# Patient Record
Sex: Female | Born: 1990 | Race: Black or African American | Hispanic: No | Marital: Single | Smoking: Never smoker
Health system: Southern US, Community
[De-identification: ages and names within clinical notes are randomized; demographics above are authoritative.]

## PROBLEM LIST (undated history)

## (undated) ENCOUNTER — Inpatient Hospital Stay (HOSPITAL_COMMUNITY): Payer: Self-pay

## (undated) DIAGNOSIS — D649 Anemia, unspecified: Secondary | ICD-10-CM

## (undated) DIAGNOSIS — F322 Major depressive disorder, single episode, severe without psychotic features: Secondary | ICD-10-CM

## (undated) DIAGNOSIS — Z801 Family history of malignant neoplasm of trachea, bronchus and lung: Secondary | ICD-10-CM

## (undated) DIAGNOSIS — F411 Generalized anxiety disorder: Secondary | ICD-10-CM

## (undated) DIAGNOSIS — Z800 Family history of malignant neoplasm of digestive organs: Secondary | ICD-10-CM

## (undated) DIAGNOSIS — I10 Essential (primary) hypertension: Secondary | ICD-10-CM

## (undated) DIAGNOSIS — Z803 Family history of malignant neoplasm of breast: Secondary | ICD-10-CM

## (undated) DIAGNOSIS — A749 Chlamydial infection, unspecified: Secondary | ICD-10-CM

## (undated) DIAGNOSIS — A549 Gonococcal infection, unspecified: Secondary | ICD-10-CM

## (undated) HISTORY — DX: Gonococcal infection, unspecified: A54.9

## (undated) HISTORY — DX: Major depressive disorder, single episode, severe without psychotic features: F32.2

## (undated) HISTORY — DX: Essential (primary) hypertension: I10

## (undated) HISTORY — DX: Family history of malignant neoplasm of trachea, bronchus and lung: Z80.1

## (undated) HISTORY — PX: CERVIX SURGERY: SHX593

## (undated) HISTORY — DX: Family history of malignant neoplasm of breast: Z80.3

## (undated) HISTORY — DX: Family history of malignant neoplasm of digestive organs: Z80.0

## (undated) HISTORY — DX: Chlamydial infection, unspecified: A74.9

## (undated) HISTORY — PX: NO PAST SURGERIES: SHX2092

## (undated) HISTORY — DX: Generalized anxiety disorder: F41.1

---

## 2014-12-07 ENCOUNTER — Inpatient Hospital Stay (HOSPITAL_COMMUNITY): Payer: Self-pay

## 2014-12-07 ENCOUNTER — Inpatient Hospital Stay (HOSPITAL_COMMUNITY)
Admission: AD | Admit: 2014-12-07 | Discharge: 2014-12-07 | Disposition: A | Discharge status: Home / Self Care | Payer: Self-pay | Source: Ambulatory Visit | Attending: Obstetrics and Gynecology | Admitting: Obstetrics and Gynecology

## 2014-12-07 ENCOUNTER — Encounter (HOSPITAL_COMMUNITY): Payer: Self-pay | Admitting: *Deleted

## 2014-12-07 DIAGNOSIS — O039 Complete or unspecified spontaneous abortion without complication: Secondary | ICD-10-CM | POA: Insufficient documentation

## 2014-12-07 DIAGNOSIS — Z3A08 8 weeks gestation of pregnancy: Secondary | ICD-10-CM | POA: Insufficient documentation

## 2014-12-07 DIAGNOSIS — O209 Hemorrhage in early pregnancy, unspecified: Secondary | ICD-10-CM

## 2014-12-07 HISTORY — DX: Anemia, unspecified: D64.9

## 2014-12-07 LAB — CBC
HEMATOCRIT: 35.9 % — AB (ref 36.0–46.0)
Hemoglobin: 11.6 g/dL — ABNORMAL LOW (ref 12.0–15.0)
MCH: 23.3 pg — ABNORMAL LOW (ref 26.0–34.0)
MCHC: 32.3 g/dL (ref 30.0–36.0)
MCV: 72.2 fL — ABNORMAL LOW (ref 78.0–100.0)
PLATELETS: 288 10*3/uL (ref 150–400)
RBC: 4.97 MIL/uL (ref 3.87–5.11)
RDW: 15.5 % (ref 11.5–15.5)
WBC: 11.6 10*3/uL — AB (ref 4.0–10.5)

## 2014-12-07 LAB — WET PREP, GENITAL
TRICH WET PREP: NONE SEEN
Yeast Wet Prep HPF POC: NONE SEEN

## 2014-12-07 LAB — URINALYSIS, ROUTINE W REFLEX MICROSCOPIC
Bilirubin Urine: NEGATIVE
Glucose, UA: NEGATIVE mg/dL
KETONES UR: 15 mg/dL — AB
LEUKOCYTES UA: NEGATIVE
NITRITE: NEGATIVE
Protein, ur: NEGATIVE mg/dL
Urobilinogen, UA: 1 mg/dL (ref 0.0–1.0)
pH: 6 (ref 5.0–8.0)

## 2014-12-07 LAB — BASIC METABOLIC PANEL
Anion gap: 6 (ref 5–15)
BUN: 9 mg/dL (ref 6–23)
CALCIUM: 9.5 mg/dL (ref 8.4–10.5)
CHLORIDE: 104 meq/L (ref 96–112)
CO2: 27 mmol/L (ref 19–32)
Creatinine, Ser: 0.7 mg/dL (ref 0.50–1.10)
GFR calc Af Amer: >90 mL/min (ref >90)
GLUCOSE: 94 mg/dL (ref 70–99)
POTASSIUM: 4.3 mmol/L (ref 3.5–5.1)
Sodium: 137 mmol/L (ref 135–145)

## 2014-12-07 LAB — URINE MICROSCOPIC-ADD ON

## 2014-12-07 LAB — HCG, QUANTITATIVE, PREGNANCY: HCG, BETA CHAIN, QUANT, S: 11624 m[IU]/mL — AB (ref <5)

## 2014-12-07 LAB — POCT PREGNANCY, URINE: Preg Test, Ur: POSITIVE — AB

## 2014-12-07 MED ORDER — PROMETHAZINE HCL 12.5 MG PO TABS: 12.5000 mg | ORAL_TABLET | Freq: Four times a day (QID) | ORAL | Status: DC | PRN

## 2014-12-07 MED ORDER — OXYCODONE-ACETAMINOPHEN 5-325 MG PO TABS: 1.0000 | ORAL_TABLET | Freq: Four times a day (QID) | ORAL | Status: DC | PRN

## 2014-12-07 MED ORDER — OXYCODONE-ACETAMINOPHEN 5-325 MG PO TABS
ORAL_TABLET | ORAL | Status: AC
  Filled 2014-12-07: qty 1

## 2014-12-07 MED ORDER — IBUPROFEN 800 MG PO TABS: 800.0000 mg | ORAL_TABLET | Freq: Three times a day (TID) | ORAL | Status: DC

## 2014-12-07 MED ORDER — OXYCODONE-ACETAMINOPHEN 5-325 MG PO TABS
1.0000 | ORAL_TABLET | Freq: Once | ORAL | Status: AC
  Administered 2014-12-07: 1 via ORAL

## 2014-12-07 NOTE — MAU Note (Signed)
Started spotting yesterday, today has gotten heavier. On going cramping.

## 2014-12-07 NOTE — Discharge Instructions (Signed)
Incomplete Miscarriage A miscarriage is the sudden loss of an unborn baby (fetus) before the 20th week of pregnancy. In an incomplete miscarriage, parts of the fetus or placenta (afterbirth) remain in the body.  Having a miscarriage can be an emotional experience. Talk with your health care provider about any questions you may have about miscarrying, the grieving process, and your future pregnancy plans. CAUSES   Problems with the fetal chromosomes that make it impossible for the baby to develop normally. Problems with the baby's genes or chromosomes are most often the result of errors that occur by chance as the embryo divides and grows. The problems are not inherited from the parents.  Infection of the cervix or uterus.  Hormone problems.  Problems with the cervix, such as having an incompetent cervix. This is when the tissue in the cervix is not strong enough to hold the pregnancy.  Problems with the uterus, such as an abnormally shaped uterus, uterine fibroids, or congenital abnormalities.  Certain medical conditions.  Smoking, drinking alcohol, or taking illegal drugs.  Trauma. SYMPTOMS   Vaginal bleeding or spotting, with or without cramps or pain.  Pain or cramping in the abdomen or lower back.  Passing fluid, tissue, or blood clots from the vagina. DIAGNOSIS  Your health care provider will perform a physical exam. You may also have an ultrasound to confirm the miscarriage. Blood or urine tests may also be ordered. TREATMENT   Usually, a dilation and curettage (D&C) procedure is performed. During a D&C procedure, the cervix is widened (dilated) and any remaining fetal or placental tissue is gently removed from the uterus.  Antibiotic medicines are prescribed if there is an infection. Other medicines may be given to reduce the size of the uterus (contract) if there is a lot of bleeding.  If you have Rh negative blood and your baby was Rh positive, you will need a Rho (D)  immune globulin shot. This shot will protect any future baby from having Rh blood problems in future pregnancies.  You may be confined to bed rest. This means you should stay in bed and only get up to use the bathroom. HOME CARE INSTRUCTIONS   Rest as directed by your health care provider.  Restrict activity as directed by your health care provider. You may be allowed to continue light activity if curettage was not done but you require further treatment.  Keep track of the number of pads you use each day. Keep track of how soaked (saturated) they are. Record this information.  Do not  use tampons.  Do not douche or have sexual intercourse until approved by your health care provider.  Keep all follow-up appointments for reevaluation and continuing management.  Only take over-the-counter or prescription medicines for pain, fever, or discomfort as directed by your health care provider.  Take antibiotic medicine as directed by your health care provider. Make sure you finish it even if you start to feel better. SEEK IMMEDIATE MEDICAL CARE IF:   You experience severe cramps in your stomach, back, or abdomen.  You have an unexplained temperature (make sure to record these temperatures).  You pass large clots or tissue (save these for your health care provider to inspect).  Your bleeding increases.  You become light-headed, weak, or have fainting episodes. MAKE SURE YOU:   Understand these instructions.  Will watch your condition.  Will get help right away if you are not doing well or get worse. Document Released: 11/20/2005 Document Revised: 04/06/2014 Document Reviewed:   06/19/2013 Exit[employer] Patient Information 2015 [employer]. This information is not intended to replace advice given to you by your health care provider. Make sure you discuss any questions you have with your health care provider.  

## 2014-12-07 NOTE — MAU Provider Note (Signed)
History     CSN: [id number]  Arrival date and time: 12/07/14 1647   First Provider Initiated Contact with Patient 12/07/14 1859      Chief Complaint  Patient presents with  . Vaginal Bleeding  . Abdominal Cramping  . Possible Pregnancy   HPI  [name] is a 24 y.o. G1P0 at [redacted]w[redacted]d who presents to MAU today with complaint of +HPT, lower abdominal pain and vaginal bleeding. The patient states that abdominal pain has been constant since she found out she was pregnant. She states worsening of pain today. She rates pain at 5/10 now. She states vaginal bleeding started today. She is wearing a pad and states bleeding is slightly less than a period. She also endorses a white discharge with odor. She has occasional nausea without vomiting or diarrhea. She states occasional constipation with last BM yesterday. She denies dysuria or fever.   OB History    Gravida Para Term Preterm AB TAB SAB Ectopic Multiple Living   1         0      Past Medical History  Diagnosis Date  . Anemia     Past Surgical History  Procedure Laterality Date  . No past surgeries      History reviewed. No pertinent family history.  History  Substance Use Topics  . Smoking status: Never Smoker   . Smokeless tobacco: Not on file  . Alcohol Use: Yes     Comment: soccially    Allergies: No Known Allergies  Prescriptions prior to admission  Medication Sig Dispense Refill Last Dose  . acetaminophen (TYLENOL) 500 MG tablet Take 500 mg by mouth every 6 (six) hours as needed for headache.   Past Week at Unknown time  . Prenatal Vit-Fe Fumarate-FA (PRENATAL MULTIVITAMIN) TABS tablet Take 1 tablet by mouth daily at 12 noon.   12/07/2014 at Unknown time    Review of Systems  Constitutional: Negative for fever and malaise/fatigue.  Gastrointestinal: Positive for nausea, abdominal pain and constipation. Negative for vomiting and diarrhea.  Genitourinary: Negative for dysuria, urgency and frequency.       +  vaginal bleeding, discharge   Physical Exam   Blood pressure 140/80, pulse 85, temperature 99.1 F (37.3 C), temperature source Oral, resp. rate 18, weight 155 lb (70.308 kg), last menstrual period 10/11/2014.  Physical Exam  Constitutional: She is oriented to person, place, and time. She appears well-developed and well-nourished. No distress.  HENT:  Head: Normocephalic.  Cardiovascular: Normal rate.   Respiratory: Effort normal.  GI: Soft. Bowel sounds are normal. She exhibits no distension and no mass. There is tenderness (mild tenderness to palpation of the lower abdomen). There is no rebound and no guarding.  Genitourinary: Uterus is enlarged (slightly) and tender (mild). Cervix exhibits no motion tenderness, no discharge and no friability. Right adnexum displays no mass and no tenderness. Left adnexum displays no mass and no tenderness. There is bleeding (small amount of blood in the vaginal vault) in the vagina. No vaginal discharge found.  Neurological: She is alert and oriented to person, place, and time.  Skin: Skin is warm and dry. No erythema.  Psychiatric: She has a normal mood and affect.   Results for orders placed or performed during the hospital encounter of 12/07/14 (from the past 24 hour(s))  Pregnancy, urine POC     Status: Abnormal   Collection Time: 12/07/14  5:29 PM  Result Value Ref Range   Preg Test, Ur POSITIVE (A) NEGATIVE  Urinalysis, Routine w reflex microscopic     Status: Abnormal   Collection Time: 12/07/14  5:30 PM  Result Value Ref Range   Color, Urine YELLOW YELLOW   APPearance CLEAR CLEAR   Specific Gravity, Urine >1.030 (H) 1.005 - 1.030   pH 6.0 5.0 - 8.0   Glucose, UA NEGATIVE NEGATIVE mg/dL   Hgb urine dipstick MODERATE (A) NEGATIVE   Bilirubin Urine NEGATIVE NEGATIVE   Ketones, ur 15 (A) NEGATIVE mg/dL   Protein, ur NEGATIVE NEGATIVE mg/dL   Urobilinogen, UA 1.0 0.0 - 1.0 mg/dL   Nitrite NEGATIVE NEGATIVE   Leukocytes, UA NEGATIVE  NEGATIVE  Urine microscopic-add on     Status: Abnormal   Collection Time: 12/07/14  5:30 PM  Result Value Ref Range   Squamous Epithelial / LPF FEW (A) RARE   WBC, UA 0-2 <3 WBC/hpf   RBC / HPF 0-2 <3 RBC/hpf   Crystals CA OXALATE CRYSTALS (A) NEGATIVE   Urine-Other MUCOUS PRESENT   hCG, quantitative, pregnancy     Status: Abnormal   Collection Time: 12/07/14  6:39 PM  Result Value Ref Range   hCG, Beta Chain, Quant, S 11624 (H) <5 mIU/mL  CBC     Status: Abnormal   Collection Time: 12/07/14  6:40 PM  Result Value Ref Range   WBC 11.6 (H) 4.0 - 10.5 K/uL   RBC 4.97 3.87 - 5.11 MIL/uL   Hemoglobin 11.6 (L) 12.0 - 15.0 g/dL   HCT 29.5 (L) 28.4 - 13.2 %   MCV 72.2 (L) 78.0 - 100.0 fL   MCH 23.3 (L) 26.0 - 34.0 pg   MCHC 32.3 30.0 - 36.0 g/dL   RDW 44.0 10.2 - 72.5 %   Platelets 288 150 - 400 K/uL  Basic metabolic panel     Status: None   Collection Time: 12/07/14  6:40 PM  Result Value Ref Range   Sodium 137 135 - 145 mmol/L   Potassium 4.3 3.5 - 5.1 mmol/L   Chloride 104 96 - 112 mEq/L   CO2 27 19 - 32 mmol/L   Glucose, Bld 94 70 - 99 mg/dL   BUN 9 6 - 23 mg/dL   Creatinine, Ser 3.66 0.50 - 1.10 mg/dL   Calcium 9.5 8.4 - 44.0 mg/dL   GFR calc non Af Amer >90 >90 mL/min   GFR calc Af Amer >90 >90 mL/min   Anion gap 6 5 - 15  Wet prep, genital     Status: Abnormal   Collection Time: 12/07/14  7:14 PM  Result Value Ref Range   Yeast Wet Prep HPF POC NONE SEEN NONE SEEN   Trich, Wet Prep NONE SEEN NONE SEEN   Clue Cells Wet Prep HPF POC FEW (A) NONE SEEN   WBC, Wet Prep HPF POC MODERATE (A) NONE SEEN   US Ob Comp Less 14 Wks  12/07/2014   CLINICAL DATA:  Acute onset of vaginal bleeding.  Initial encounter.  EXAM: OBSTETRIC <14 WK [place] AND TRANSVAGINAL OB US  TECHNIQUE: Both transabdominal and transvaginal ultrasound examinations were performed for complete evaluation of the gestation as well as the maternal uterus, adnexal regions, and pelvic cul-de-sac. Transvaginal technique  was performed to assess early pregnancy.  COMPARISON:  None.  FINDINGS: Intrauterine gestational sac: Visualized/normal in shape. The gestational sac is seen at the lower uterine segment.  Yolk sac:  Yes  Embryo:  Yes  Cardiac Activity: No  Heart Rate: N/A  CRL:   1.0 cm  7 w 1 d                  [place] EDC: 07/25/2015  Maternal uterus/adnexae: No subchorionic hemorrhage is noted. The uterus is otherwise unremarkable in appearance.  The ovaries are grossly unremarkable. The right ovary measures 2.6 x 2.4 x 2.3 cm, while the left ovary measures 2.7 x 1.7 x 1.8 cm. No suspicious adnexal masses are seen; there is no evidence for ovarian torsion.  No free fluid is seen within the pelvic cul-de-sac.  IMPRESSION: Single intrauterine gestational sac noted. The gestational sac is seen at the lower uterine segment, compatible with spontaneous abortion in progress. No cardiac activity is visualized, reflecting fetal demise.   Electronically Signed   By: Roanna Raider M.D.   On: 12/07/2014 20:13   US Ob Transvaginal  12/07/2014   CLINICAL DATA:  Acute onset of vaginal bleeding.  Initial encounter.  EXAM: OBSTETRIC <14 WK [place] AND TRANSVAGINAL OB US  TECHNIQUE: Both transabdominal and transvaginal ultrasound examinations were performed for complete evaluation of the gestation as well as the maternal uterus, adnexal regions, and pelvic cul-de-sac. Transvaginal technique was performed to assess early pregnancy.  COMPARISON:  None.  FINDINGS: Intrauterine gestational sac: Visualized/normal in shape. The gestational sac is seen at the lower uterine segment.  Yolk sac:  Yes  Embryo:  Yes  Cardiac Activity: No  Heart Rate: N/A  CRL:   1.0 cm   7 w 1 d                  [place] EDC: 07/25/2015  Maternal uterus/adnexae: No subchorionic hemorrhage is noted. The uterus is otherwise unremarkable in appearance.  The ovaries are grossly unremarkable. The right ovary measures 2.6 x 2.4 x 2.3 cm, while the left ovary measures 2.7 x 1.7 x 1.8 cm. No  suspicious adnexal masses are seen; there is no evidence for ovarian torsion.  No free fluid is seen within the pelvic cul-de-sac.  IMPRESSION: Single intrauterine gestational sac noted. The gestational sac is seen at the lower uterine segment, compatible with spontaneous abortion in progress. No cardiac activity is visualized, reflecting fetal demise.   Electronically Signed   By: Roanna Raider M.D.   On: 12/07/2014 20:13    MAU Course  Procedures None  MDM +UPT UA, wet prep, GC/Chlamydia, CBC, ABO/Rh, quant hCG, HIV, RPR and [place] today Discussed results with patient as well as options for expectant management vs Cytotec vs D&C.  Patient prefers expectant management at this time.  Assessment and Plan  A: SAB in progress  P: Discharge home Rx for Percocet, Phenergan and Ibuprofen given to patient Bleeding precautions discussed Patient referred to [facility] for follow-up in 2 weeks. They will call with appt date/time.  Patient may return to MAU as needed or if her condition were to change or worsen   Marny Lowenstein, PA-C  12/07/2014, 8:53 PM

## 2014-12-08 LAB — HIV ANTIBODY (ROUTINE TESTING W REFLEX): HIV 1&2 Ab, 4th Generation: NONREACTIVE

## 2014-12-08 LAB — RPR

## 2014-12-09 LAB — GC/CHLAMYDIA PROBE AMP
CT Probe RNA: NEGATIVE
GC Probe RNA: NEGATIVE

## 2014-12-24 ENCOUNTER — Telehealth: Payer: Self-pay | Admitting: General Practice

## 2014-12-24 ENCOUNTER — Telehealth: Payer: Self-pay | Admitting: Family Medicine

## 2014-12-24 NOTE — Telephone Encounter (Signed)
Called patient informed her that office would be closed and that we would be calling her back with new appointment.

## 2014-12-24 NOTE — Telephone Encounter (Signed)
Called patient informing her that our office is going to be closed tomorrow however we have already rescheduled her appt for Monday the 25th at 2pm. Patient verbalized understanding and states she cannot do that time but needs something first thing in the morning. Informed patient of new appt of 1/25 @ 745. Patient verbalized understanding and had no other questions

## 2014-12-25 ENCOUNTER — Encounter: Payer: Self-pay | Admitting: Family Medicine

## 2014-12-28 ENCOUNTER — Encounter: Payer: Self-pay | Admitting: Family Medicine

## 2015-01-27 ENCOUNTER — Encounter: Payer: Self-pay | Admitting: Obstetrics & Gynecology

## 2015-01-27 ENCOUNTER — Ambulatory Visit (INDEPENDENT_AMBULATORY_CARE_PROVIDER_SITE_OTHER): Payer: 59 | Admitting: Obstetrics & Gynecology

## 2015-01-27 VITALS — BP 134/82 | HR 74 | Temp 98.5°F | Ht 67.5 in | Wt 157.7 lb

## 2015-01-27 DIAGNOSIS — Z309 Encounter for contraceptive management, unspecified: Secondary | ICD-10-CM

## 2015-01-27 DIAGNOSIS — Z3002 Counseling and instruction in natural family planning to avoid pregnancy: Secondary | ICD-10-CM

## 2015-01-27 MED ORDER — ETONOGESTREL-ETHINYL ESTRADIOL 0.12-0.015 MG/24HR VA RING: VAGINAL_RING | VAGINAL | Status: DC

## 2015-01-27 NOTE — Patient Instructions (Signed)
Contraception Choices Contraception (birth control) is the use of any methods or devices to prevent pregnancy. Below are some methods to help avoid pregnancy. HORMONAL METHODS   Contraceptive implant. This is a thin, plastic tube containing progesterone hormone. It does not contain estrogen hormone. Your health care provider inserts the tube in the inner part of the upper arm. The tube can remain in place for up to 3 years. After 3 years, the implant must be removed. The implant prevents the ovaries from releasing an egg (ovulation), thickens the cervical mucus to prevent sperm from entering the uterus, and thins the lining of the inside of the uterus.  Progesterone-only injections. These injections are given every 3 months by your health care provider to prevent pregnancy. This synthetic progesterone hormone stops the ovaries from releasing eggs. It also thickens cervical mucus and changes the uterine lining. This makes it harder for sperm to survive in the uterus.  Birth control pills. These pills contain estrogen and progesterone hormone. They work by preventing the ovaries from releasing eggs (ovulation). They also cause the cervical mucus to thicken, preventing the sperm from entering the uterus. Birth control pills are prescribed by a health care provider.Birth control pills can also be used to treat heavy periods.  Minipill. This type of birth control pill contains only the progesterone hormone. They are taken every day of each month and must be prescribed by your health care provider.  Birth control patch. The patch contains hormones similar to those in birth control pills. It must be changed once a week and is prescribed by a health care provider.  Vaginal ring. The ring contains hormones similar to those in birth control pills. It is left in the vagina for 3 weeks, removed for 1 week, and then a new one is put back in place. The patient must be comfortable inserting and removing the ring  from the vagina.A health care provider's prescription is necessary.  Emergency contraception. Emergency contraceptives prevent pregnancy after unprotected sexual intercourse. This pill can be taken right after sex or up to 5 days after unprotected sex. It is most effective the sooner you take the pills after having sexual intercourse. Most emergency contraceptive pills are available without a prescription. Check with your pharmacist. Do not use emergency contraception as your only form of birth control. BARRIER METHODS   Female condom. This is a thin sheath (latex or rubber) that is worn over the penis during sexual intercourse. It can be used with spermicide to increase effectiveness.  Female condom. This is a soft, loose-fitting sheath that is put into the vagina before sexual intercourse.  Diaphragm. This is a soft, latex, dome-shaped barrier that must be fitted by a health care provider. It is inserted into the vagina, along with a spermicidal jelly. It is inserted before intercourse. The diaphragm should be left in the vagina for 6 to 8 hours after intercourse.  Cervical cap. This is a round, soft, latex or plastic cup that fits over the cervix and must be fitted by a health care provider. The cap can be left in place for up to 48 hours after intercourse.  Sponge. This is a soft, circular piece of polyurethane foam. The sponge has spermicide in it. It is inserted into the vagina after wetting it and before sexual intercourse.  Spermicides. These are chemicals that kill or block sperm from entering the cervix and uterus. They come in the form of creams, jellies, suppositories, foam, or tablets. They do not require a   prescription. They are inserted into the vagina with an applicator before having sexual intercourse. The process must be repeated every time you have sexual intercourse. INTRAUTERINE CONTRACEPTION  Intrauterine device (IUD). This is a T-shaped device that is put in a woman's uterus  during a menstrual period to prevent pregnancy. There are 2 types:  Copper IUD. This type of IUD is wrapped in copper wire and is placed inside the uterus. Copper makes the uterus and fallopian tubes produce a fluid that kills sperm. It can stay in place for 10 years.  Hormone IUD. This type of IUD contains the hormone progestin (synthetic progesterone). The hormone thickens the cervical mucus and prevents sperm from entering the uterus, and it also thins the uterine lining to prevent implantation of a fertilized egg. The hormone can weaken or kill the sperm that get into the uterus. It can stay in place for 3-5 years, depending on which type of IUD is used. PERMANENT METHODS OF CONTRACEPTION  Female tubal ligation. This is when the woman's fallopian tubes are surgically sealed, tied, or blocked to prevent the egg from traveling to the uterus.  Hysteroscopic sterilization. This involves placing a small coil or insert into each fallopian tube. Your doctor uses a technique called hysteroscopy to do the procedure. The device causes scar tissue to form. This results in permanent blockage of the fallopian tubes, so the sperm cannot fertilize the egg. It takes about 3 months after the procedure for the tubes to become blocked. You must use another form of birth control for these 3 months.  Female sterilization. This is when the female has the tubes that carry sperm tied off (vasectomy).This blocks sperm from entering the vagina during sexual intercourse. After the procedure, the man can still ejaculate fluid (semen). NATURAL PLANNING METHODS  Natural family planning. This is not having sexual intercourse or using a barrier method (condom, diaphragm, cervical cap) on days the woman could become pregnant.  Calendar method. This is keeping track of the length of each menstrual cycle and identifying when you are fertile.  Ovulation method. This is avoiding sexual intercourse during ovulation.  Symptothermal  method. This is avoiding sexual intercourse during ovulation, using a thermometer and ovulation symptoms.  Post-ovulation method. This is timing sexual intercourse after you have ovulated. Regardless of which type or method of contraception you choose, it is important that you use condoms to protect against the transmission of sexually transmitted infections (STIs). Talk with your health care provider about which form of contraception is most appropriate for you. Document Released: 11/20/2005 Document Revised: 11/25/2013 Document Reviewed: 05/15/2013 Exit[employer] Patient Information 2015 [employer]. This information is not intended to replace advice given to you by your health care provider. Make sure you discuss any questions you have with your health care provider.  

## 2015-01-28 NOTE — Progress Notes (Signed)
Subjective:     Patient ID: Kim LeavellJonaa Reeves, female   DOB: January 09, 1991, 24 y.o.   MRN: [id number]  HPI Pt s/p SAB in 12/2014.  She reports that she has no sx. Her bleeding has improved and she has no pain.  She was prev on Nuvaring with no problems and would like to restart that.    Review of Systems     Objective:   Physical Exam BP 134/82 mmHg  Pulse 74  Temp(Src) 98.5 F (36.9 C)  Ht 5' 7.5" (1.715 m)  Wt 157 lb 11.2 oz (71.532 kg)  BMI 24.32 kg/m2  LMP 01/18/2015  Breastfeeding? Unknown Abd: soft, NT; ND     Assessment:     S/p SAB Contraception management- wants to restart Nuvaring       Plan:     Nuvaring- reviewed use F/u 1 year or sooner prn

## 2015-04-19 ENCOUNTER — Encounter (HOSPITAL_COMMUNITY): Payer: Self-pay | Admitting: Emergency Medicine

## 2015-04-19 ENCOUNTER — Emergency Department (HOSPITAL_COMMUNITY)
Admission: EM | Admit: 2015-04-19 | Discharge: 2015-04-19 | Disposition: A | Discharge status: Home / Self Care | Payer: 59 | Attending: Emergency Medicine | Admitting: Emergency Medicine

## 2015-04-19 ENCOUNTER — Emergency Department (HOSPITAL_COMMUNITY): Payer: 59

## 2015-04-19 DIAGNOSIS — Z791 Long term (current) use of non-steroidal anti-inflammatories (NSAID): Secondary | ICD-10-CM | POA: Insufficient documentation

## 2015-04-19 DIAGNOSIS — Y9389 Activity, other specified: Secondary | ICD-10-CM | POA: Insufficient documentation

## 2015-04-19 DIAGNOSIS — S301XXA Contusion of abdominal wall, initial encounter: Secondary | ICD-10-CM

## 2015-04-19 DIAGNOSIS — S99921A Unspecified injury of right foot, initial encounter: Secondary | ICD-10-CM | POA: Diagnosis not present

## 2015-04-19 DIAGNOSIS — S3991XA Unspecified injury of abdomen, initial encounter: Secondary | ICD-10-CM | POA: Diagnosis present

## 2015-04-19 DIAGNOSIS — Z79899 Other long term (current) drug therapy: Secondary | ICD-10-CM | POA: Diagnosis not present

## 2015-04-19 DIAGNOSIS — S8991XA Unspecified injury of right lower leg, initial encounter: Secondary | ICD-10-CM | POA: Diagnosis not present

## 2015-04-19 DIAGNOSIS — S79911A Unspecified injury of right hip, initial encounter: Secondary | ICD-10-CM | POA: Insufficient documentation

## 2015-04-19 DIAGNOSIS — S3993XA Unspecified injury of pelvis, initial encounter: Secondary | ICD-10-CM | POA: Insufficient documentation

## 2015-04-19 DIAGNOSIS — S60511A Abrasion of right hand, initial encounter: Secondary | ICD-10-CM | POA: Diagnosis not present

## 2015-04-19 DIAGNOSIS — S40022A Contusion of left upper arm, initial encounter: Secondary | ICD-10-CM | POA: Insufficient documentation

## 2015-04-19 DIAGNOSIS — Y92410 Unspecified street and highway as the place of occurrence of the external cause: Secondary | ICD-10-CM | POA: Diagnosis not present

## 2015-04-19 DIAGNOSIS — S6991XA Unspecified injury of right wrist, hand and finger(s), initial encounter: Secondary | ICD-10-CM | POA: Insufficient documentation

## 2015-04-19 DIAGNOSIS — D649 Anemia, unspecified: Secondary | ICD-10-CM | POA: Diagnosis not present

## 2015-04-19 DIAGNOSIS — Y998 Other external cause status: Secondary | ICD-10-CM | POA: Insufficient documentation

## 2015-04-19 DIAGNOSIS — T07XXXA Unspecified multiple injuries, initial encounter: Secondary | ICD-10-CM

## 2015-04-19 LAB — COMPREHENSIVE METABOLIC PANEL
ALT: 26 U/L (ref 14–54)
AST: 41 U/L (ref 15–41)
Albumin: 3.5 g/dL (ref 3.5–5.0)
Alkaline Phosphatase: 42 U/L (ref 38–126)
Anion gap: 11 (ref 5–15)
BILIRUBIN TOTAL: 1.1 mg/dL (ref 0.3–1.2)
BUN: 8 mg/dL (ref 6–20)
CALCIUM: 9 mg/dL (ref 8.9–10.3)
CO2: 22 mmol/L (ref 22–32)
Chloride: 106 mmol/L (ref 101–111)
Creatinine, Ser: 0.96 mg/dL (ref 0.44–1.00)
GLUCOSE: 85 mg/dL (ref 65–99)
Potassium: 4.3 mmol/L (ref 3.5–5.1)
Sodium: 139 mmol/L (ref 135–145)
Total Protein: 6.9 g/dL (ref 6.5–8.1)

## 2015-04-19 LAB — CBC WITH DIFFERENTIAL/PLATELET
BASOS PCT: 0 % (ref 0–1)
Basophils Absolute: 0 10*3/uL (ref 0.0–0.1)
EOS PCT: 0 % (ref 0–5)
Eosinophils Absolute: 0 10*3/uL (ref 0.0–0.7)
HEMATOCRIT: 40.1 % (ref 36.0–46.0)
Hemoglobin: 12.9 g/dL (ref 12.0–15.0)
LYMPHS ABS: 2 10*3/uL (ref 0.7–4.0)
LYMPHS PCT: 18 % (ref 12–46)
MCH: 22.8 pg — ABNORMAL LOW (ref 26.0–34.0)
MCHC: 32.2 g/dL (ref 30.0–36.0)
MCV: 70.7 fL — ABNORMAL LOW (ref 78.0–100.0)
MONOS PCT: 8 % (ref 3–12)
Monocytes Absolute: 0.9 10*3/uL (ref 0.1–1.0)
Neutro Abs: 8.2 10*3/uL — ABNORMAL HIGH (ref 1.7–7.7)
Neutrophils Relative %: 74 % (ref 43–77)
Platelets: 275 10*3/uL (ref 150–400)
RBC: 5.67 MIL/uL — ABNORMAL HIGH (ref 3.87–5.11)
RDW: 14.3 % (ref 11.5–15.5)
WBC: 11.1 10*3/uL — ABNORMAL HIGH (ref 4.0–10.5)

## 2015-04-19 LAB — PROTIME-INR
INR: 1.06 (ref 0.00–1.49)
Prothrombin Time: 13.9 seconds (ref 11.6–15.2)

## 2015-04-19 LAB — I-STAT CREATININE, ED: Creatinine, Ser: 1 mg/dL (ref 0.44–1.00)

## 2015-04-19 MED ORDER — ONDANSETRON HCL 4 MG/2ML IJ SOLN
4.0000 mg | Freq: Once | INTRAMUSCULAR | Status: AC
Start: 2015-04-19 — End: 2015-04-19
  Administered 2015-04-19: 4 mg via INTRAVENOUS
  Filled 2015-04-19: qty 2

## 2015-04-19 MED ORDER — TETRACAINE HCL 0.5 % OP SOLN
1.0000 [drp] | Freq: Once | OPHTHALMIC | Status: DC
  Filled 2015-04-19: qty 2

## 2015-04-19 MED ORDER — HYDROCODONE-ACETAMINOPHEN 5-325 MG PO TABS: 1.0000 | ORAL_TABLET | Freq: Four times a day (QID) | ORAL | Status: DC | PRN

## 2015-04-19 MED ORDER — MORPHINE SULFATE 4 MG/ML IJ SOLN
4.0000 mg | Freq: Once | INTRAMUSCULAR | Status: AC
Start: 2015-04-19 — End: 2015-04-19
  Administered 2015-04-19: 4 mg via INTRAVENOUS
  Filled 2015-04-19: qty 1

## 2015-04-19 MED ORDER — FLUORESCEIN SODIUM 1 MG OP STRP
1.0000 | ORAL_STRIP | Freq: Once | OPHTHALMIC | Status: DC
  Filled 2015-04-19: qty 1

## 2015-04-19 MED ORDER — IOHEXOL 300 MG/ML  SOLN
100.0000 mL | Freq: Once | INTRAMUSCULAR | Status: AC | PRN
  Administered 2015-04-19: 100 mL via INTRAVENOUS
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
## 2015-04-19 NOTE — ED Notes (Signed)
MD Criss AlvineGoldston at bedside. Pt monitored by pulse ox, bp cuff, and 5-lead.
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
## 2015-04-19 NOTE — ED Notes (Signed)
Pt feels like there is something/glass in left eye. Orders received

## 2015-04-19 NOTE — Discharge Instructions (Signed)
Abrasion An abrasion is a cut or scrape of the skin. Abrasions do not extend through all layers of the skin and most heal within 10 days. It is important to care for your abrasion properly to prevent infection. CAUSES  Most abrasions are caused by falling on, or gliding across, the ground or other surface. When your skin rubs on something, the outer and inner layer of skin rubs off, causing an abrasion. DIAGNOSIS  Your caregiver will be able to diagnose an abrasion during a physical exam.  TREATMENT  Your treatment depends on how large and deep the abrasion is. Generally, your abrasion will be cleaned with water and a mild soap to remove any dirt or debris. An antibiotic ointment may be put over the abrasion to prevent an infection. A bandage (dressing) may be wrapped around the abrasion to keep it from getting dirty.  You may need a tetanus shot if:  You cannot remember when you had your last tetanus shot.  You have never had a tetanus shot.  The injury broke your skin. If you get a tetanus shot, your arm may swell, get red, and feel warm to the touch. This is common and not a problem. If you need a tetanus shot and you choose not to have one, there is a rare chance of getting tetanus. Sickness from tetanus can be serious.  HOME CARE INSTRUCTIONS   If a dressing was applied, change it at least once a day or as directed by your caregiver. If the bandage sticks, soak it off with warm water.   Wash the area with water and a mild soap to remove all the ointment 2 times a day. Rinse off the soap and pat the area dry with a clean towel.   Reapply any ointment as directed by your caregiver. This will help prevent infection and keep the bandage from sticking. Use gauze over the wound and under the dressing to help keep the bandage from sticking.   Change your dressing right away if it becomes wet or dirty.   Only take over-the-counter or prescription medicines for pain, discomfort, or fever as  directed by your caregiver.   Follow up with your caregiver within 24-48 hours for a wound check, or as directed. If you were not given a wound-check appointment, look closely at your abrasion for redness, swelling, or pus. These are signs of infection. SEEK IMMEDIATE MEDICAL CARE IF:   You have increasing pain in the wound.   You have redness, swelling, or tenderness around the wound.   You have pus coming from the wound.   You have a fever or persistent symptoms for more than 2-3 days.  You have a fever and your symptoms suddenly get worse.  You have a bad smell coming from the wound or dressing.  MAKE SURE YOU:   Understand these instructions.  Will watch your condition.  Will get help right away if you are not doing well or get worse. Document Released: 08/30/2005 Document Revised: 11/06/2012 Document Reviewed: 10/24/2011 [facility] Patient Information 2015 [place], [place]. This information is not intended to replace advice given to you by your health care provider. Make sure you discuss any questions you have with your health care provider.     Blunt Abdominal Trauma A blunt injury to the abdomen can cause pain. The pain is most likely from bruising and stretching of your muscles. This pain is often made worse with movement. Most often these injuries are not serious and get better  within 1 week with rest and mild pain medicine. However, internal organs (liver, spleen, kidneys) can be injured with blunt trauma. If you do not get better or if you get worse, further examination may be needed. Continue with your regular daily activities, but avoid any strenuous activities until your pain is improved. If your stomach is upset, stick to a clear liquid diet and slowly advance to solid food.  SEEK IMMEDIATE MEDICAL CARE IF:   You develop increasing pain, nausea, or repeated vomiting.  You develop chest pain or breathing difficulty.  You develop blood in the urine, vomit, or  stool.  You develop weakness, fainting, fever, or other serious complaints. Document Released: 12/28/2004 Document Revised: 02/12/2012 Document Reviewed: 04/15/2009 [facility] Patient Information 2015 [place], [place]. This information is not intended to replace advice given to you by your health care provider. Make sure you discuss any questions you have with your health care provider.     Blunt Trauma You have been evaluated for injuries. You have been examined and your caregiver has not found injuries serious enough to require hospitalization. It is common to have multiple bruises and sore muscles following an accident. These tend to feel worse for the first 24 hours. You will feel more stiffness and soreness over the next several hours and worse when you wake up the first morning after your accident. After this point, you should begin to improve with each passing day. The amount of improvement depends on the amount of damage done in the accident. Following your accident, if some part of your body does not work as it should, or if the pain in any area continues to increase, you should return to the Emergency Department for re-evaluation.  HOME CARE INSTRUCTIONS  Routine care for sore areas should include:  Ice to sore areas every 2 hours for 20 minutes while awake for the next 2 days.  Drink extra fluids (not alcohol).  Take a hot or warm shower or bath once or twice a day to increase blood flow to sore muscles. This will help you "limber up".  Activity as tolerated. Lifting may aggravate neck or back pain.  Only take over-the-counter or prescription medicines for pain, discomfort, or fever as directed by your caregiver. Do not use aspirin. This may increase bruising or increase bleeding if there are small areas where this is happening. SEEK IMMEDIATE MEDICAL CARE IF:  Numbness, tingling, weakness, or problem with the use of your arms or legs.  A severe headache is not relieved with  medications.  There is a change in bowel or bladder control.  Increasing pain in any areas of the body.  Short of breath or dizzy.  Nauseated, vomiting, or sweating.  Increasing belly (abdominal) discomfort.  Blood in urine, stool, or vomiting blood.  Pain in either shoulder in an area where a shoulder strap would be.  Feelings of lightheadedness or if you have a fainting episode. Sometimes it is not possible to identify all injuries immediately after the trauma. It is important that you continue to monitor your condition after the emergency department visit. If you feel you are not improving, or improving more slowly than should be expected, call your physician. If you feel your symptoms (problems) are worsening, return to the Emergency Department immediately. Document Released: 08/16/2001 Document Revised: 02/12/2012 Document Reviewed: 07/08/2008 [facility]ExitCare Patient Information 2015 [place], [place]. This information is not intended to replace advice given to you by your health care provider. Make sure you discuss any questions you  have with your health care provider.     Contusion A contusion is a deep bruise. Contusions are the result of an injury that caused bleeding under the skin. The contusion may turn blue, purple, or yellow. Minor injuries will give you a painless contusion, but more severe contusions may stay painful and swollen for a few weeks.  CAUSES  A contusion is usually caused by a blow, trauma, or direct force to an area of the body. SYMPTOMS   Swelling and redness of the injured area.  Bruising of the injured area.  Tenderness and soreness of the injured area.  Pain. DIAGNOSIS  The diagnosis can be made by taking a history and physical exam. An X-ray, CT scan, or MRI may be needed to determine if there were any associated injuries, such as fractures. TREATMENT  Specific treatment will depend on what area of the body was injured. In general, the best treatment  for a contusion is resting, icing, elevating, and applying cold compresses to the injured area. Over-the-counter medicines may also be recommended for pain control. Ask your caregiver what the best treatment is for your contusion. HOME CARE INSTRUCTIONS   Put ice on the injured area.  Put ice in a plastic bag.  Place a towel between your skin and the bag.  Leave the ice on for 15-20 minutes, 3-4 times a day, or as directed by your health care provider.  Only take over-the-counter or prescription medicines for pain, discomfort, or fever as directed by your caregiver. Your caregiver may recommend avoiding anti-inflammatory medicines (aspirin, ibuprofen, and naproxen) for 48 hours because these medicines may increase bruising.  Rest the injured area.  If possible, elevate the injured area to reduce swelling. SEEK IMMEDIATE MEDICAL CARE IF:   You have increased bruising or swelling.  You have pain that is getting worse.  Your swelling or pain is not relieved with medicines. MAKE SURE YOU:   Understand these instructions.  Will watch your condition.  Will get help right away if you are not doing well or get worse. Document Released: 08/30/2005 Document Revised: 11/25/2013 Document Reviewed: 09/25/2011 [facility] Patient Information 2015 [place], [place]. This information is not intended to replace advice given to you by your health care provider. Make sure you discuss any questions you have with your health care provider.

## 2015-04-21 ENCOUNTER — Encounter: Payer: Self-pay | Admitting: Nurse Practitioner

## 2015-04-21 ENCOUNTER — Encounter (INDEPENDENT_AMBULATORY_CARE_PROVIDER_SITE_OTHER): Payer: Self-pay

## 2015-04-21 ENCOUNTER — Ambulatory Visit (INDEPENDENT_AMBULATORY_CARE_PROVIDER_SITE_OTHER): Payer: 59 | Admitting: Nurse Practitioner

## 2015-04-21 VITALS — BP 126/78 | HR 87 | Temp 98.5°F | Resp 16 | Ht 67.0 in | Wt 154.8 lb

## 2015-04-21 DIAGNOSIS — R102 Pelvic and perineal pain: Secondary | ICD-10-CM

## 2015-04-21 DIAGNOSIS — N8320 Unspecified ovarian cysts: Secondary | ICD-10-CM

## 2015-04-21 DIAGNOSIS — N83209 Unspecified ovarian cyst, unspecified side: Secondary | ICD-10-CM

## 2015-04-21 DIAGNOSIS — Z7689 Persons encountering health services in other specified circumstances: Secondary | ICD-10-CM

## 2015-04-21 DIAGNOSIS — Z7189 Other specified counseling: Secondary | ICD-10-CM

## 2015-04-21 NOTE — Progress Notes (Signed)
Pre visit review using our clinic review tool, if applicable. No additional management support is needed unless otherwise documented below in the visit note. 
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
## 2015-04-21 NOTE — Patient Instructions (Addendum)
Welcome to [facility]!   Follow up in 3 months.   We will contact you about your referral to gynecology. May be a week or two before you hear from us.

## 2015-05-08 DIAGNOSIS — Z0000 Encounter for general adult medical examination without abnormal findings: Secondary | ICD-10-CM | POA: Insufficient documentation

## 2015-05-08 DIAGNOSIS — N83209 Unspecified ovarian cyst, unspecified side: Secondary | ICD-10-CM | POA: Insufficient documentation

## 2015-05-08 DIAGNOSIS — R102 Pelvic and perineal pain: Secondary | ICD-10-CM | POA: Insufficient documentation

## 2015-05-08 NOTE — Assessment & Plan Note (Addendum)
Hx of ovarian cysts. Will follow up with GYN - Referral placed. FU in 3 months

## 2015-05-08 NOTE — Assessment & Plan Note (Signed)
Discussed acute and chronic issues. Reviewed health maintenance measures, PFSHx, and immunizations. Obtain records from previous facility.   

## 2015-05-08 NOTE — Assessment & Plan Note (Addendum)
See note [facility] for more information. Followed up today. Healing well, no concerns. FU in 3 months.

## 2015-05-08 NOTE — Assessment & Plan Note (Addendum)
Referred to GYN. Pt having pelvic pain, recent miscarriage in Jan. Decrease in libido. FU in 3 months.

## 2015-09-27 ENCOUNTER — Ambulatory Visit (INDEPENDENT_AMBULATORY_CARE_PROVIDER_SITE_OTHER): Payer: 59 | Admitting: Family Medicine

## 2015-09-27 ENCOUNTER — Telehealth: Payer: Self-pay | Admitting: Nurse Practitioner

## 2015-09-27 ENCOUNTER — Encounter: Payer: Self-pay | Admitting: Family Medicine

## 2015-09-27 VITALS — BP 120/80 | HR 77 | Temp 99.5°F | Ht 67.0 in | Wt 153.2 lb

## 2015-09-27 DIAGNOSIS — J209 Acute bronchitis, unspecified: Secondary | ICD-10-CM

## 2015-09-27 MED ORDER — AZITHROMYCIN 250 MG PO TABS: ORAL_TABLET | ORAL | Status: DC

## 2015-09-27 MED ORDER — BENZONATATE 100 MG PO CAPS
100.0000 mg | ORAL_CAPSULE | Freq: Three times a day (TID) | ORAL | Status: DC | PRN
Start: 2015-09-27 — End: 2016-01-24

## 2015-09-27 NOTE — Assessment & Plan Note (Signed)
Treating with azithromycin and Tessalon Perles. 

## 2015-09-27 NOTE — Progress Notes (Signed)
   Subjective:  Patient ID: Kim Reeves, female    DOB: 1991-11-05  Age: 24 y.o. MRN: [id number]  CC: Cough  HPI:  24 year old female presents to clinic today for an acute visit with complaints of cough.  Patient reports that she's been experiencing severe productive cough for approximately 1 week.  She states that her cough is productive of yellow sputum. She's been taking over-the-counter cough medicine as well as cough medicine that she got from her friend with little relief. He reports associated chest tightness. She has had vomiting associated with the cough (posttussive). No relieving factors. No known exacerbating factors. No associated fevers, chills. No reports of shortness of breath.  Social Hx   Social History   Social History  . Marital Status: Single    Spouse Name: N/A  . Number of Children: N/A  . Years of Education: N/A   Social History Main Topics  . Smoking status: Never Smoker   . Smokeless tobacco: None  . Alcohol Use: Yes     Comment: socially  . Drug Use: No  . Sexual Activity: Yes    Birth Control/ Protection: None   Other Topics Concern  . None   Social History Narrative   Review of Systems  Constitutional: Negative for fever and chills.  Respiratory: Positive for cough and chest tightness.    Objective:  BP 120/80 mmHg  Pulse 77  Temp(Src) 99.5 F (37.5 C) (Oral)  Ht 5\' 7"  (1.702 m)  Wt 153 lb 4 oz (69.514 kg)  BMI 24.00 kg/m2  SpO2 97%  LMP 09/25/2015  BP/Weight 09/27/2015 04/21/2015 04/19/2015  Systolic BP 120 126 131  Diastolic BP 80 78 82  Wt. (Lbs) 153.25 154.8 -  BMI 24 24.24 -   Physical Exam  Constitutional: She appears well-developed. No distress.  HENT:  Head: Normocephalic and atraumatic.  Oropharynx clear. Normal TMs bilaterally.  Neck: Neck supple.  Cardiovascular: Normal rate and regular rhythm.   No murmur heard. Pulmonary/Chest: Effort normal. No respiratory distress. She has no wheezes. She has no rales.    Neurological: She is alert.  Psychiatric: She has a normal mood and affect.  Vitals reviewed.  Assessment & Plan:   Problem List Items Addressed This Visit    Acute bronchitis - Primary    Treating with azithromycin and Tessalon Perles.        Meds ordered this encounter  Medications  . azithromycin (ZITHROMAX) 250 MG tablet    Sig: 2 tablets on Day 1, then 1 tablet daily on days 2-5.    Dispense:  6 tablet    Refill:  0  . benzonatate (TESSALON) 100 MG capsule    Sig: Take 1 capsule (100 mg total) by mouth 3 (three) times daily as needed for cough.    Dispense:  30 capsule    Refill:  0    Follow-up: PRN  Everlene OtherJayce Kouper Spinella, DO

## 2015-09-27 NOTE — Telephone Encounter (Signed)
Patient Name: Kim LeavellJONAA Yera  DOB: [birth year]    Initial Comment Caller states she is having chest pain.   Nurse Assessment  Nurse: Scarlette ArStandifer, RN, Heather Date/Time (Eastern Time): 09/27/2015 10:26:12 AM  Confirm and document reason for call. If symptomatic, describe symptoms. ---Caller states that she started with a cough a week ago and now it is hurting when she coughs.  Has the patient traveled out of the country within the last 30 days? ---Not Applicable  Does the patient have any new or worsening symptoms? ---Yes  Will a triage be completed? ---Yes  Related visit to physician within the last 2 weeks? ---No  Does the PT have any chronic conditions? (i.e. diabetes, asthma, etc.) ---No  Did the patient indicate they were pregnant? ---No     Guidelines    Guideline Title Affirmed Question Affirmed Notes  Cough - Acute Productive SEVERE coughing spells (e.g., whooping sound after coughing, vomiting after coughing)    Final Disposition User   See Physician within 24 Hours Standifer, RN, Research scientist (physical sciences)Heather    Comments  Appt made with Everlene OtherJayce Cook at 4:30 this afternoon.   Referrals  REFERRED TO PCP OFFICE   Disagree/Comply: Comply

## 2015-09-27 NOTE — Telephone Encounter (Signed)
FYI your 430 pm appt today.

## 2015-09-27 NOTE — Patient Instructions (Signed)
It was nice to see you today.  Take the antibiotic as prescribed.  Use the tessalon perles as needed.  Take care  Dr. Adriana Simasook

## 2016-01-21 ENCOUNTER — Telehealth: Payer: Self-pay | Admitting: Nurse Practitioner

## 2016-01-21 NOTE — Telephone Encounter (Signed)
Needs to be seen

## 2016-01-21 NOTE — Telephone Encounter (Signed)
Please schedule for a OV thanks.

## 2016-01-21 NOTE — Telephone Encounter (Signed)
Pt called about needing a referral to go see Psychologist pt states she feels depressed and stressed. Call pt @ [phone]. Thank you!

## 2016-01-21 NOTE — Telephone Encounter (Signed)
Please advise, do you need to see her first?

## 2016-01-24 ENCOUNTER — Encounter: Payer: Self-pay | Admitting: Nurse Practitioner

## 2016-01-24 ENCOUNTER — Ambulatory Visit (INDEPENDENT_AMBULATORY_CARE_PROVIDER_SITE_OTHER): Payer: 59 | Admitting: Nurse Practitioner

## 2016-01-24 VITALS — BP 118/80 | HR 82 | Temp 98.0°F | Resp 12 | Ht 67.0 in | Wt 159.1 lb

## 2016-01-24 DIAGNOSIS — F322 Major depressive disorder, single episode, severe without psychotic features: Secondary | ICD-10-CM

## 2016-01-24 MED ORDER — CITALOPRAM HYDROBROMIDE 10 MG PO TABS: 10.0000 mg | ORAL_TABLET | Freq: Every day | ORAL | Status: DC

## 2016-01-24 NOTE — Progress Notes (Signed)
Pre visit review using our clinic review tool, if applicable. No additional management support is needed unless otherwise documented below in the visit note. 

## 2016-01-24 NOTE — Patient Instructions (Signed)
Major Depressive Disorder Major depressive disorder is a mental illness. It also may be called clinical depression or unipolar depression. Major depressive disorder usually causes feelings of sadness, hopelessness, or helplessness. Some people with this disorder do not feel particularly sad but lose interest in doing things they used to enjoy (anhedonia). Major depressive disorder also can cause physical symptoms. It can interfere with work, school, relationships, and other normal everyday activities. The disorder varies in severity but is longer lasting and more serious than the sadness we all feel from time to time in our lives. Major depressive disorder often is triggered by stressful life events or major life changes. Examples of these triggers include divorce, loss of your job or home, a move, and the death of a family member or close friend. Sometimes this disorder occurs for no obvious reason at all. People who have family members with major depressive disorder or bipolar disorder are at higher risk for developing this disorder, with or without life stressors. Major depressive disorder can occur at any age. It may occur just once in your life (single episode major depressive disorder). It may occur multiple times (recurrent major depressive disorder). SYMPTOMS People with major depressive disorder have either anhedonia or depressed mood on nearly a daily basis for at least 2 weeks or longer. Symptoms of depressed mood include:  Feelings of sadness (blue or down in the dumps) or emptiness.  Feelings of hopelessness or helplessness.  Tearfulness or episodes of crying (may be observed by others).  Irritability (children and adolescents). In addition to depressed mood or anhedonia or both, people with this disorder have at least four of the following symptoms:  Difficulty sleeping or sleeping too much.   Significant change (increase or decrease) in appetite or weight.   Lack of energy or  motivation.  Feelings of guilt and worthlessness.   Difficulty concentrating, remembering, or making decisions.  Unusually slow movement (psychomotor retardation) or restlessness (as observed by others).   Recurrent wishes for death, recurrent thoughts of self-harm (suicide), or a suicide attempt. People with major depressive disorder commonly have persistent negative thoughts about themselves, other people, and the world. People with severe major depressive disorder may experiencedistorted beliefs or perceptions about the world (psychotic delusions). They also may see or hear things that are not real (psychotic hallucinations). DIAGNOSIS Major depressive disorder is diagnosed through an assessment by your health care provider. Your health care provider will ask aboutaspects of your daily life, such as mood,sleep, and appetite, to see if you have the diagnostic symptoms of major depressive disorder. Your health care provider may ask about your medical history and use of alcohol or drugs, including prescription medicines. Your health care provider also may do a physical exam and blood work. This is because certain medical conditions and the use of certain substances can cause major depressive disorder-like symptoms (secondary depression). Your health care provider also may refer you to a mental health specialist for further evaluation and treatment. TREATMENT It is important to recognize the symptoms of major depressive disorder and seek treatment. The following treatments can be prescribed for this disorder:   Medicine. Antidepressant medicines usually are prescribed. Antidepressant medicines are thought to correct chemical imbalances in the brain that are commonly associated with major depressive disorder. Other types of medicine may be added if the symptoms do not respond to antidepressant medicines alone or if psychotic delusions or hallucinations occur.  Talk therapy. Talk therapy can be  helpful in treating major depressive disorder by providing   support, education, and guidance. Certain types of talk therapy also can help with negative thinking (cognitive behavioral therapy) and with relationship issues that trigger this disorder (interpersonal therapy). A mental health specialist can help determine which treatment is best for you. Most people with major depressive disorder do well with a combination of medicine and talk therapy. Treatments involving electrical stimulation of the brain can be used in situations with extremely severe symptoms or when medicine and talk therapy do not work over time. These treatments include electroconvulsive therapy, transcranial magnetic stimulation, and vagal nerve stimulation.   This information is not intended to replace advice given to you by your health care provider. Make sure you discuss any questions you have with your health care provider.   Document Released: 03/17/2013 Document Revised: 12/11/2014 Document Reviewed: 03/17/2013 Elsevier Interactive Patient Education 2016 Elsevier Inc.  
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
## 2016-01-25 ENCOUNTER — Encounter: Payer: Self-pay | Admitting: Nurse Practitioner

## 2016-01-25 DIAGNOSIS — F322 Major depressive disorder, single episode, severe without psychotic features: Secondary | ICD-10-CM

## 2016-01-25 HISTORY — DX: Major depressive disorder, single episode, severe without psychotic features: F32.2

## 2016-01-25 NOTE — Assessment & Plan Note (Signed)
New problem Pt has been referred to psychology  I started her on Celexa 10 mg daily after discussion about medications FU in 1 week or sooner if needed Advised about hotlines, 911, and seeking any care for if thoughts worsen or she develops a plan for suicide. She verbalized understanding. Declined seeing behavioral health in the ED for evaluation. Stop medication if thoughts worsen also- pt was made aware of this possibility and what to do.

## 2016-02-01 ENCOUNTER — Telehealth: Payer: Self-pay

## 2016-02-01 NOTE — Telephone Encounter (Signed)
FYI: Pt states that she is currently taking Celexa and that she stopped taking the medication until she can see if the counseling will work.

## 2016-02-02 NOTE — Telephone Encounter (Signed)
Okay, thank you!

## 2016-02-09 ENCOUNTER — Other Ambulatory Visit: Payer: Self-pay | Admitting: *Deleted

## 2016-02-09 DIAGNOSIS — Z3002 Counseling and instruction in natural family planning to avoid pregnancy: Secondary | ICD-10-CM

## 2016-02-09 DIAGNOSIS — Z3049 Encounter for surveillance of other contraceptives: Secondary | ICD-10-CM

## 2016-02-09 MED ORDER — ETONOGESTREL-ETHINYL ESTRADIOL 0.12-0.015 MG/24HR VA RING: VAGINAL_RING | VAGINAL | Status: DC

## 2016-02-09 NOTE — Telephone Encounter (Signed)
Received a fax from [facility] for refill of nuvaring. Per chart review has been seen within last 2 years . Will refill x 3 months, will need to see provider for further refills

## 2016-02-10 ENCOUNTER — Ambulatory Visit: Payer: 59 | Admitting: Nurse Practitioner

## 2016-10-17 ENCOUNTER — Ambulatory Visit (INDEPENDENT_AMBULATORY_CARE_PROVIDER_SITE_OTHER): Payer: 59 | Admitting: Family

## 2016-10-17 ENCOUNTER — Encounter: Payer: Self-pay | Admitting: Family

## 2016-10-17 VITALS — BP 124/86 | HR 87 | Temp 98.3°F

## 2016-10-17 DIAGNOSIS — R21 Rash and other nonspecific skin eruption: Secondary | ICD-10-CM

## 2016-10-17 MED ORDER — PREDNISONE 10 MG PO TABS: ORAL_TABLET | ORAL | 0 refills | Status: DC

## 2016-10-17 NOTE — Progress Notes (Signed)
Pre visit review using our clinic review tool, if applicable. No additional management support is needed unless otherwise documented below in the visit note. 

## 2016-10-17 NOTE — Progress Notes (Signed)
Subjective:    Patient ID: Kim Reeves Shiffman, female    DOB: 06-23-1991, 25 y.o.   MRN: [id number]  CC: Kim Reeves Testerman is a 25 y.o. female who presents today for an acute visit.    HPI: Rash for 5 days, worsening. 'All over.'  Face, arms, trunk. Few patches on legs. prior had diarrhea and vomited which continue on the day the rash onset. Rash initially appears on bilateral arms. Itchy. 6 days ago started usually Dove explofiated and a new bottle of Aveeno.  Had allergy testing and allergic to dust mites. No asthma, eczema, seasonal allergies. No SOB, wheezing.   Also complains of sore throat yesterday and sore throat improved, after Nyquil.  Feels like glands are swollen. No  Trouble swallowing or breathing. No sinus pressure, ear pain.     HISTORY:  Past Medical History:  Diagnosis Date  . Anemia   . 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 NO PAST SURGERIES     Family History  Problem Relation Age of Onset  . Cancer Maternal Grandmother     Allergies: Patient has no known allergies. Current Outpatient Prescriptions on File Prior to Visit  Medication Sig Dispense Refill  . citalopram (CELEXA) 10 MG tablet Take 1 tablet (10 mg total) by mouth daily. 30 tablet 0  . etonogestrel-ethinyl estradiol (NUVARING) 0.12-0.015 MG/24HR vaginal ring Insert vaginally and leave in place for 3 consecutive weeks, then remove for 1 week. 3 each 0   No current facility-administered medications on file prior to visit.     Social History  Substance Use Topics  . Smoking status: Never Smoker  . Smokeless tobacco: Never Used  . Alcohol use Yes     Comment: socially    Review of Systems  Constitutional: Negative for chills and fever.  HENT: Positive for sore throat (resolved). Negative for congestion and trouble swallowing.   Respiratory: Negative for cough, shortness of breath and wheezing.     Cardiovascular: Negative for chest pain and palpitations.  Gastrointestinal: Negative for nausea and vomiting.  Skin: Positive for rash.      Objective:    BP 124/86   Pulse 87   Temp 98.3 F (36.8 C) (Oral)   SpO2 98%    Physical Exam  Constitutional: She appears well-developed and well-nourished.  HENT:  Head: Normocephalic and atraumatic.  Right Ear: Hearing, tympanic membrane, external ear and ear canal normal. No drainage, swelling or tenderness. No foreign bodies. Tympanic membrane is not erythematous and not bulging. No middle ear effusion. No decreased hearing is noted.  Left Ear: Hearing, tympanic membrane, external ear and ear canal normal. No drainage, swelling or tenderness. No foreign bodies. Tympanic membrane is not erythematous and not bulging.  No middle ear effusion. No decreased hearing is noted.  Nose: Nose normal. No rhinorrhea. Right sinus exhibits no maxillary sinus tenderness and no frontal sinus tenderness. Left sinus exhibits no maxillary sinus tenderness and no frontal sinus tenderness.  Mouth/Throat: Uvula is midline, oropharynx is clear and moist and mucous membranes are normal. No oropharyngeal exudate, posterior oropharyngeal edema, posterior oropharyngeal erythema or tonsillar abscesses.  Eyes: Conjunctivae are normal.  Cardiovascular: Regular rhythm, normal heart sounds and normal pulses.   Pulmonary/Chest: Effort normal and breath sounds normal. She has no wheezes. She has no rhonchi. She has no rales.  Lymphadenopathy:       Head (right side): No submental, no submandibular, no  tonsillar, no preauricular, no posterior auricular and no occipital adenopathy present.       Head (left side): No submental, no submandibular, no tonsillar, no preauricular, no posterior auricular and no occipital adenopathy present.    She has no cervical adenopathy.  Neurological: She is alert.  Skin: Skin is warm and dry. Rash noted. Rash is maculopapular.   Discrete sand  paper maculopapular lesions noted bilateral upper extremities, trunk, and face. No lesions of her hands or feet. No lesions of oral mucosa.  Psychiatric: She has a normal mood and affect. Her speech is normal and behavior is normal. Thought content normal.  Vitals reviewed.      Assessment & Plan:   1. Rash and nonspecific skin eruption Etiology of rash nonspecific at this time. I'm reassured the patient has no acute respiratory distress. She is afebrile. Suspect contact dermatitis with new agents started last week. Alternatively considering viral exanthem. Trial of prednisone. Follow-up in 2 days.  - predniSONE (DELTASONE) 10 MG tablet; Take 4 tablets ( total 40 mg) by mouth for 2 days; take 3 tablets ( total 30 mg) by mouth for 2 days; take 2 tablets ( total 20 mg) by mouth for 1 day; take 1 tablet ( total 10 mg) by mouth for 1 day.  Dispense: 17 tablet; Refill: 0    I am having Ms. Echeverry start on predniSONE. I am also having her maintain her citalopram and etonogestrel-ethinyl estradiol.   Meds ordered this encounter  Medications  . predniSONE (DELTASONE) 10 MG tablet    Sig: Take 4 tablets ( total 40 mg) by mouth for 2 days; take 3 tablets ( total 30 mg) by mouth for 2 days; take 2 tablets ( total 20 mg) by mouth for 1 day; take 1 tablet ( total 10 mg) by mouth for 1 day.    Dispense:  17 tablet    Refill:  0    Order Specific Question:   Supervising Provider    Answer:   Sherlene ShamsULLO, TERESA L [[id number]]    Return precautions given.   Risks, benefits, and alternatives of the medications and treatment plan prescribed today were discussed, and patient expressed understanding.   Education regarding symptom management and diagnosis given to patient on AVS.  Continue to follow with Carollee Leitzarrie M Doss, NP for routine health maintenance.   Kim Reeves Bazzle and I agreed with plan.   Rennie PlowmanMargaret Arnett, FNP

## 2016-10-17 NOTE — Patient Instructions (Signed)
Trial of prednisone. Suspect etiology is allergic as from  new skin products or viral.  Follow-up in 2 days.  If there is no improvement in your symptoms, or if there is any worsening of symptoms, or if you have any additional concerns, please return for re-evaluation; or, if we are closed, consider going to the Emergency Room for evaluation if symptoms urgent.

## 2016-10-19 ENCOUNTER — Encounter: Payer: Self-pay | Admitting: Family

## 2016-10-19 ENCOUNTER — Ambulatory Visit (INDEPENDENT_AMBULATORY_CARE_PROVIDER_SITE_OTHER): Payer: 59 | Admitting: Family

## 2016-10-19 VITALS — BP 120/70 | HR 76 | Temp 98.4°F | Ht 67.0 in | Wt 159.1 lb

## 2016-10-19 DIAGNOSIS — F411 Generalized anxiety disorder: Secondary | ICD-10-CM | POA: Diagnosis not present

## 2016-10-19 DIAGNOSIS — R21 Rash and other nonspecific skin eruption: Secondary | ICD-10-CM | POA: Diagnosis not present

## 2016-10-19 MED ORDER — TRIAMCINOLONE ACETONIDE 0.025 % EX CREA: 1.0000 "application " | TOPICAL_CREAM | Freq: Two times a day (BID) | CUTANEOUS | 1 refills | Status: DC

## 2016-10-19 NOTE — Patient Instructions (Signed)
Topical steroid for your arms. Please let me know if not better. I will look forward to seeing you in January and let me know in the interim how you're doing on the Celexa.

## 2016-10-19 NOTE — Progress Notes (Signed)
Subjective:    Patient ID: Kim Reeves, female    DOB: [birth year], 25 y.o.   MRN: [id number]  CC: Kim Reeves is a 25 y.o. female who presents today for an acute visit.    HPI: Patient here for follow up on acute rash, improving. Seen 2 days ago and placed on prednisone taper and face, trunk improving. Remains on arms. Hasn't spread.  Taking benadryl. No fever, chills, nausea, vomiting.  Anxiety- prescribed Celexa in the past however did not take medication. Reports increasing anxiety of late especially in large crowds. No thoughts of hurting herself or anyone else. No depression. Has been seeing a counselor which has been helping a lot.    HISTORY:  Past Medical History:  Diagnosis Date  . Anemia   . 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 NO PAST SURGERIES     Family History  Problem Relation Age of Onset  . Cancer Maternal Grandmother     Allergies: Patient has no known allergies. Current Outpatient Prescriptions on File Prior to Visit  Medication Sig Dispense Refill  . citalopram (CELEXA) 10 MG tablet Take 1 tablet (10 mg total) by mouth daily. 30 tablet 0  . etonogestrel-ethinyl estradiol (NUVARING) 0.12-0.015 MG/24HR vaginal ring Insert vaginally and leave in place for 3 consecutive weeks, then remove for 1 week. 3 each 0  . predniSONE (DELTASONE) 10 MG tablet Take 4 tablets ( total 40 mg) by mouth for 2 days; take 3 tablets ( total 30 mg) by mouth for 2 days; take 2 tablets ( total 20 mg) by mouth for 1 day; take 1 tablet ( total 10 mg) by mouth for 1 day. 17 tablet 0   No current facility-administered medications on file prior to visit.     Social History  Substance Use Topics  . Smoking status: Never Smoker  . Smokeless tobacco: Never Used  . Alcohol use Yes     Comment: socially    Review of Systems  Constitutional: Negative for chills and fever.    Respiratory: Negative for cough.   Cardiovascular: Negative for chest pain and palpitations.  Gastrointestinal: Negative for nausea and vomiting.  Skin: Positive for rash.  Psychiatric/Behavioral: Negative for sleep disturbance and suicidal ideas. The patient is nervous/anxious.       Objective:    BP 120/70   Pulse 76   Temp 98.4 F (36.9 C) (Oral)   Ht 5\' 7"  (1.702 m)   Wt 159 lb 1.6 oz (72.2 kg)   SpO2 98%   BMI 24.92 kg/m    Physical Exam  Constitutional: She appears well-developed and well-nourished.  Eyes: Conjunctivae are normal.  Cardiovascular: Normal rate, regular rhythm, normal heart sounds and normal pulses.   Pulmonary/Chest: Effort normal and breath sounds normal. She has no wheezes. She has no rhonchi. She has no rales.  Neurological: She is alert.  Skin: Skin is warm and dry. Rash noted. Rash is maculopapular.  Fine sandpaperlike maculopapular lesions noted on cheeks of face and bilateral upper arms. No pustules, streaking.  Psychiatric: She has a normal mood and affect. Her speech is normal and behavior is normal. Thought content normal.  Vitals reviewed.      Assessment & Plan:   Problem List Items Addressed This Visit      Musculoskeletal and Integument   Rash and nonspecific skin eruption - Primary    Etiology of the rash remains nonspecific  at this time. I'm very reassured that rash is improving over the past 2 days and anticipate it will continue to improve whether this is from the prednisone or just the nature of the illness. Working diagnosis include viral exanthem, alternatively considering anxiety. Return precautions given.      Relevant Medications   triamcinolone (KENALOG) 0.025 % cream     Other   GAD (generalized anxiety disorder)    Worsening. Improved with counseling however would like to trial Celexa since she did not take in past when prescribed. F/u 6-8 weeks.            I am having Ms. Hogan start on triamcinolone. I am also  having her maintain her citalopram, etonogestrel-ethinyl estradiol, and predniSONE.   Meds ordered this encounter  Medications  . triamcinolone (KENALOG) 0.025 % cream    Sig: Apply 1 application topically 2 (two) times daily. Arms only    Dispense:  15 g    Refill:  1    Order Specific Question:   Supervising Provider    Answer:   Sherlene ShamsULLO, TERESA L [[id number]]    Return precautions given.   Risks, benefits, and alternatives of the medications and treatment plan prescribed today were discussed, and patient expressed understanding.   Education regarding symptom management and diagnosis given to patient on AVS.  Continue to follow with Kim PlowmanMargaret Anab Vivar, FNP for routine health maintenance.   Kim Reeves and I agreed with plan.   Kim PlowmanMargaret Charleen Madera, FNP

## 2016-10-19 NOTE — Assessment & Plan Note (Addendum)
Worsening. Improved with counseling however would like to trial Celexa since she did not take in past when prescribed. F/u 6-8 weeks.

## 2016-10-19 NOTE — Progress Notes (Signed)
Pre visit review using our clinic review tool, if applicable. No additional management support is needed unless otherwise documented below in the visit note. 

## 2016-10-19 NOTE — Assessment & Plan Note (Signed)
Etiology of the rash remains nonspecific at this time. I'm very reassured that rash is improving over the past 2 days and anticipate it will continue to improve whether this is from the prednisone or just the nature of the illness. Working diagnosis include viral exanthem, alternatively considering anxiety. Return precautions given.

## 2016-12-02 ENCOUNTER — Emergency Department (HOSPITAL_COMMUNITY): Payer: 59

## 2016-12-02 ENCOUNTER — Encounter (HOSPITAL_COMMUNITY): Payer: Self-pay | Admitting: Emergency Medicine

## 2016-12-02 ENCOUNTER — Emergency Department (HOSPITAL_COMMUNITY)
Admission: EM | Admit: 2016-12-02 | Discharge: 2016-12-02 | Disposition: A | Discharge status: Home / Self Care | Payer: 59 | Attending: Emergency Medicine | Admitting: Emergency Medicine

## 2016-12-02 DIAGNOSIS — M546 Pain in thoracic spine: Secondary | ICD-10-CM

## 2016-12-02 DIAGNOSIS — S161XXA Strain of muscle, fascia and tendon at neck level, initial encounter: Secondary | ICD-10-CM | POA: Diagnosis not present

## 2016-12-02 DIAGNOSIS — Y939 Activity, unspecified: Secondary | ICD-10-CM | POA: Diagnosis not present

## 2016-12-02 DIAGNOSIS — Y999 Unspecified external cause status: Secondary | ICD-10-CM | POA: Diagnosis not present

## 2016-12-02 DIAGNOSIS — S199XXA Unspecified injury of neck, initial encounter: Secondary | ICD-10-CM | POA: Diagnosis present

## 2016-12-02 DIAGNOSIS — Y92410 Unspecified street and highway as the place of occurrence of the external cause: Secondary | ICD-10-CM | POA: Diagnosis not present

## 2016-12-02 LAB — POC URINE PREG, ED: Preg Test, Ur: NEGATIVE

## 2016-12-02 MED ORDER — ACETAMINOPHEN 500 MG PO TABS
1000.0000 mg | ORAL_TABLET | Freq: Once | ORAL | Status: AC
  Administered 2016-12-02: 1000 mg via ORAL
  Filled 2016-12-02: qty 2

## 2016-12-02 MED ORDER — IBUPROFEN 800 MG PO TABS
800.0000 mg | ORAL_TABLET | Freq: Once | ORAL | Status: AC
  Administered 2016-12-02: 800 mg via ORAL
  Filled 2016-12-02: qty 1

## 2016-12-02 MED ORDER — CYCLOBENZAPRINE HCL 10 MG PO TABS: 10.0000 mg | ORAL_TABLET | Freq: Two times a day (BID) | ORAL | 0 refills | Status: DC | PRN

## 2016-12-02 MED ORDER — CYCLOBENZAPRINE HCL 10 MG PO TABS
10.0000 mg | ORAL_TABLET | Freq: Once | ORAL | Status: DC
  Filled 2016-12-02: qty 1

## 2016-12-02 MED ORDER — IBUPROFEN 800 MG PO TABS: 800.0000 mg | ORAL_TABLET | Freq: Three times a day (TID) | ORAL | 0 refills | Status: DC
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
## 2016-12-02 NOTE — ED Notes (Signed)
Declined W/C at D/C and was escorted to lobby by RN. 
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
## 2016-12-11 ENCOUNTER — Ambulatory Visit (INDEPENDENT_AMBULATORY_CARE_PROVIDER_SITE_OTHER): Payer: 59 | Admitting: Family

## 2016-12-11 ENCOUNTER — Encounter: Payer: Self-pay | Admitting: Family

## 2016-12-11 DIAGNOSIS — F411 Generalized anxiety disorder: Secondary | ICD-10-CM | POA: Diagnosis not present

## 2016-12-11 DIAGNOSIS — R030 Elevated blood-pressure reading, without diagnosis of hypertension: Secondary | ICD-10-CM | POA: Diagnosis not present

## 2016-12-11 DIAGNOSIS — I10 Essential (primary) hypertension: Secondary | ICD-10-CM | POA: Insufficient documentation

## 2016-12-11 NOTE — Assessment & Plan Note (Signed)
Elevated today.  BP 2 weeks ago 120/70. Will continue to monitor at follow up in 6-8 weeks.
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
## 2016-12-11 NOTE — Patient Instructions (Signed)
I'm rooting for you.   Trial celexa  F/u 6-8 weeks

## 2016-12-11 NOTE — Assessment & Plan Note (Signed)
Stable, slight improvement. Patient agrees she will trial celexa this time. F/u 6-8 weeks.

## 2016-12-11 NOTE — Progress Notes (Signed)
Pre visit review using our clinic review tool, if applicable. No additional management support is needed unless otherwise documented below in the visit note. 

## 2017-01-08 ENCOUNTER — Ambulatory Visit (HOSPITAL_COMMUNITY)
Admission: EM | Admit: 2017-01-08 | Discharge: 2017-01-08 | Disposition: A | Discharge status: Home / Self Care | Payer: 59

## 2017-01-19 ENCOUNTER — Telehealth: Payer: Self-pay | Admitting: Family

## 2017-01-19 NOTE — Telephone Encounter (Signed)
Patient has an appointment at 2.30 at urgent care , stated by patient, advised to call office back if they do not start her on BP medication today.

## 2017-01-19 NOTE — Telephone Encounter (Signed)
Pt called back returning your call. Thank you! °

## 2017-01-19 NOTE — Telephone Encounter (Signed)
Left message to call.

## 2017-01-19 NOTE — Telephone Encounter (Signed)
Pt called about her BP bottom number is high. BP for this morning was 160/108 pulse 79. Pt was transferred to [facility]. Pt states it has been high all week. Please advise? Thank you!  Call pt @ [phone].

## 2017-01-19 NOTE — Telephone Encounter (Signed)
If urgent care doesn't start her on BP medication today,  She should start taking 2.5 mg amlodipine and follow up in one week with margaret.  Call her in #[phone]

## 2017-01-19 NOTE — Telephone Encounter (Signed)
Patient last 3 appointments BP has been trending up, patient having headache today so checked BP and reading was 160/108 pulse 79. Patient has temporal lobe bilateral headache has been taking Ibuprofen with no relief( advised patient not to take any more Ibuprofen until she was seen). 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 Patient never received a call back from [facility] after her transfer to them . She was advised on phone by [facility] she would receive a call back.

## 2017-01-22 ENCOUNTER — Encounter: Payer: Self-pay | Admitting: Family

## 2017-01-22 ENCOUNTER — Ambulatory Visit (INDEPENDENT_AMBULATORY_CARE_PROVIDER_SITE_OTHER): Payer: 59 | Admitting: Family

## 2017-01-22 VITALS — BP 125/90 | HR 78 | Temp 98.0°F | Ht 68.0 in | Wt 170.4 lb

## 2017-01-22 DIAGNOSIS — R030 Elevated blood-pressure reading, without diagnosis of hypertension: Secondary | ICD-10-CM | POA: Diagnosis not present

## 2017-01-22 MED ORDER — AMLODIPINE BESYLATE 2.5 MG PO TABS: 2.5000 mg | ORAL_TABLET | Freq: Every day | ORAL | 0 refills | Status: DC

## 2017-01-22 NOTE — Progress Notes (Signed)
Subjective:    Patient ID: Kim Reeves, female    DOB: 03/21/1991, 26 y.o.   MRN: [id number]  CC: Kim Reeves is a 26 y.o. female who presents today for follow up.   HPI: Elevated blood pressure over past several weeks, worsening.   Denies exertional chest pain or pressure, numbness or tingling radiating to left arm or jaw, palpitations, dizziness,  changes in vision, or shortness of breath.   Didn't go to Urgent care last week. Endorses 'slight' HA last week; didn't resolve with tyleonol. Excedrin migraine resolved. Mild frontal HA today. No vision changes. HA doesn't awaken from sleep.   Recent values at home in the mornings - 141/101, 152/108, 160/99, 123/93, 135/94  Has gained rapid weight over last year- size 6 to size 12.   Trying to work out and loose weight with trainer.   Starting diet with friend who is trainer- to have low salt, no processed foods.   No HTN in family.   On Nuvaring.       HISTORY:  Past Medical History:  Diagnosis Date  . Anemia   . 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 NO PAST SURGERIES     Family History  Problem Relation Age of Onset  . Cancer Maternal Grandmother     Allergies: Patient has no known allergies. Current Outpatient Prescriptions on File Prior to Visit  Medication Sig Dispense Refill  . etonogestrel-ethinyl estradiol (NUVARING) 0.12-0.015 MG/24HR vaginal ring Insert vaginally and leave in place for 3 consecutive weeks, then remove for 1 week. 3 each 0   No current facility-administered medications on file prior to visit.     Social History  Substance Use Topics  . Smoking status: Never Smoker  . Smokeless tobacco: Never Used  . Alcohol use Yes     Comment: socially    Review of Systems  Constitutional: Negative for chills and fever.  Eyes: Negative for visual disturbance.  Respiratory: Negative for  cough.   Cardiovascular: Negative for chest pain and palpitations.  Gastrointestinal: Negative for nausea and vomiting.  Neurological: Positive for headaches.      Objective:    BP 125/90   Pulse 78   Temp 98 F (36.7 C) (Oral)   Ht 5\' 8"  (1.727 m)   Wt 170 lb 6.4 oz (77.3 kg)   SpO2 97%   BMI 25.91 kg/m  BP Readings from Last 3 Encounters:  01/22/17 125/90  12/11/16 138/88  12/02/16 132/92   Wt Readings from Last 3 Encounters:  01/22/17 170 lb 6.4 oz (77.3 kg)  12/11/16 169 lb 9.6 oz (76.9 kg)  12/02/16 169 lb 14.4 oz (77.1 kg)    Physical Exam  Constitutional: She appears well-developed and well-nourished.  HENT:  Mouth/Throat: Uvula is midline, oropharynx is clear and moist and mucous membranes are normal.  Eyes: Conjunctivae and EOM are normal. Pupils are equal, round, and reactive to light.  Fundus normal bilaterally.   Cardiovascular: Normal rate, regular rhythm, normal heart sounds and normal pulses.   Pulmonary/Chest: Effort normal and breath sounds normal. She has no wheezes. She has no rhonchi. She has no rales.  Neurological: She is alert. She has normal strength. No cranial nerve deficit or sensory deficit. She displays a negative Romberg sign.  Reflex Scores:      Bicep reflexes are 2+ on the right side and 2+ on the left side.  Patellar reflexes are 2+ on the right side and 2+ on the left side. Grip equal and strong bilateral upper extremities. Gait strong and steady. Able to perform rapid alternating movement without difficulty.   Skin: Skin is warm and dry.  Psychiatric: She has a normal mood and affect. Her speech is normal and behavior is normal. Thought content normal.  Vitals reviewed.      Assessment & Plan:   Problem List Items Addressed This Visit      Other   Elevated blood pressure reading - Primary    Fluctuate bp at home. Dull HA. Due to having symptoms, we jointly agreed to start low dose CCB. Reassured by normal neurologic exam.  Suspect weight gain over past year, indiscretion in salt contributing. No significant family history. Pending labs. If BP continues to be high, we may have to discontinue Nuvaring. F/u 3 months.       Relevant Medications   amLODipine (NORVASC) 2.5 MG tablet   Other Relevant Orders   Comprehensive metabolic panel   CBC with Differential/Platelet   TSH       I am having Kim Reeves start on amLODipine. I am also having her maintain her etonogestrel-ethinyl estradiol.   Meds ordered this encounter  Medications  . amLODipine (NORVASC) 2.5 MG tablet    Sig: Take 1 tablet (2.5 mg total) by mouth daily.    Dispense:  90 tablet    Refill:  0    Order Specific Question:   Supervising Provider    Answer:   Kim Reeves [[id number]]    Return precautions given.   Risks, benefits, and alternatives of the medications and treatment plan prescribed today were discussed, and patient expressed understanding.   Education regarding symptom management and diagnosis given to patient on AVS.  Continue to follow with Kim PlowmanMargaret Arnett, FNP for routine health maintenance.   Kim Reeves and I agreed with plan.   Kim PlowmanMargaret Arnett, FNP

## 2017-01-22 NOTE — Patient Instructions (Signed)
Watch BP.  Goal < 140/90.   Let me know if headaches don't improve

## 2017-01-22 NOTE — Progress Notes (Signed)
Pre visit review using our clinic review tool, if applicable. No additional management support is needed unless otherwise documented below in the visit note. 

## 2017-01-22 NOTE — Addendum Note (Signed)
Addended by: Allegra GranaARNETT, Mccade Sullenberger G on: 01/22/2017 09:19 AM   Modules accepted: Orders

## 2017-01-22 NOTE — Assessment & Plan Note (Addendum)
Fluctuate bp at home. Dull HA. Due to having symptoms, we jointly agreed to start low dose CCB. Reassured by normal neurologic exam. Suspect weight gain over past year, indiscretion in salt contributing. No significant family history. Pending labs. If BP continues to be high, we may have to discontinue Nuvaring. F/u 3 months.

## 2017-01-24 ENCOUNTER — Telehealth: Payer: Self-pay | Admitting: Family

## 2017-01-24 NOTE — Progress Notes (Signed)
Left message for patient to return call back.  

## 2017-01-24 NOTE — Telephone Encounter (Signed)
Pt called back returning your call. Thank you!  Call pt@ [phone]

## 2017-01-25 NOTE — Progress Notes (Signed)
Patient was notified of pended labs, Possible BC change and for FU appointment in 3 months.  Patient understood all information and did not have any questions, comments, or concerns at this time.

## 2017-01-25 NOTE — Telephone Encounter (Signed)
Please see other note

## 2017-01-31 ENCOUNTER — Other Ambulatory Visit (INDEPENDENT_AMBULATORY_CARE_PROVIDER_SITE_OTHER): Payer: 59

## 2017-01-31 DIAGNOSIS — R030 Elevated blood-pressure reading, without diagnosis of hypertension: Secondary | ICD-10-CM | POA: Diagnosis not present

## 2017-01-31 NOTE — Addendum Note (Signed)
Addended by: Doniesha Landau S on: 01/31/2017 02:49 PM   Modules accepted: Orders  

## 2017-01-31 NOTE — Addendum Note (Signed)
Addended by: Warden FillersWRIGHT, LATOYA S on: 01/31/2017 02:49 PM   Modules accepted: Orders

## 2017-02-01 ENCOUNTER — Other Ambulatory Visit: Payer: Self-pay | Admitting: Family

## 2017-02-01 DIAGNOSIS — D649 Anemia, unspecified: Secondary | ICD-10-CM

## 2017-02-01 LAB — CBC WITH DIFFERENTIAL/PLATELET
Basophils Absolute: 0 10*3/uL (ref 0.0–0.2)
Basos: 0 %
EOS (ABSOLUTE): 0.1 10*3/uL (ref 0.0–0.4)
EOS: 1 %
HEMATOCRIT: 37.9 % (ref 34.0–46.6)
Hemoglobin: 12.2 g/dL (ref 11.1–15.9)
IMMATURE GRANULOCYTES: 0 %
Immature Grans (Abs): 0 10*3/uL (ref 0.0–0.1)
LYMPHS ABS: 3 10*3/uL (ref 0.7–3.1)
Lymphs: 33 %
MCH: 22.7 pg — ABNORMAL LOW (ref 26.6–33.0)
MCHC: 32.2 g/dL (ref 31.5–35.7)
MCV: 71 fL — ABNORMAL LOW (ref 79–97)
MONOS ABS: 0.8 10*3/uL (ref 0.1–0.9)
Monocytes: 9 %
NEUTROS PCT: 57 %
Neutrophils Absolute: 5.3 10*3/uL (ref 1.4–7.0)
PLATELETS: 373 10*3/uL (ref 150–379)
RBC: 5.37 x10E6/uL — ABNORMAL HIGH (ref 3.77–5.28)
RDW: 15.9 % — AB (ref 12.3–15.4)
WBC: 9.2 10*3/uL (ref 3.4–10.8)

## 2017-02-01 LAB — COMPREHENSIVE METABOLIC PANEL
A/G RATIO: 1.4 (ref 1.2–2.2)
ALBUMIN: 4.2 g/dL (ref 3.5–5.5)
ALK PHOS: 56 IU/L (ref 39–117)
ALT: 20 IU/L (ref 0–32)
AST: 15 IU/L (ref 0–40)
BILIRUBIN TOTAL: 0.3 mg/dL (ref 0.0–1.2)
BUN / CREAT RATIO: 12 (ref 9–23)
BUN: 10 mg/dL (ref 6–20)
CO2: 24 mmol/L (ref 18–29)
Calcium: 9.3 mg/dL (ref 8.7–10.2)
Chloride: 101 mmol/L (ref 96–106)
Creatinine, Ser: 0.84 mg/dL (ref 0.57–1.00)
GFR calc Af Amer: 110 mL/min/{1.73_m2} (ref >59)
GFR calc non Af Amer: 96 mL/min/{1.73_m2} (ref >59)
GLOBULIN, TOTAL: 2.9 g/dL (ref 1.5–4.5)
Glucose: 85 mg/dL (ref 65–99)
POTASSIUM: 4.3 mmol/L (ref 3.5–5.2)
SODIUM: 140 mmol/L (ref 134–144)
Total Protein: 7.1 g/dL (ref 6.0–8.5)

## 2017-02-01 LAB — TSH: TSH: 2.14 u[IU]/mL (ref 0.450–4.500)

## 2017-02-05 ENCOUNTER — Other Ambulatory Visit (INDEPENDENT_AMBULATORY_CARE_PROVIDER_SITE_OTHER): Payer: 59

## 2017-02-05 DIAGNOSIS — D649 Anemia, unspecified: Secondary | ICD-10-CM

## 2017-02-05 NOTE — Addendum Note (Signed)
Addended by: Warden FillersWRIGHT, Biviana Saddler S on: 02/05/2017 02:58 PM   Modules accepted: Orders

## 2017-02-06 ENCOUNTER — Other Ambulatory Visit: Payer: Self-pay | Admitting: Family

## 2017-02-06 LAB — IRON AND TIBC
IRON SATURATION: 25 % (ref 15–55)
Iron: 98 ug/dL (ref 27–159)
Total Iron Binding Capacity: 387 ug/dL (ref 250–450)
UIBC: 289 ug/dL (ref 131–425)

## 2017-02-06 LAB — B12 AND FOLATE PANEL: VITAMIN B 12: 415 pg/mL (ref 232–1245)

## 2017-02-08 ENCOUNTER — Ambulatory Visit: Payer: 59 | Admitting: Family

## 2017-05-15 ENCOUNTER — Encounter: Payer: Self-pay | Admitting: Obstetrics & Gynecology

## 2017-05-15 ENCOUNTER — Ambulatory Visit (INDEPENDENT_AMBULATORY_CARE_PROVIDER_SITE_OTHER): Payer: 59 | Admitting: Obstetrics & Gynecology

## 2017-05-15 VITALS — BP 120/80 | HR 81 | Ht 68.0 in | Wt 171.0 lb

## 2017-05-15 DIAGNOSIS — Z01419 Encounter for gynecological examination (general) (routine) without abnormal findings: Secondary | ICD-10-CM | POA: Diagnosis not present

## 2017-05-15 DIAGNOSIS — Z0000 Encounter for general adult medical examination without abnormal findings: Secondary | ICD-10-CM

## 2017-05-15 DIAGNOSIS — Z113 Encounter for screening for infections with a predominantly sexual mode of transmission: Secondary | ICD-10-CM

## 2017-05-15 DIAGNOSIS — Z124 Encounter for screening for malignant neoplasm of cervix: Secondary | ICD-10-CM

## 2017-05-15 DIAGNOSIS — Z3049 Encounter for surveillance of other contraceptives: Secondary | ICD-10-CM

## 2017-05-15 MED ORDER — ETONOGESTREL-ETHINYL ESTRADIOL 0.12-0.015 MG/24HR VA RING: VAGINAL_RING | VAGINAL | 4 refills | Status: DC

## 2017-05-15 NOTE — Patient Instructions (Signed)
Ethinyl Estradiol; Etonogestrel vaginal ring What is this medicine? ETHINYL ESTRADIOL; ETONOGESTREL (ETH in il es tra DYE ole; et oh noe JES trel) vaginal ring is a flexible, vaginal ring used as a contraceptive (birth control method). This medicine combines two types of female hormones, an estrogen and a progestin. This ring is used to prevent ovulation and pregnancy. Each ring is effective for one month. This medicine may be used for other purposes; ask your health care provider or pharmacist if you have questions. COMMON BRAND NAME(S): NuvaRing What should I tell my health care provider before I take this medicine? They need to know if you have or ever had any of these conditions: -abnormal vaginal bleeding -blood vessel disease or blood clots -breast, cervical, endometrial, ovarian, liver, or uterine cancer -diabetes -gallbladder disease -heart disease or recent heart attack -high blood pressure -high cholesterol -kidney disease -liver disease -migraine headaches -stroke -systemic lupus erythematosus (SLE) -tobacco smoker -an unusual or allergic reaction to estrogens, progestins, other medicines, foods, dyes, or preservatives -pregnant or trying to get pregnant -breast-feeding How should I use this medicine? Insert the ring into your vagina as directed. Follow the directions on the prescription label. The ring will remain place for 3 weeks and is then removed for a 1-week break. A new ring is inserted 1 week after the last ring was removed, on the same day of the week. Check often to make sure the ring is still in place, especially before and after sexual intercourse. If the ring was out of the vagina for an unknown amount of time, you may not be protected from pregnancy. Perform a pregnancy test and call your doctor. Do not use more often than directed. A patient package insert for the product will be given with each prescription and refill. Read this sheet carefully each time. The  sheet may change frequently. Contact your pediatrician regarding the use of this medicine in children. Special care may be needed. This medicine has been used in female children who have started having menstrual periods. Overdosage: If you think you have taken too much of this medicine contact a poison control center or emergency room at once. NOTE: This medicine is only for you. Do not share this medicine with others. What if I miss a dose? You will need to replace your vaginal ring once a month as directed. If the ring should slip out, or if you leave it in longer or shorter than you should, contact your health care professional for advice. What may interact with this medicine? Do not take this medicine with the following medication: -dasabuvir; ombitasvir; paritaprevir; ritonavir -ombitasvir; paritaprevir; ritonavir This medicine may also interact with the following medications: -acetaminophen -antibiotics or medicines for infections, especially rifampin, rifabutin, rifapentine, and griseofulvin, and possibly penicillins or tetracyclines -aprepitant -ascorbic acid (vitamin C) -atorvastatin -barbiturate medicines, such as phenobarbital -bosentan -carbamazepine -caffeine -clofibrate -cyclosporine -dantrolene -doxercalciferol -felbamate -grapefruit juice -hydrocortisone -medicines for anxiety or sleeping problems, such as diazepam or temazepam -medicines for diabetes, including pioglitazone -modafinil -mycophenolate -nefazodone -oxcarbazepine -phenytoin -prednisolone -ritonavir or other medicines for HIV infection or AIDS -rosuvastatin -selegiline -soy isoflavones supplements -St. John's wort -tamoxifen or raloxifene -theophylline -thyroid hormones -topiramate -warfarin This list may not describe all possible interactions. Give your health care provider a list of all the medicines, herbs, non-prescription drugs, or dietary supplements you use. Also tell them if you smoke,  drink alcohol, or use illegal drugs. Some items may interact with your medicine. What should I watch for while using   this medicine? Visit your doctor or health care professional for regular checks on your progress. You will need a regular breast and pelvic exam and Pap smear while on this medicine. Use an additional method of contraception during the first cycle that you use this ring. Do not use a diaphragm or female condom, as the ring can interfere with these birth control methods and their proper placement. If you have any reason to think you are pregnant, stop using this medicine right away and contact your doctor or health care professional. If you are using this medicine for hormone related problems, it may take several cycles of use to see improvement in your condition. Smoking increases the risk of getting a blood clot or having a stroke while you are using hormonal birth control, especially if you are more than 28 years old. You are strongly advised not to smoke. This medicine can make your body retain fluid, making your fingers, hands, or ankles swell. Your blood pressure can go up. Contact your doctor or health care professional if you feel you are retaining fluid. This medicine can make you more sensitive to the sun. Keep out of the sun. If you cannot avoid being in the sun, wear protective clothing and use sunscreen. Do not use sun lamps or tanning beds/booths. If you wear contact lenses and notice visual changes, or if the lenses begin to feel uncomfortable, consult your eye care specialist. In some women, tenderness, swelling, or minor bleeding of the gums may occur. Notify your dentist if this happens. Brushing and flossing your teeth regularly may help limit this. See your dentist regularly and inform your dentist of the medicines you are taking. If you are going to have elective surgery, you may need to stop using this medicine before the surgery. Consult your health care professional  for advice. This medicine does not protect you against HIV infection (AIDS) or any other sexually transmitted diseases. What side effects may I notice from receiving this medicine? Side effects that you should report to your doctor or health care professional as soon as possible: -breast tissue changes or discharge -changes in vaginal bleeding during your period or between your periods -chest pain -coughing up blood -dizziness or fainting spells -headaches or migraines -leg, arm or groin pain -severe or sudden headaches -stomach pain (severe) -sudden shortness of breath -sudden loss of coordination, especially on one side of the body -speech problems -symptoms of vaginal infection like itching, irritation or unusual discharge -tenderness in the upper abdomen -vomiting -weakness or numbness in the arms or legs, especially on one side of the body -yellowing of the eyes or skin Side effects that usually do not require medical attention (report to your doctor or health care professional if they continue or are bothersome): -breakthrough bleeding and spotting that continues beyond the 3 initial cycles of pills -breast tenderness -mood changes, anxiety, depression, frustration, anger, or emotional outbursts -increased sensitivity to sun or ultraviolet light -nausea -skin rash, acne, or brown spots on the skin -weight gain (slight) This list may not describe all possible side effects. Call your doctor for medical advice about side effects. You may report side effects to FDA at 1-800-FDA-1088. Where should I keep my medicine? Keep out of the reach of children. Store at room temperature between 15 and 30 degrees C (59 and 86 degrees F) for up to 4 months. The product will expire after 4 months. Protect from light. Throw away any unused medicine after the expiration date. NOTE: This   sheet is a summary. It may not cover all possible information. If you have questions about this medicine, talk  to your doctor, pharmacist, or health care provider.  2018 [employer]/Gold Standard (2016-07-28 17:00:31)  

## 2017-05-15 NOTE — Progress Notes (Signed)
HPI:      [name] is a 26 y.o. G1P0010 who LMP was Patient's last menstrual period was 05/04/2017., she presents today for her annual examination. The patient has no complaints today. The patient is sexually active. Her last pap: was normal. The patient does perform self breast exams.  There is notable family history of breast or ovarian cancer in her family.  The patient has regular exercise: yes.  The patient denies current symptoms of depression.  More sweating than usual; normal thyroid check 3 mos ago w PCP.  GYN History: Contraception: NuvaRing vaginal inserts  PMHx: Past Medical History:  Diagnosis Date  . Anemia   . Hypertension   . 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 NO PAST SURGERIES     Family History  Problem Relation Age of Onset  . Cancer Maternal Grandmother    Social History  Substance Use Topics  . Smoking status: Never Smoker  . Smokeless tobacco: Never Used  . Alcohol use Yes     Comment: socially    Current Outpatient Prescriptions:  .  amLODipine (NORVASC) 2.5 MG tablet, Take 1 tablet (2.5 mg total) by mouth daily., Disp: 90 tablet, Rfl: 0 .  etonogestrel-ethinyl estradiol (NUVARING) 0.12-0.015 MG/24HR vaginal ring, Insert vaginally and leave in place for 3 consecutive weeks, then remove for 1 week., Disp: 3 each, Rfl: 4 Allergies: Patient has no known allergies.  Review of Systems  Constitutional: Negative for chills, fever and malaise/fatigue.  HENT: Negative for congestion, sinus pain and sore throat.   Eyes: Negative for blurred vision and pain.  Respiratory: Negative for cough and wheezing.   Cardiovascular: Negative for chest pain and leg swelling.  Gastrointestinal: Negative for abdominal pain, constipation, diarrhea, heartburn, nausea and vomiting.  Genitourinary: Negative for dysuria, frequency, hematuria and urgency.    Musculoskeletal: Negative for back pain, joint pain, myalgias and neck pain.  Skin: Negative for itching and rash.  Neurological: Negative for dizziness, tremors and weakness.  Endo/Heme/Allergies: Does not bruise/bleed easily.  Psychiatric/Behavioral: Negative for depression. The patient is not nervous/anxious and does not have insomnia.     Objective: BP 120/80   Pulse 81   Ht 5\' 8"  (1.727 m)   Wt 171 lb (77.6 kg)   LMP 05/04/2017   BMI 26.00 kg/m   Filed Weights   05/15/17 1428  Weight: 171 lb (77.6 kg)   Body mass index is 26 kg/m. Physical Exam  Constitutional: She is oriented to person, place, and time. She appears well-developed and well-nourished. No distress.  Genitourinary: Rectum normal, vagina normal and uterus normal. Pelvic exam was performed with patient supine. There is no rash or lesion on the right labia. There is no rash or lesion on the left labia. Vagina exhibits no lesion. No bleeding in the vagina. Right adnexum does not display mass and does not display tenderness. Left adnexum does not display mass and does not display tenderness. Cervix does not exhibit motion tenderness, lesion, friability or polyp.   Uterus is mobile and midaxial. Uterus is not enlarged or exhibiting a mass.  HENT:  Head: Normocephalic and atraumatic. Head is without laceration.  Right Ear: Hearing normal.  Left Ear: Hearing normal.  Nose: No epistaxis.  No foreign bodies.  Mouth/Throat: Uvula is midline, oropharynx is clear and moist and mucous membranes are normal.  Eyes: Pupils are equal, round, and reactive to light.  Neck:  Normal range of motion. Neck supple. No thyromegaly present.  Cardiovascular: Normal rate and regular rhythm.  Exam reveals no gallop and no friction rub.   No murmur heard. Pulmonary/Chest: Effort normal and breath sounds normal. No respiratory distress. She has no wheezes. Right breast exhibits no mass, no skin change and no tenderness. Left breast exhibits no  mass, no skin change and no tenderness.  Abdominal: Soft. Bowel sounds are normal. She exhibits no distension. There is no tenderness. There is no rebound.  Musculoskeletal: Normal range of motion.  Neurological: She is alert and oriented to person, place, and time. No cranial nerve deficit.  Skin: Skin is warm and dry.  Psychiatric: She has a normal mood and affect. Judgment normal.  Vitals reviewed.   Assessment:  ANNUAL EXAM 1. Annual physical exam   2. Screen for STD (sexually transmitted disease)   3. Screening for cervical cancer   4. Encounter for surveillance of other contraceptive      Screening Plan:            1.  Cervical Screening-  Pap smear done today  2. Labs managed by PCP  3. Counseling for contraception: NuvaRing vaginal inserts  Other:  1. Annual physical exam  2. Screen for STD (sexually transmitted disease) - IGP,CtNgTv,rfx Aptima HPV ASCU - HEP, RPR, HIV Panel - Hepatitis C antibody - HSV(herpes smplx)abs-1+2(IgG+IgM)-bld  3. Screening for cervical cancer - IGP,CtNgTv,rfx Aptima HPV ASCU  4. Encounter for surveillance of other contraceptive - etonogestrel-ethinyl estradiol (NUVARING) 0.12-0.015 MG/24HR vaginal ring; Insert vaginally and leave in place for 3 consecutive weeks, then remove for 1 week.  Dispense: 3 each; Refill: 4    F/U  Return in about 1 year (around 05/15/2018) for Annual.  Annamarie Major, MD, [facility], [facility] 05/15/2017  3:02 PM

## 2017-05-17 ENCOUNTER — Telehealth: Payer: Self-pay

## 2017-05-17 LAB — IGP,CTNGTV,RFX APTIMA HPV ASCU
Chlamydia, Nuc. Acid Amp: NEGATIVE
GONOCOCCUS, NUC. ACID AMP: NEGATIVE
PAP Smear Comment: 0
Trich vag by NAA: NEGATIVE

## 2017-05-17 LAB — HSV(HERPES SMPLX)ABS-I+II(IGG+IGM)-BLD
HSV 1 Glycoprotein G Ab, IgG: <0.91 index (ref 0.00–0.90)
HSV 2 Glycoprotein G Ab, IgG: <0.91 index (ref 0.00–0.90)
HSVI/II Comb IgM: 1.44 Ratio — ABNORMAL HIGH (ref 0.00–0.90)

## 2017-05-17 LAB — HEP, RPR, HIV PANEL
HEP B S AG: NEGATIVE
HIV Screen 4th Generation wRfx: NONREACTIVE
RPR: NONREACTIVE

## 2017-05-17 LAB — HEPATITIS C ANTIBODY: Hep C Virus Ab: <0.1 s/co ratio (ref 0.0–0.9)

## 2017-05-17 NOTE — Telephone Encounter (Signed)
Pt saw results on MyChart and has questions/how to read results.  [phone]

## 2017-05-17 NOTE — Progress Notes (Signed)
Will call when PAPTIMA results also in

## 2017-05-18 ENCOUNTER — Other Ambulatory Visit: Payer: Self-pay | Admitting: Obstetrics & Gynecology

## 2017-05-18 DIAGNOSIS — R899 Unspecified abnormal finding in specimens from other organs, systems and tissues: Secondary | ICD-10-CM

## 2017-05-18 NOTE — Progress Notes (Signed)
D/w pt results.  Plan to repeat HSV testing in 2 mos to see if converts IgG.

## 2017-05-18 NOTE — Telephone Encounter (Signed)
Please advise lab results.

## 2017-05-18 NOTE — Telephone Encounter (Signed)
Pt calling about missed Called for labs results. Please call patient

## 2017-07-12 ENCOUNTER — Other Ambulatory Visit: Payer: 59

## 2017-07-12 ENCOUNTER — Other Ambulatory Visit: Payer: Self-pay | Admitting: Obstetrics & Gynecology

## 2017-07-12 DIAGNOSIS — Z202 Contact with and (suspected) exposure to infections with a predominantly sexual mode of transmission: Secondary | ICD-10-CM

## 2017-07-12 DIAGNOSIS — R899 Unspecified abnormal finding in specimens from other organs, systems and tissues: Secondary | ICD-10-CM

## 2017-07-14 LAB — HSV(HERPES SIMPLEX VRS) I + II AB-IGG

## 2017-07-14 LAB — HSV(HERPES SIMPLEX VRS) I + II AB-IGM: HSVI/II COMB AB IGM: 1.42 ratio — AB (ref 0.00–0.90)

## 2017-08-08 NOTE — Telephone Encounter (Signed)
LM

## 2017-10-08 ENCOUNTER — Ambulatory Visit: Payer: Self-pay | Admitting: *Deleted

## 2017-10-08 NOTE — Telephone Encounter (Signed)
dizziness yesterday morning 800 AM; pt took blood pressure at 630 PM with automatic cuff on bil arms reading was 137/97; rechecked by RN at work at 1000 AM today with reading of 163/100; Stopped taking medications a few months ago. Pt also c/o slight headache (? Due to high BP) and neck ache; States she is not sure if this is related to elevated BP or sleep on the couch  Reason for Disposition . Systolic BP  >= 180 OR Diastolic >= 110  Answer Assessment - Initial Assessment Questions 1. BLOOD PRESSURE: "What is the blood pressure?" "Did you take at least two measurements 5 minutes apart?"     163/100 2. ONSET: "When did you take your blood pressure?"     1000 10/08/17 at 1000 by RN at work 3. HOW: "How did you obtain the blood pressure?" (e.g., visiting nurse, automatic home BP monitor)   Automatic cuff at work 4. HISTORY: "Do you have a history of high blood pressure?"     February 2018 5. MEDICATIONS: "Are you taking any medications for blood pressure?" "Have you missed any doses recently?"     Last taken a few months ago; pt stopped on her own  6. OTHER SYMPTOMS: "Do you have any symptoms?" (e.g., headache, chest pain, blurred vision, difficulty breathing, weakness)     Headache rated 5 out of  10 7. PREGNANCY: "Is there any chance you are pregnant?" "When was your last menstrual period?"    Nuvo ring LMP 09/12/17  Protocols used: HIGH BLOOD PRESSURE-A-AH

## 2017-10-09 ENCOUNTER — Ambulatory Visit: Payer: 59 | Admitting: Internal Medicine

## 2017-10-09 ENCOUNTER — Encounter: Payer: Self-pay | Admitting: Internal Medicine

## 2017-10-09 DIAGNOSIS — R030 Elevated blood-pressure reading, without diagnosis of hypertension: Secondary | ICD-10-CM | POA: Diagnosis not present

## 2017-10-09 MED ORDER — AMLODIPINE BESYLATE 2.5 MG PO TABS: 2.5000 mg | ORAL_TABLET | Freq: Every day | ORAL | 3 refills | Status: DC
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
## 2017-10-09 NOTE — Patient Instructions (Signed)
Appt w/Margaret in 4-6 weeks

## 2017-10-09 NOTE — Assessment & Plan Note (Addendum)
The pt has elevated BP and HA/dizziness x3 d. Not taking Norvasc. She is on NAS diet, exercising. BP was 163/100.  Restart Norvasc RTC 1 mo to see Claris CheMargaret Consider renal artery US if not better/labs

## 2017-10-22 ENCOUNTER — Encounter (HOSPITAL_COMMUNITY): Payer: Self-pay

## 2017-10-24 ENCOUNTER — Telehealth: Payer: Self-pay | Admitting: Family

## 2017-10-24 DIAGNOSIS — R030 Elevated blood-pressure reading, without diagnosis of hypertension: Secondary | ICD-10-CM

## 2017-10-24 DIAGNOSIS — Z3049 Encounter for surveillance of other contraceptives: Secondary | ICD-10-CM

## 2017-10-24 MED ORDER — AMLODIPINE BESYLATE 2.5 MG PO TABS: 2.5000 mg | ORAL_TABLET | Freq: Every day | ORAL | 1 refills | Status: DC

## 2017-10-24 MED ORDER — ETONOGESTREL-ETHINYL ESTRADIOL 0.12-0.015 MG/24HR VA RING: VAGINAL_RING | VAGINAL | 4 refills | Status: DC

## 2017-10-24 MED ORDER — AMLODIPINE BESYLATE 2.5 MG PO TABS: 2.5000 mg | ORAL_TABLET | Freq: Every day | ORAL | 0 refills | Status: DC

## 2017-10-24 NOTE — Telephone Encounter (Signed)
Please advise 

## 2017-10-24 NOTE — Telephone Encounter (Signed)
Medication has been faxed

## 2017-10-24 NOTE — Telephone Encounter (Signed)
Pt's insurance [facility]. Pt requesting amlodipine and Nuvaring to both be sent. Pt to call local [facility] to buy amlodipine out of pocket until meds are mailed in.

## 2017-10-24 NOTE — Telephone Encounter (Signed)
Copied from CRM [id number]. Topic: Quick Communication - See Telephone Encounter >> Oct 24, 2017  9:20 AM Landry MellowFoltz, Melissa J wrote: CRM for notification. See Telephone encounter for:   10/24/17. Pt needs to have  amLODipine (NORVASC) 2.5 MG tablet [facility] in order for it to be covered under insurance.   Cb number is [phone] Pt is out of medication

## 2018-01-31 ENCOUNTER — Other Ambulatory Visit: Payer: Self-pay

## 2018-01-31 ENCOUNTER — Encounter: Payer: Self-pay | Admitting: Internal Medicine

## 2018-01-31 ENCOUNTER — Other Ambulatory Visit: Payer: Self-pay | Admitting: Internal Medicine

## 2018-01-31 ENCOUNTER — Other Ambulatory Visit: Payer: Self-pay | Admitting: Radiology

## 2018-01-31 ENCOUNTER — Ambulatory Visit (INDEPENDENT_AMBULATORY_CARE_PROVIDER_SITE_OTHER): Payer: Managed Care, Other (non HMO)

## 2018-01-31 ENCOUNTER — Ambulatory Visit: Payer: Managed Care, Other (non HMO) | Admitting: Internal Medicine

## 2018-01-31 VITALS — BP 120/82 | HR 68 | Temp 98.9°F | Ht 68.0 in | Wt 176.8 lb

## 2018-01-31 DIAGNOSIS — M25561 Pain in right knee: Secondary | ICD-10-CM

## 2018-01-31 NOTE — Patient Instructions (Addendum)
F/u in 2-4 weeks sooner if needed  Try Advil 1-2 pills daily x 1 week   Knee Pain, Adult Knee pain in adults is common. It can be caused by many things, including:  Arthritis.  A fluid-filled sac (cyst) or growth in your knee.  An infection in your knee.  An injury that will not heal.  Damage, swelling, or irritation of the tissues that support your knee.  Knee pain is usually not a sign of a serious problem. The pain may go away on its own with time and rest. If it does not, a health care provider may order tests to find the cause of the pain. These may include:  Imaging tests, such as an X-ray, MRI, or ultrasound.  Joint aspiration. In this test, fluid is removed from the knee.  Arthroscopy. In this test, a lighted tube is inserted into knee and an image is projected onto a TV screen.  A biopsy. In this test, a sample of tissue is removed from the body and studied under a microscope.  Follow these instructions at home: Pay attention to any changes in your symptoms. Take these actions to relieve your pain. Activity  Rest your knee.  Do not do things that cause pain or make pain worse.  Avoid high-impact activities or exercises, such as running, jumping rope, or doing jumping jacks. General instructions  Take over-the-counter and prescription medicines only as told by your health care provider.  Raise (elevate) your knee above the level of your heart when you are sitting or lying down.  Sleep with a pillow under your knee.  If directed, apply ice to the knee: ? Put ice in a plastic bag. ? Place a towel between your skin and the bag. ? Leave the ice on for 20 minutes, 2-3 times a day.  Ask your health care provider if you should wear an elastic knee support.  Lose weight if you are overweight. Extra weight can put pressure on your knee.  Do not use any products that contain nicotine or tobacco, such as cigarettes and e-cigarettes. Smoking may slow the healing of any  bone and joint problems that you may have. If you need help quitting, ask your health care provider. Contact a health care provider if:  Your knee pain continues, changes, or gets worse.  You have a fever along with knee pain.  Your knee buckles or locks up.  Your knee swells, and the swelling becomes worse. Get help right away if:  Your knee feels warm to the touch.  You cannot move your knee.  You have severe pain in your knee.  You have chest pain.  You have trouble breathing. Summary  Knee pain in adults is common. It can be caused by many things, including, arthritis, infection, cysts, or injury.  Knee pain is usually not a sign of a serious problem, but if it does not go away, a health care provider may perform tests to know the cause of the pain.  Pay attention to any changes in your symptoms. Relieve your pain with rest, medicines, light activity, and use of ice.  Get help if your pain continues or becomes very severe, or if your knee buckles or locks up, or if you have chest pain or trouble breathing. This information is not intended to replace advice given to you by your health care provider. Make sure you discuss any questions you have with your health care provider. Document Released: 09/17/2007 Document Revised: 11/10/2016 Document Reviewed:  11/10/2016 Elsevier Interactive Patient Education  [employer]2018 Elsevier Inc.
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
## 2018-02-12 ENCOUNTER — Other Ambulatory Visit: Payer: Self-pay | Admitting: Internal Medicine

## 2018-02-12 DIAGNOSIS — M25561 Pain in right knee: Principal | ICD-10-CM

## 2018-02-12 DIAGNOSIS — G8929 Other chronic pain: Secondary | ICD-10-CM

## 2018-03-01 ENCOUNTER — Ambulatory Visit: Payer: Managed Care, Other (non HMO) | Admitting: Family

## 2018-05-15 ENCOUNTER — Encounter: Payer: Self-pay | Admitting: Family

## 2018-05-15 ENCOUNTER — Ambulatory Visit (INDEPENDENT_AMBULATORY_CARE_PROVIDER_SITE_OTHER): Payer: Managed Care, Other (non HMO) | Admitting: Family

## 2018-05-15 VITALS — BP 124/86 | HR 74 | Temp 98.3°F | Resp 16 | Ht 67.0 in | Wt 175.0 lb

## 2018-05-15 DIAGNOSIS — Z0000 Encounter for general adult medical examination without abnormal findings: Secondary | ICD-10-CM | POA: Diagnosis not present

## 2018-05-15 DIAGNOSIS — R202 Paresthesia of skin: Secondary | ICD-10-CM

## 2018-05-15 DIAGNOSIS — I10 Essential (primary) hypertension: Secondary | ICD-10-CM

## 2018-05-15 DIAGNOSIS — F411 Generalized anxiety disorder: Secondary | ICD-10-CM

## 2018-05-15 DIAGNOSIS — R5383 Other fatigue: Secondary | ICD-10-CM | POA: Diagnosis not present

## 2018-05-15 DIAGNOSIS — R200 Anesthesia of skin: Secondary | ICD-10-CM

## 2018-05-15 NOTE — Patient Instructions (Addendum)
Let me know if numbness in left leg persists, worsens in any way as we discussed  For sleep- try black out curtains, sound machine Let me know if sleep doesn't improve  Essentials for good sleep:   #1 Exercise #2 Limit Caffeine ( no caffeine after lunch) #3 No smart phones, TV prior to bed -- BLUE light is VERY activating and send the brain an 'awake message.'  #4 Go to bed at same time of night each night and get up at same time of day.  #5 Take 0.5 to 60m melatonin at 7pm with dinner -this is when natural melatonin will start to increase #6 May try over the counter [employer] for sleep aid Health Maintenance, Female Adopting a healthy lifestyle and getting preventive care can go a long way to promote health and wellness. Talk with your health care provider about what schedule of regular examinations is right for you. This is a good chance for you to check in with your provider about disease prevention and staying healthy. In between checkups, there are plenty of things you can do on your own. Experts have done a lot of research about which lifestyle changes and preventive measures are most likely to keep you healthy. Ask your health care provider for more information. Weight and diet Eat a healthy diet  Be sure to include plenty of vegetables, fruits, low-fat dairy products, and lean protein.  Do not eat a lot of foods high in solid fats, added sugars, or salt.  Get regular exercise. This is one of the most important things you can do for your health. ? Most adults should exercise for at least 150 minutes each week. The exercise should increase your heart rate and make you sweat (moderate-intensity exercise). ? Most adults should also do strengthening exercises at least twice a week. This is in addition to the moderate-intensity exercise.  Maintain a healthy weight  Body mass index (BMI) is a measurement that can be used to identify possible weight problems. It estimates body fat based on  height and weight. Your health care provider can help determine your BMI and help you achieve or maintain a healthy weight.  For females 219years of age and older: ? A BMI below 18.5 is considered underweight. ? A BMI of 18.5 to 24.9 is normal. ? A BMI of 25 to 29.9 is considered overweight. ? A BMI of 30 and above is considered obese.  Watch levels of cholesterol and blood lipids  You should start having your blood tested for lipids and cholesterol at 29years of age, then have this test every 5 years.  You may need to have your cholesterol levels checked more often if: ? Your lipid or cholesterol levels are high. ? You are older than 29years of age. ? You are at high risk for heart disease.  Cancer screening Lung Cancer  Lung cancer screening is recommended for adults [id number] old who are at high risk for lung cancer because of a history of smoking.  A yearly low-dose CT scan of the lungs is recommended for people who: ? Currently smoke. ? Have quit within the past 15 years. ? Have at least a 30-pack-year history of smoking. A pack year is smoking an average of one pack of cigarettes a day for 1 year.  Yearly screening should continue until it has been 15 years since you quit.  Yearly screening should stop if you develop a health problem that would prevent you from having  lung cancer treatment.  Breast Cancer  Practice breast self-awareness. This means understanding how your breasts normally appear and feel.  It also means doing regular breast self-exams. Let your health care provider know about any changes, no matter how small.  If you are in your 20s or 30s, you should have a clinical breast exam (CBE) by a health care provider every 1-3 years as part of a regular health exam.  If you are 9 or older, have a CBE every year. Also consider having a breast X-ray (mammogram) every year.  If you have a family history of breast cancer, talk to your health care provider  about genetic screening.  If you are at high risk for breast cancer, talk to your health care provider about having an MRI and a mammogram every year.  Breast cancer gene (BRCA) assessment is recommended for women who have family members with BRCA-related cancers. BRCA-related cancers include: ? Breast. ? Ovarian. ? Tubal. ? Peritoneal cancers.  Results of the assessment will determine the need for genetic counseling and BRCA1 and BRCA2 testing.  Cervical Cancer Your health care provider may recommend that you be screened regularly for cancer of the pelvic organs (ovaries, uterus, and vagina). This screening involves a pelvic examination, including checking for microscopic changes to the surface of your cervix (Pap test). You may be encouraged to have this screening done every 3 years, beginning at age 29.  For women ages 30-65, health care providers may recommend pelvic exams and Pap testing every 3 years, or they may recommend the Pap and pelvic exam, combined with testing for human papilloma virus (HPV), every 5 years. Some types of HPV increase your risk of cervical cancer. Testing for HPV may also be done on women of any age with unclear Pap test results.  Other health care providers may not recommend any screening for nonpregnant women who are considered low risk for pelvic cancer and who do not have symptoms. Ask your health care provider if a screening pelvic exam is right for you.  If you have had past treatment for cervical cancer or a condition that could lead to cancer, you need Pap tests and screening for cancer for at least 20 years after your treatment. If Pap tests have been discontinued, your risk factors (such as having a new sexual partner) need to be reassessed to determine if screening should resume. Some women have medical problems that increase the chance of getting cervical cancer. In these cases, your health care provider may recommend more frequent screening and Pap  tests.  Colorectal Cancer  This type of cancer can be detected and often prevented.  Routine colorectal cancer screening usually begins at 29 years of age and continues through 29 years of age.  Your health care provider may recommend screening at an earlier age if you have risk factors for colon cancer.  Your health care provider may also recommend using home test kits to check for hidden blood in the stool.  A small camera at the end of a tube can be used to examine your colon directly (sigmoidoscopy or colonoscopy). This is done to check for the earliest forms of colorectal cancer.  Routine screening usually begins at age 47.  Direct examination of the colon should be repeated every 5-10 years through 29 years of age. However, you may need to be screened more often if early forms of precancerous polyps or small growths are found.  Skin Cancer  Check your skin from head to  toe regularly.  Tell your health care provider about any new moles or changes in moles, especially if there is a change in a mole's shape or color.  Also tell your health care provider if you have a mole that is larger than the size of a pencil eraser.  Always use sunscreen. Apply sunscreen liberally and repeatedly throughout the day.  Protect yourself by wearing long sleeves, pants, a wide-brimmed hat, and sunglasses whenever you are outside.  Heart disease, diabetes, and high blood pressure  High blood pressure causes heart disease and increases the risk of stroke. High blood pressure is more likely to develop in: ? People who have blood pressure in the high end of the normal range (130-139/85-89 mm Hg). ? People who are overweight or obese. ? People who are African American.  If you are 29-72 years of age, have your blood pressure checked every 3-5 years. If you are 42 years of age or older, have your blood pressure checked every year. You should have your blood pressure measured twice-once when you are at  a hospital or clinic, and once when you are not at a hospital or clinic. Record the average of the two measurements. To check your blood pressure when you are not at a hospital or clinic, you can use: ? An automated blood pressure machine at a pharmacy. ? A home blood pressure monitor.  If you are between 69 years and 72 years old, ask your health care provider if you should take aspirin to prevent strokes.  Have regular diabetes screenings. This involves taking a blood sample to check your fasting blood sugar level. ? If you are at a normal weight and have a low risk for diabetes, have this test once every three years after 29 years of age. ? If you are overweight and have a high risk for diabetes, consider being tested at a younger age or more often. Preventing infection Hepatitis B  If you have a higher risk for hepatitis B, you should be screened for this virus. You are considered at high risk for hepatitis B if: ? You were born in a country where hepatitis B is common. Ask your health care provider which countries are considered high risk. ? Your parents were born in a high-risk country, and you have not been immunized against hepatitis B (hepatitis B vaccine). ? You have HIV or AIDS. ? You use needles to inject street drugs. ? You live with someone who has hepatitis B. ? You have had sex with someone who has hepatitis B. ? You get hemodialysis treatment. ? You take certain medicines for conditions, including cancer, organ transplantation, and autoimmune conditions.  Hepatitis C  Blood testing is recommended for: ? Everyone born from [birth year] through 1967. ? Anyone with known risk factors for hepatitis C.  Sexually transmitted infections (STIs)  You should be screened for sexually transmitted infections (STIs) including gonorrhea and chlamydia if: ? You are sexually active and are younger than 29 years of age. ? You are older than 29 years of age and your health care provider tells  you that you are at risk for this type of infection. ? Your sexual activity has changed since you were last screened and you are at an increased risk for chlamydia or gonorrhea. Ask your health care provider if you are at risk.  If you do not have HIV, but are at risk, it may be recommended that you take a prescription medicine daily to prevent HIV infection.  This is called pre-exposure prophylaxis (PrEP). You are considered at risk if: ? You are sexually active and do not regularly use condoms or know the HIV status of your partner(s). ? You take drugs by injection. ? You are sexually active with a partner who has HIV.  Talk with your health care provider about whether you are at high risk of being infected with HIV. If you choose to begin PrEP, you should first be tested for HIV. You should then be tested every 3 months for as long as you are taking PrEP. Pregnancy  If you are premenopausal and you may become pregnant, ask your health care provider about preconception counseling.  If you may become pregnant, take 400 to 800 micrograms (mcg) of folic acid every day.  If you want to prevent pregnancy, talk to your health care provider about birth control (contraception). Osteoporosis and menopause  Osteoporosis is a disease in which the bones lose minerals and strength with aging. This can result in serious bone fractures. Your risk for osteoporosis can be identified using a bone density scan.  If you are 10 years of age or older, or if you are at risk for osteoporosis and fractures, ask your health care provider if you should be screened.  Ask your health care provider whether you should take a calcium or vitamin D supplement to lower your risk for osteoporosis.  Menopause may have certain physical symptoms and risks.  Hormone replacement therapy may reduce some of these symptoms and risks. Talk to your health care provider about whether hormone replacement therapy is right for  you. Follow these instructions at home:  Schedule regular health, dental, and eye exams.  Stay current with your immunizations.  Do not use any tobacco products including cigarettes, chewing tobacco, or electronic cigarettes.  If you are pregnant, do not drink alcohol.  If you are breastfeeding, limit how much and how often you drink alcohol.  Limit alcohol intake to no more than 1 drink per day for nonpregnant women. One drink equals 12 ounces of beer, 5 ounces of wine, or 1 ounces of hard liquor.  Do not use street drugs.  Do not share needles.  Ask your health care provider for help if you need support or information about quitting drugs.  Tell your health care provider if you often feel depressed.  Tell your health care provider if you have ever been abused or do not feel safe at home. This information is not intended to replace advice given to you by your health care provider. Make sure you discuss any questions you have with your health care provider. Document Released: 06/05/2011 Document Revised: 04/27/2016 Document Reviewed: 08/24/2015 Elsevier Interactive Patient Education  [employer].

## 2018-05-15 NOTE — Assessment & Plan Note (Signed)
Suspect multifactorial.  Pending labs.  Discussed with patient sleep hygiene, turning off the TV, cell phone prior to bedtime.  She also tends to exercise after work, advised that this might be stimulating for her.  Declines sleep study.  Patient will let me know if no improvement

## 2018-05-15 NOTE — Assessment & Plan Note (Signed)
Intermittent.  Symptoms not present today.  Benign exam.  Pending labs.  Patient will let me know if worsens, persists.

## 2018-05-15 NOTE — Assessment & Plan Note (Signed)
Pap up-to-date.  Patient declines pelvic exam in the absence of complaints.  Clinical breast exam performed.

## 2018-05-15 NOTE — Assessment & Plan Note (Signed)
Controlled. Continue current regimen. 

## 2018-05-15 NOTE — Assessment & Plan Note (Signed)
Improved. Will follow 

## 2018-05-15 NOTE — Progress Notes (Signed)
Subjective:    Patient ID: Kim Reeves, female    DOB: Jun 24, 1991, 27 y.o.   MRN: [id number]  CC: Kim Reeves is a 27 y.o. female who presents today for physical exam.    HPI:  Complains of left soles of foot numbness, and radiates to front of lower thigh. Doesn't feel like foot is asleep. 3 weeks, unchanged. Couple of times a week, will go away on it's own after few minutes.  No vision changes. No sores on legs.No leg swelling.   HTN- on amlodipine. 120/80s. No cp, sob.   GAD- 'much better'. Using [employer] App which has helped significantly. No longer going to counselor. No si/hi.  Some trouble falling asleep in new apartment, very bright, noisy. Feels fatigue for 'forever'. Bedtime same time every night. Watching tv in bed. Snores. No adhd. No hi/si  Right patellofemoral syndrome- following with orthopedics   Colorectal Cancer Screening: no early family history Breast Cancer Screening: no first degree relatives with breast cancer. Declines genetic counseling Cervical Cancer Screening: UTD, 05/2017 Dr Tiburcio Pea Bone Health screening/DEXA for 65+: No increased fracture risk. Defer screening at this time. Lung Cancer Screening: Doesn't have 30 year pack year history and age > 55 years. Immunizations       Tetanus - utd        Labs: Screening labs today. Exercise: Gets regular exercise.  Alcohol use: Very rarely.  Smoking/tobacco use: Nonsmoker.  Regular dental exams: UTD Wears seat belt: Yes. Skin: no h/o skin cancer  HISTORY:  Past Medical History:  Diagnosis Date  . Anemia   . GAD (generalized anxiety disorder)   . Hypertension   . 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 NO PAST SURGERIES     Family History  Problem Relation Age of Onset  . Colon cancer Maternal Grandmother 60  . Breast cancer Maternal Grandmother 50  . Breast cancer Maternal Aunt         ALLERGIES: Patient has no known allergies.  Current Outpatient Medications on File Prior to Visit  Medication Sig Dispense Refill  . amLODipine (NORVASC) 2.5 MG tablet Take 1 tablet (2.5 mg total) by mouth daily. 90 tablet 1  . etonogestrel-ethinyl estradiol (NUVARING) 0.12-0.015 MG/24HR vaginal ring Insert vaginally and leave in place for 3 consecutive weeks, then remove for 1 week. 3 each 4   No current facility-administered medications on file prior to visit.     Social History   Tobacco Use  . Smoking status: Never Smoker  . Smokeless tobacco: Never Used  Substance Use Topics  . Alcohol use: Yes    Comment: socially  . Drug use: No    Review of Systems  Constitutional: Negative for chills, fever and unexpected weight change.  HENT: Negative for congestion.   Eyes: Negative for visual disturbance.  Respiratory: Negative for cough.   Cardiovascular: Negative for chest pain, palpitations and leg swelling.  Gastrointestinal: Negative for nausea and vomiting.  Musculoskeletal: Negative for arthralgias and myalgias.  Skin: Negative for rash.  Neurological: Positive for numbness. Negative for weakness and headaches.  Hematological: Negative for adenopathy.  Psychiatric/Behavioral: Positive for sleep disturbance. Negative for confusion. The patient is not nervous/anxious (improved).       Objective:    BP 124/86 (BP Location: Left Arm, Patient Position: Sitting, Cuff Size: Normal)   Pulse 74   Temp 98.3 F (36.8 C) (Oral)   Resp  16   Ht 5\' 7"  (1.702 m)   Wt 175 lb (79.4 kg)   LMP 05/08/2018   SpO2 98%   BMI 27.41 kg/m   BP Readings from Last 3 Encounters:  05/15/18 124/86  01/31/18 120/82  10/09/17 140/86   Wt Readings from Last 3 Encounters:  05/15/18 175 lb (79.4 kg)  01/31/18 176 lb 12.8 oz (80.2 kg)  10/09/17 170 lb (77.1 kg)    Physical Exam  Constitutional: She appears well-developed and well-nourished.  Eyes: Conjunctivae are normal.  Neck:  No thyroid mass and no thyromegaly present.  Cardiovascular: Normal rate, regular rhythm, normal heart sounds and normal pulses.  No LE edema.   Pulmonary/Chest: Effort normal and breath sounds normal. She has no wheezes. She has no rhonchi. She has no rales. Right breast exhibits no inverted nipple, no mass, no nipple discharge, no skin change and no tenderness. Left breast exhibits no inverted nipple, no mass, no nipple discharge, no skin change and no tenderness. Breasts are symmetrical.  CBE performed.   Lymphadenopathy:       Head (right side): No submental, no submandibular, no tonsillar, no preauricular, no posterior auricular and no occipital adenopathy present.       Head (left side): No submental, no submandibular, no tonsillar, no preauricular, no posterior auricular and no occipital adenopathy present.    She has no cervical adenopathy.       Right cervical: No superficial cervical, no deep cervical and no posterior cervical adenopathy present.      Left cervical: No superficial cervical, no deep cervical and no posterior cervical adenopathy present.    She has no axillary adenopathy.  Neurological: She is alert.  Sensation intact bilateral lower extremities to microfilament. Strength 5/5 BLE  Skin: Skin is warm and dry.  Psychiatric: She has a normal mood and affect. Her speech is normal and behavior is normal. Thought content normal.  Vitals reviewed.      Assessment & Plan:   Problem List Items Addressed This Visit      Cardiovascular and Mediastinum   HTN (hypertension)    Controlled.  Continue current regimen        Other   Routine physical examination - Primary    Pap up-to-date.  Patient declines pelvic exam in the absence of complaints.  Clinical breast exam performed.      Relevant Orders   CBC with Differential/Platelet   Comprehensive metabolic panel   Hemoglobin A1c   TSH   VITAMIN D 25 Hydroxy (Vit-D Deficiency, Fractures)   Lipid panel   B12 and  Folate Panel   Ambulatory referral to Dermatology   GAD (generalized anxiety disorder)    Improved.  Will follow      Fatigue    Suspect multifactorial.  Pending labs.  Discussed with patient sleep hygiene, turning off the TV, cell phone prior to bedtime.  She also tends to exercise after work, advised that this might be stimulating for her.  Declines sleep study.  Patient will let me know if no improvement      Relevant Orders   TSH   VITAMIN D 25 Hydroxy (Vit-D Deficiency, Fractures)   Numbness and tingling of left leg    Intermittent.  Symptoms not present today.  Benign exam.  Pending labs.  Patient will let me know if worsens, persists.      Relevant Orders   Hemoglobin A1c   TSH   B12 and Folate Panel  I am having [name] maintain her amLODipine and etonogestrel-ethinyl estradiol.   No orders of the defined types were placed in this encounter.   Return precautions given.   Risks, benefits, and alternatives of the medications and treatment plan prescribed today were discussed, and patient expressed understanding.   Education regarding symptom management and diagnosis given to patient on AVS.   Continue to follow with [name], FNP for routine health maintenance.   [name] and I agreed with plan.   Rennie Plowman, FNP

## 2018-05-17 LAB — COMPREHENSIVE METABOLIC PANEL
A/G RATIO: 1.4 (ref 1.2–2.2)
ALBUMIN: 4.2 g/dL (ref 3.5–5.5)
ALT: 13 IU/L (ref 0–32)
AST: 13 IU/L (ref 0–40)
Alkaline Phosphatase: 59 IU/L (ref 39–117)
BUN/Creatinine Ratio: 11 (ref 9–23)
BUN: 10 mg/dL (ref 6–20)
Bilirubin Total: 0.5 mg/dL (ref 0.0–1.2)
CALCIUM: 9.5 mg/dL (ref 8.7–10.2)
CO2: 23 mmol/L (ref 20–29)
Chloride: 103 mmol/L (ref 96–106)
Creatinine, Ser: 0.88 mg/dL (ref 0.57–1.00)
GFR, EST AFRICAN AMERICAN: 103 mL/min/{1.73_m2} (ref >59)
GFR, EST NON AFRICAN AMERICAN: 90 mL/min/{1.73_m2} (ref >59)
Globulin, Total: 2.9 g/dL (ref 1.5–4.5)
Glucose: 84 mg/dL (ref 65–99)
POTASSIUM: 4.4 mmol/L (ref 3.5–5.2)
SODIUM: 141 mmol/L (ref 134–144)
TOTAL PROTEIN: 7.1 g/dL (ref 6.0–8.5)

## 2018-05-17 LAB — CBC WITH DIFFERENTIAL/PLATELET
BASOS: 0 %
Basophils Absolute: 0 10*3/uL (ref 0.0–0.2)
EOS (ABSOLUTE): 0 10*3/uL (ref 0.0–0.4)
EOS: 0 %
HEMATOCRIT: 37.7 % (ref 34.0–46.6)
HEMOGLOBIN: 12.2 g/dL (ref 11.1–15.9)
IMMATURE GRANULOCYTES: 0 %
Immature Grans (Abs): 0 10*3/uL (ref 0.0–0.1)
LYMPHS ABS: 3.3 10*3/uL — AB (ref 0.7–3.1)
Lymphs: 28 %
MCH: 23.1 pg — ABNORMAL LOW (ref 26.6–33.0)
MCHC: 32.4 g/dL (ref 31.5–35.7)
MCV: 71 fL — AB (ref 79–97)
MONOCYTES: 6 %
Monocytes Absolute: 0.7 10*3/uL (ref 0.1–0.9)
NEUTROS PCT: 66 %
Neutrophils Absolute: 7.5 10*3/uL — ABNORMAL HIGH (ref 1.4–7.0)
Platelets: 382 10*3/uL (ref 150–450)
RBC: 5.29 x10E6/uL — AB (ref 3.77–5.28)
RDW: 16 % — ABNORMAL HIGH (ref 12.3–15.4)
WBC: 11.5 10*3/uL — ABNORMAL HIGH (ref 3.4–10.8)

## 2018-05-17 LAB — LIPID PANEL
CHOL/HDL RATIO: 3 ratio (ref 0.0–4.4)
Cholesterol, Total: 152 mg/dL (ref 100–199)
HDL: 51 mg/dL (ref >39)
LDL CALC: 72 mg/dL (ref 0–99)
TRIGLYCERIDES: 144 mg/dL (ref 0–149)
VLDL Cholesterol Cal: 29 mg/dL (ref 5–40)

## 2018-05-17 LAB — B12 AND FOLATE PANEL
Folate: >20 ng/mL (ref >3.0)
Vitamin B-12: 582 pg/mL (ref 232–1245)

## 2018-05-17 LAB — HEMOGLOBIN A1C
Est. average glucose Bld gHb Est-mCnc: 100 mg/dL
Hgb A1c MFr Bld: 5.1 % (ref 4.8–5.6)

## 2018-05-17 LAB — VITAMIN D 25 HYDROXY (VIT D DEFICIENCY, FRACTURES): Vit D, 25-Hydroxy: 40.3 ng/mL (ref 30.0–100.0)

## 2018-05-17 LAB — TSH: TSH: 3.2 u[IU]/mL (ref 0.450–4.500)

## 2018-05-18 ENCOUNTER — Other Ambulatory Visit: Payer: Self-pay | Admitting: Family

## 2018-05-18 DIAGNOSIS — D649 Anemia, unspecified: Secondary | ICD-10-CM

## 2018-06-24 ENCOUNTER — Encounter: Payer: Self-pay | Admitting: Obstetrics & Gynecology

## 2018-06-24 ENCOUNTER — Ambulatory Visit (INDEPENDENT_AMBULATORY_CARE_PROVIDER_SITE_OTHER): Payer: Managed Care, Other (non HMO) | Admitting: Obstetrics & Gynecology

## 2018-06-24 VITALS — BP 120/80 | Ht 68.0 in | Wt 175.0 lb

## 2018-06-24 DIAGNOSIS — Z01419 Encounter for gynecological examination (general) (routine) without abnormal findings: Secondary | ICD-10-CM

## 2018-06-24 DIAGNOSIS — Z3049 Encounter for surveillance of other contraceptives: Secondary | ICD-10-CM

## 2018-06-24 DIAGNOSIS — Z0000 Encounter for general adult medical examination without abnormal findings: Secondary | ICD-10-CM

## 2018-06-24 DIAGNOSIS — Z3040 Encounter for surveillance of contraceptives, unspecified: Secondary | ICD-10-CM | POA: Insufficient documentation

## 2018-06-24 DIAGNOSIS — Z7689 Persons encountering health services in other specified circumstances: Secondary | ICD-10-CM

## 2018-06-24 DIAGNOSIS — Z124 Encounter for screening for malignant neoplasm of cervix: Secondary | ICD-10-CM

## 2018-06-24 MED ORDER — ETONOGESTREL-ETHINYL ESTRADIOL 0.12-0.015 MG/24HR VA RING: VAGINAL_RING | VAGINAL | 3 refills | Status: DC

## 2018-06-24 NOTE — Progress Notes (Addendum)
HPI:      [name] is a 27 y.o. G1P0010 who LMP was Patient's last menstrual period was 06/10/2018., she presents today for her annual examination. The patient has no complaints today. The patient is sexually active. Her last pap: was normal. The patient does perform self breast exams.  There is no notable family history of breast or ovarian cancer in her family.  The patient has regular exercise: yes.  The patient denies current symptoms of depression.    GYN History: Contraception: NuvaRing vaginal inserts  PMHx: Past Medical History:  Diagnosis Date  . Anemia   . GAD (generalized anxiety disorder)   . Hypertension   . 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 NO PAST SURGERIES     Family History  Problem Relation Age of Onset  . Colon cancer Maternal Grandmother 60  . Breast cancer Maternal Grandmother 68  . Breast cancer Maternal Aunt    Social History   Tobacco Use  . Smoking status: Never Smoker  . Smokeless tobacco: Never Used  Substance Use Topics  . Alcohol use: Yes    Comment: socially  . Drug use: No    Current Outpatient Medications:  .  amLODipine (NORVASC) 2.5 MG tablet, Take 1 tablet (2.5 mg total) by mouth daily., Disp: 90 tablet, Rfl: 1 .  etonogestrel-ethinyl estradiol (NUVARING) 0.12-0.015 MG/24HR vaginal ring, Insert vaginally and leave in place for 3 consecutive weeks, then remove for 1 week., Disp: 3 each, Rfl: 4 Allergies: Patient has no known allergies.  ROS  Review of Systems - Negative except as above   Objective: BP 120/80   Ht 5\' 8"  (1.727 m)   Wt 175 lb (79.4 kg)   LMP 06/10/2018   BMI 26.61 kg/m   Filed Weights   06/24/18 1542  Weight: 175 lb (79.4 kg)   Body mass index is 26.61 kg/m. OBGyn Exam Physical examination Constitutional NAD, Conversant  HEENT Atraumatic; Op clear with mmm.  Normo-cephalic. Pupils  reactive. Anicteric sclerae  Thyroid/Neck Smooth without nodularity or enlargement. Normal ROM.  Neck Supple.  Skin No rashes, lesions or ulceration. Normal palpated skin turgor. No nodularity.  Breasts: No masses or discharge.  Symmetric.  No axillary adenopathy.  Lungs: Clear to auscultation.No rales or wheezes. Normal Respiratory effort, no retractions.  Heart: NSR.  No murmurs or rubs appreciated. No periferal edema  Abdomen: Soft.  Non-tender.  No masses.  No HSM. No hernia  Extremities: Moves all appropriately.  Normal ROM for age. No lymphadenopathy.  Neuro: Grossly intact  Psych: Oriented to PPT.  Normal mood. Normal affect.     Pelvic:   Vulva: Normal appearance.  No lesions.  Vagina: No lesions or abnormalities noted.  Support: Normal pelvic support.  Urethra No masses tenderness or scarring.  Meatus Normal size without lesions or prolapse.  Cervix: Normal appearance.  No lesions.  Anus: Normal exam.  No lesions.  Perineum: Normal exam.  No lesions.        Bimanual   Uterus: Normal size.  Non-tender.  Mobile.  AV.  Adnexae: No masses.  Non-tender to palpation.  Cul-de-sac: Negative for abnormality.   Assessment:  ANNUAL EXAM 1. Annual physical exam   2. Screening for cervical cancer   3. Encounter for surveillance of other contraceptive      Screening Plan:            1.  Cervical Screening-  Pap smear done today  2. Breast screening- Exam annually and mammogram>40 planned   3. Colonoscopy every 10 years, Hemoccult testing - after age 29  4. Labs Ordered today for STD surveillance per request  5. Counseling for contraception: NuvaRing vaginal inserts    F/U  Return in about 1 year (around 06/25/2019) for Annual.  Kim MajorPaul Sam Overbeck, MD, Merlinda FrederickFACOG [facility], [facility] 06/24/2018  4:07 PM

## 2018-06-26 LAB — HSV(HERPES SMPLX)ABS-I+II(IGG+IGM)-BLD
HSV 1 Glycoprotein G Ab, IgG: <0.91 index (ref 0.00–0.90)
HSV 2 IgG, Type Spec: <0.91 index (ref 0.00–0.90)
HSVI/II Comb IgM: 1.28 Ratio — ABNORMAL HIGH (ref 0.00–0.90)

## 2018-06-26 LAB — HEP, RPR, HIV PANEL
HEP B S AG: NEGATIVE
HIV Screen 4th Generation wRfx: NONREACTIVE
RPR: REACTIVE — AB

## 2018-06-26 LAB — RPR, QUANT. (REFLEX): Rapid Plasma Reagin, Quant: 1:1 {titer} — ABNORMAL HIGH

## 2018-06-27 ENCOUNTER — Telehealth: Payer: Self-pay

## 2018-06-27 ENCOUNTER — Other Ambulatory Visit: Payer: Self-pay | Admitting: Obstetrics & Gynecology

## 2018-06-27 NOTE — Telephone Encounter (Signed)
Kim Reeves calling from [facility] regarding a result on 7/22. Patient had a reactive RPR. Was there a confirmatory test ordered? If it was not [facility] will hold specimen x7 days and test can be added on.  CB# [id number]

## 2018-06-27 NOTE — Telephone Encounter (Signed)
That was added on today already

## 2018-06-28 ENCOUNTER — Other Ambulatory Visit: Payer: Self-pay | Admitting: Obstetrics & Gynecology

## 2018-06-28 IMAGING — DX DG KNEE COMPLETE 4+V*R*
5 series · 5 of 5 positions shown · non-contrast
Comparison: 04/19/2015

CLINICAL DATA: Right knee pain for 3 weeks.

EXAM:
RIGHT KNEE - COMPLETE 4+ VIEW

[knee standing ap]
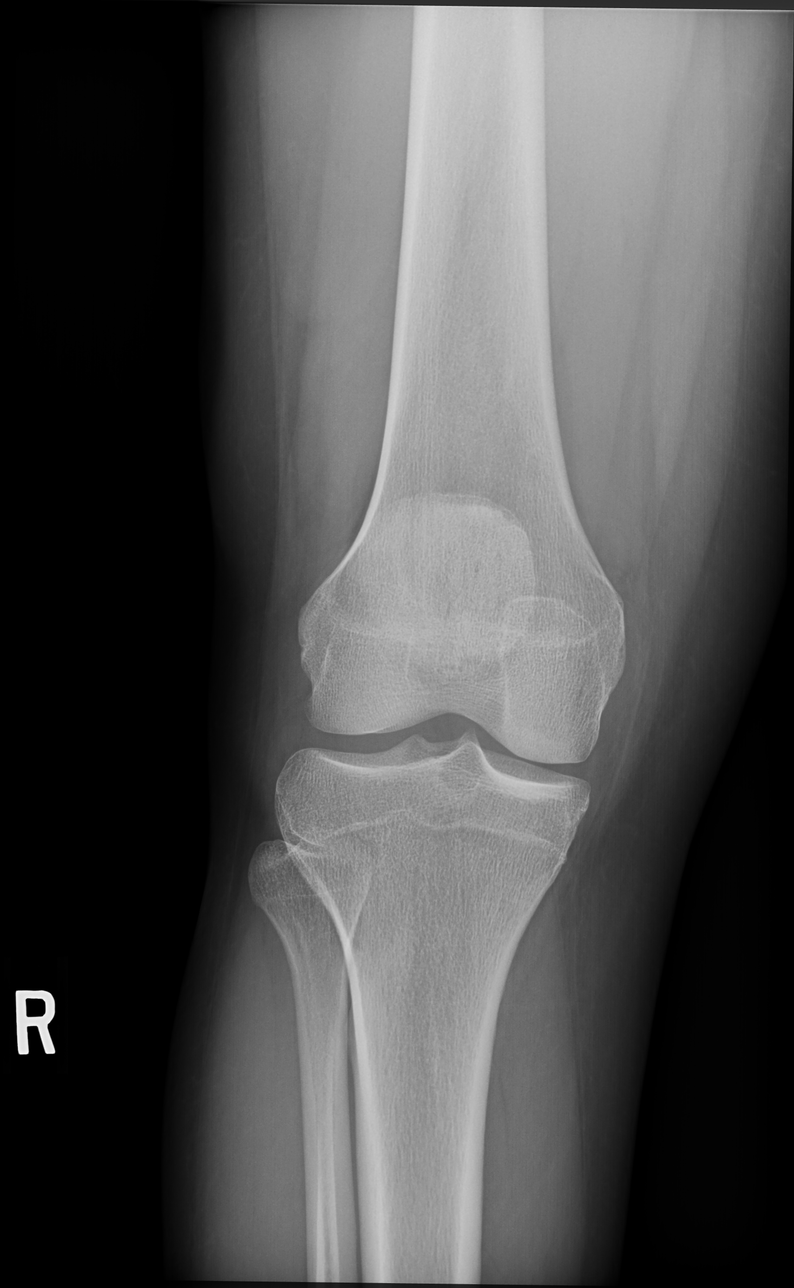

[knee standing external ap]
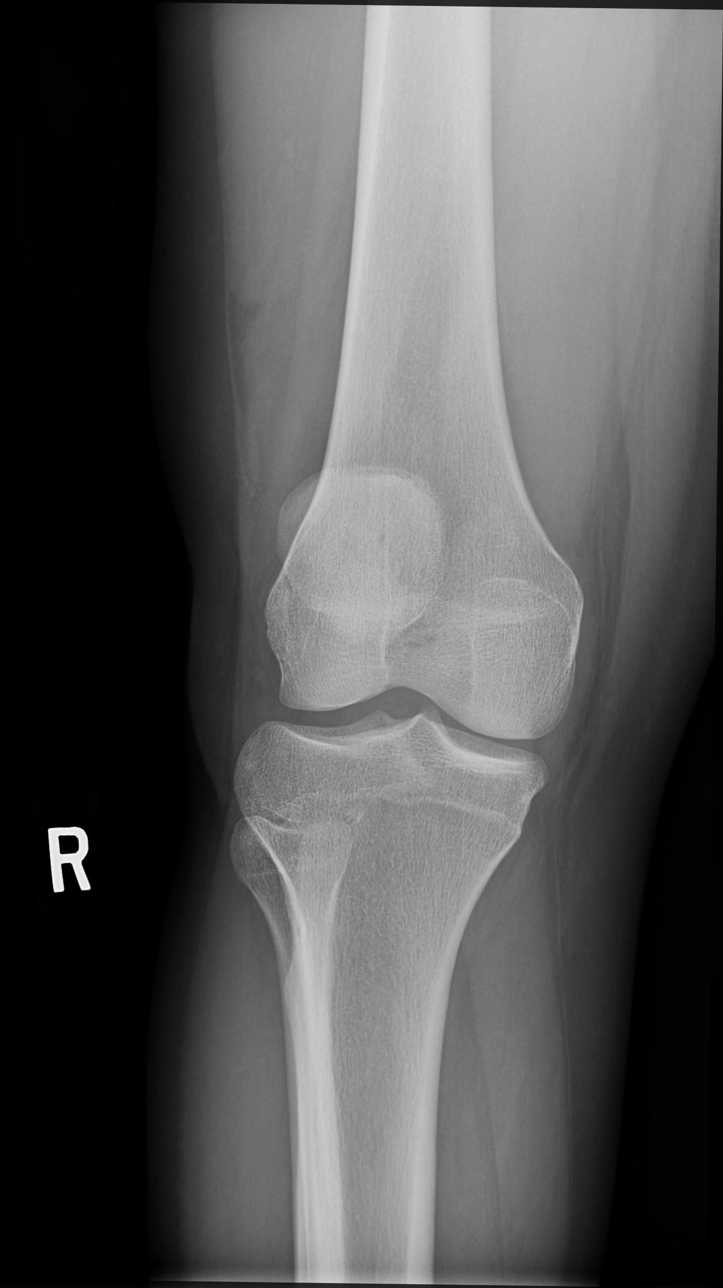

[knee standing internal ap]
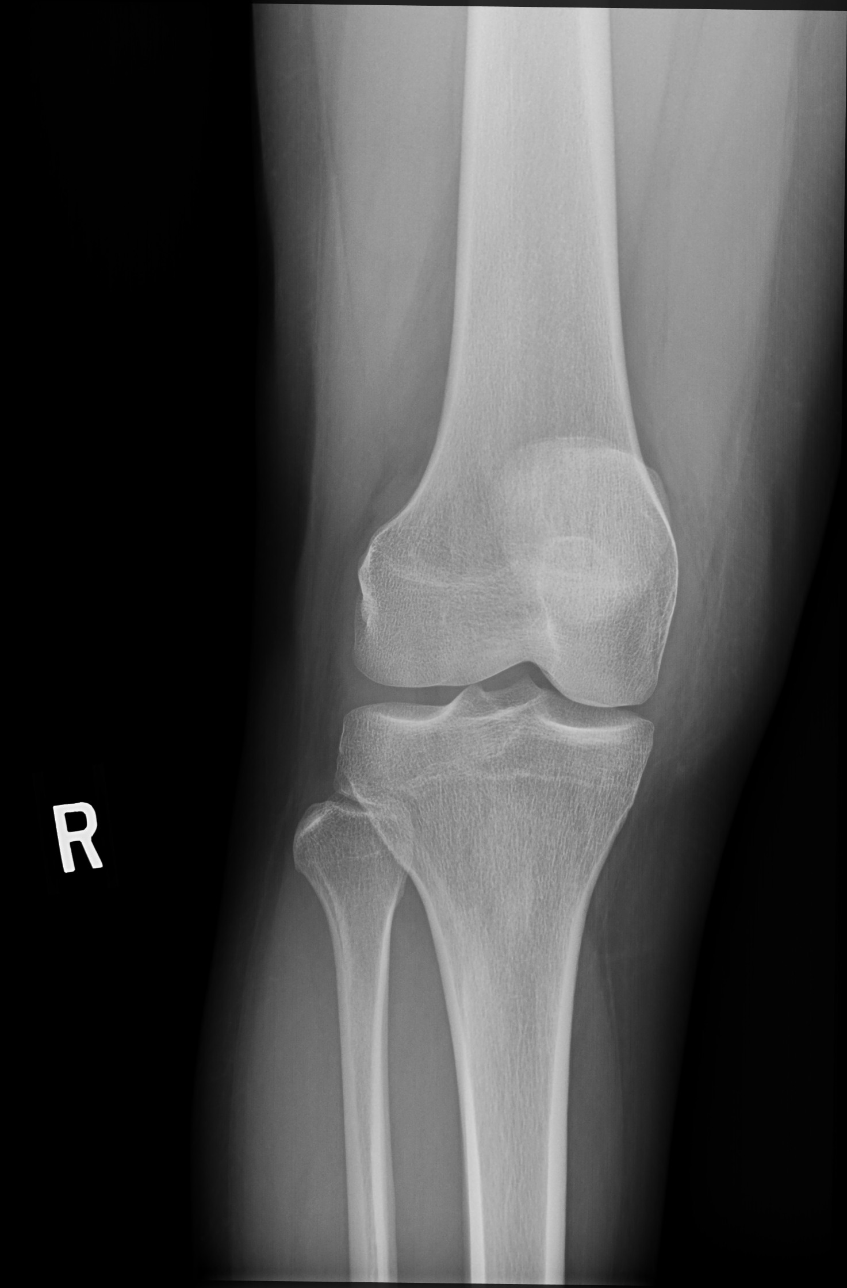

[knee standing lat]
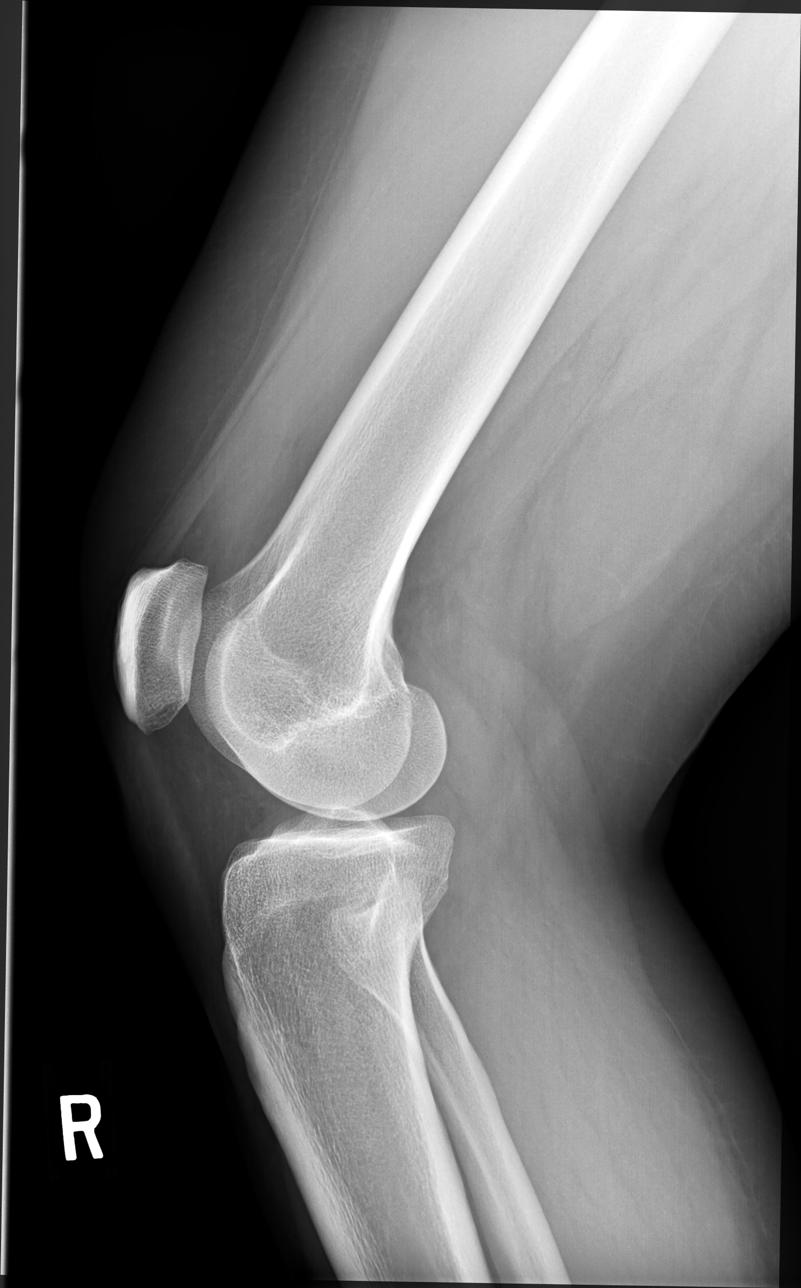

[knee [person_name] view pa]
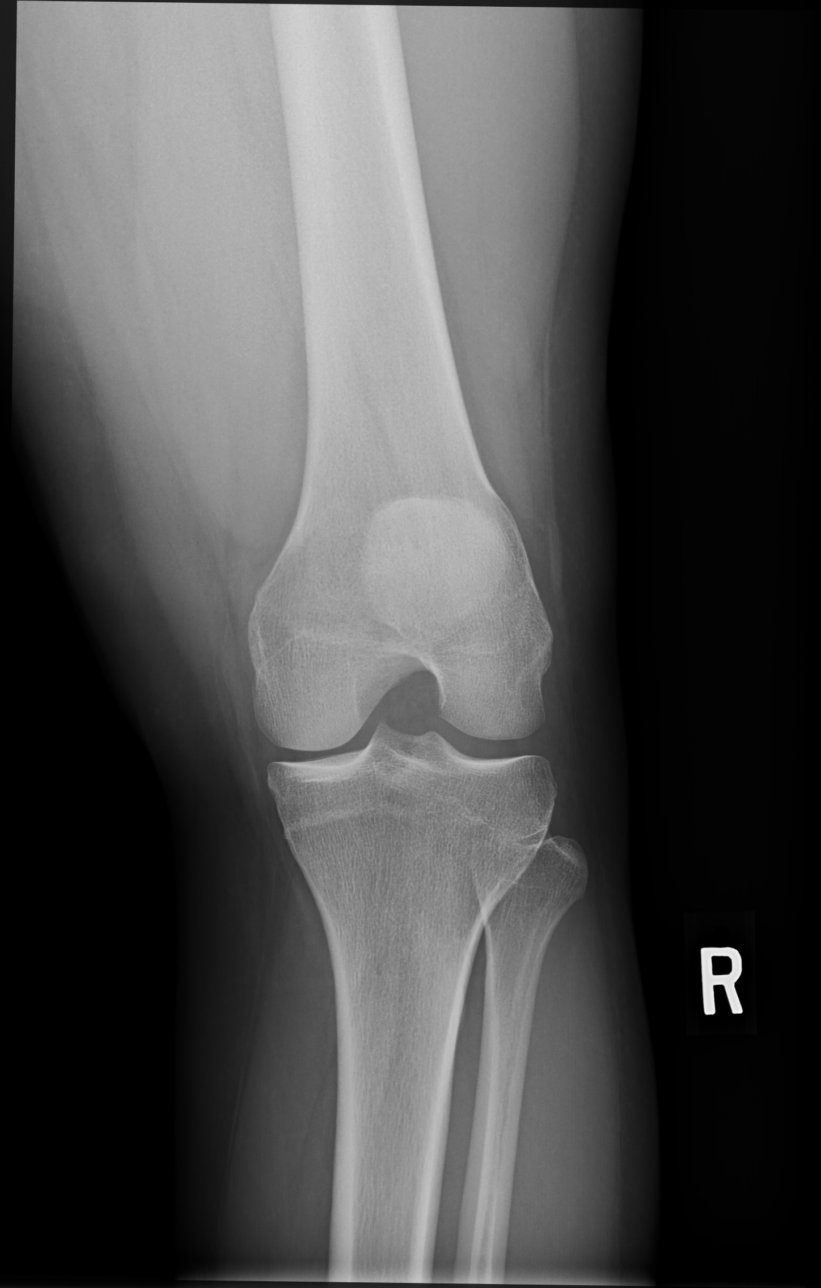

[5 of 5 positions shown; findings below may reference images not displayed]

FINDINGS: No evidence of fracture, dislocation, or joint effusion. No evidence
of arthropathy or other focal bone abnormality. Soft tissues are
unremarkable.
IMPRESSION: Negative.

## 2018-07-01 ENCOUNTER — Telehealth: Payer: Self-pay | Admitting: Obstetrics & Gynecology

## 2018-07-01 ENCOUNTER — Other Ambulatory Visit: Payer: Self-pay | Admitting: Obstetrics & Gynecology

## 2018-07-01 DIAGNOSIS — A530 Latent syphilis, unspecified as early or late: Secondary | ICD-10-CM

## 2018-07-01 DIAGNOSIS — R899 Unspecified abnormal finding in specimens from other organs, systems and tissues: Secondary | ICD-10-CM

## 2018-07-01 NOTE — Progress Notes (Signed)
RPR pos, 1:1 then neg on recheck by same specimen They rec retest w cascade testing on re-draw Will discuss and retest soon Annamarie MajorPaul Kaislyn Gulas, MD, Merlinda FrederickFACOG [facility], [facility] 07/01/2018  10:38 AM

## 2018-07-01 NOTE — Telephone Encounter (Signed)
Pt plans to come tomorrow 3:00 for labs.  Orders are in

## 2018-07-01 NOTE — Telephone Encounter (Signed)
Patient is calling for labs results. Please advise. 

## 2018-07-02 ENCOUNTER — Other Ambulatory Visit: Payer: Managed Care, Other (non HMO)

## 2018-07-02 DIAGNOSIS — R899 Unspecified abnormal finding in specimens from other organs, systems and tissues: Secondary | ICD-10-CM

## 2018-07-02 DIAGNOSIS — A530 Latent syphilis, unspecified as early or late: Secondary | ICD-10-CM

## 2018-07-03 LAB — IGP,CTNGTV,RFX APTIMA HPV ASCU
CHLAMYDIA, NUC. ACID AMP: NEGATIVE
Gonococcus, Nuc. Acid Amp: NEGATIVE
PAP Smear Comment: 0
TRICH VAG BY NAA: NEGATIVE

## 2018-07-03 LAB — HPV APTIMA: HPV APTIMA: POSITIVE — AB

## 2018-07-04 ENCOUNTER — Encounter: Payer: Self-pay | Admitting: Obstetrics & Gynecology

## 2018-07-04 ENCOUNTER — Telehealth: Payer: Self-pay | Admitting: Obstetrics & Gynecology

## 2018-07-04 ENCOUNTER — Telehealth: Payer: Self-pay

## 2018-07-04 LAB — T PALLIDUM SCREENING CASCADE: T PALLIDUM ANTIBODIES (TP-PA): NEGATIVE

## 2018-07-04 NOTE — Telephone Encounter (Signed)
Called and left voice mail for patient to call back to be schedule °

## 2018-07-04 NOTE — Progress Notes (Signed)
Sch COLPO for patient, shes aware

## 2018-07-04 NOTE — Progress Notes (Signed)
Let her labs we retested for were negative

## 2018-07-04 NOTE — Telephone Encounter (Signed)
Pt called triage line, had questions about some results, left message for her to return my call

## 2018-07-04 NOTE — Telephone Encounter (Signed)
-----   Message from Nadara Mustardobert P Harris, MD sent at 07/04/2018 10:20 AM EDT ----- Sch COLPO for patient, shes aware

## 2018-07-04 NOTE — Telephone Encounter (Signed)
rph discussed results with pt 

## 2018-07-04 NOTE — Progress Notes (Signed)
Pt aware.

## 2018-07-10 NOTE — Telephone Encounter (Signed)
Called and left voice mail for patient to call back to be schedule °

## 2018-07-10 NOTE — Telephone Encounter (Signed)
[facility]

## 2018-07-11 NOTE — Telephone Encounter (Signed)
This encounter was created in error - please disregard.

## 2018-07-19 LAB — RPR: RPR Ser Ql: NONREACTIVE

## 2018-07-19 LAB — SPECIMEN STATUS REPORT

## 2018-07-26 ENCOUNTER — Encounter: Payer: Self-pay | Admitting: Obstetrics & Gynecology

## 2018-07-26 ENCOUNTER — Ambulatory Visit (INDEPENDENT_AMBULATORY_CARE_PROVIDER_SITE_OTHER): Payer: Managed Care, Other (non HMO) | Admitting: Obstetrics & Gynecology

## 2018-07-26 ENCOUNTER — Other Ambulatory Visit (HOSPITAL_COMMUNITY)
Admission: RE | Admit: 2018-07-26 | Discharge: 2018-07-26 | Disposition: A | Discharge status: Home / Self Care | Payer: Managed Care, Other (non HMO) | Source: Ambulatory Visit | Attending: Obstetrics & Gynecology | Admitting: Obstetrics & Gynecology

## 2018-07-26 VITALS — BP 120/80 | Ht 68.0 in | Wt 173.0 lb

## 2018-07-26 DIAGNOSIS — N870 Mild cervical dysplasia: Secondary | ICD-10-CM | POA: Diagnosis not present

## 2018-07-26 DIAGNOSIS — R87610 Atypical squamous cells of undetermined significance on cytologic smear of cervix (ASC-US): Secondary | ICD-10-CM

## 2018-07-26 DIAGNOSIS — R87810 Cervical high risk human papillomavirus (HPV) DNA test positive: Secondary | ICD-10-CM

## 2018-07-26 NOTE — Patient Instructions (Signed)

## 2018-07-26 NOTE — Progress Notes (Signed)
Referring Provider:  self  HPI:  Kim Reeves is a 27 y.o.  G1P0010  who presents today for evaluation and management of abnormal cervical cytology.    Dysplasia History:  ASCUS w POS HPV  ROS:  Pertinent items are noted in HPI.  OB History  Gravida Para Term Preterm AB Living  1       1 0  SAB TAB Ectopic Multiple Live Births  1            # Outcome Date GA Lbr Len/2nd Weight Sex Delivery Anes PTL Lv  1 SAB             Past Medical History:  Diagnosis Date  . Anemia   . GAD (generalized anxiety disorder)   . Hypertension   . 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 NO PAST SURGERIES      SOCIAL HISTORY: Social History   Substance and Sexual Activity  Alcohol Use Yes   Comment: socially   Social History   Substance and Sexual Activity  Drug Use No     Family History  Problem Relation Age of Onset  . Colon cancer Maternal Grandmother 60  . Breast cancer Maternal Grandmother 7570  . Breast cancer Maternal Aunt     ALLERGIES:  Patient has no known allergies.  Current Outpatient Medications on File Prior to Visit  Medication Sig Dispense Refill  . amLODipine (NORVASC) 2.5 MG tablet Take 1 tablet (2.5 mg total) by mouth daily. 90 tablet 1  . etonogestrel-ethinyl estradiol (NUVARING) 0.12-0.015 MG/24HR vaginal ring Insert vaginally and leave in place for 3 consecutive weeks, then remove for 1 week. 3 each 3   No current facility-administered medications on file prior to visit.     Physical Exam: -Vitals:  BP 120/80   Ht 5\' 8"  (1.727 m)   Wt 173 lb (78.5 kg)   LMP 07/08/2018   BMI 26.30 kg/m  GEN: WD, WN, NAD.  A+ O x 3, good mood and affect. ABD:  NT, ND.  Soft, no masses.  No hernias noted.   Pelvic:   Vulva: Normal appearance.  No lesions.  Vagina: No lesions or abnormalities noted.  Support: Normal pelvic support.  Urethra No masses  tenderness or scarring.  Meatus Normal size without lesions or prolapse.  Cervix: See below.  Anus: Normal exam.  No lesions.  Perineum: Normal exam.  No lesions.        Bimanual   Uterus: Normal size.  Non-tender.  Mobile.  AV.  Adnexae: No masses.  Non-tender to palpation.  Cul-de-sac: Negative for abnormality.   PROCEDURE: 1.  Urine Pregnancy Test:  not done 2.  Colposcopy performed with 4% acetic acid after verbal consent obtained                                         -Aceto-white Lesions Location(s): all around, ectropion w wide TZ.              -Biopsy performed at 6, 12 o'clock               -ECC indicated and performed: Yes.       -Biopsy sites made hemostatic with pressure, AgNO3, and/or Monsel's solution   -Satisfactory colposcopy: Yes.      -Evidence of Invasive  cervical CA :  NO  ASSESSMENT:  Kim Reeves is a 27 y.o. G1P0010 here for  1. ASCUS with positive high risk HPV cervical    PLAN: 1.  I discussed the grading system of pap smears and HPV high risk viral types.  We will discuss and base management after colpo results return. 2. Follow up PAP 6 months, vs intervention if high grade dysplasia identified 3. Treatment of persistantly abnormal PAP smears and cervical dysplasia, even mild, is discussed w pt today in detail, as well as the pros and cons of Cryo and LEEP procedures. Will consider and discuss after results.      Annamarie Major, MD, [facility] Frederick Ob/Gyn, [facility] 07/26/2018  2:25 PM

## 2018-07-31 NOTE — Progress Notes (Signed)
PAP ASCUS COLPO CIN I PLAN PAP 6 mos

## 2018-08-02 ENCOUNTER — Other Ambulatory Visit: Payer: Self-pay | Admitting: Obstetrics & Gynecology

## 2018-09-12 ENCOUNTER — Ambulatory Visit: Payer: Managed Care, Other (non HMO) | Admitting: Family Medicine

## 2018-09-12 DIAGNOSIS — Z0289 Encounter for other administrative examinations: Secondary | ICD-10-CM

## 2019-02-03 ENCOUNTER — Encounter (HOSPITAL_COMMUNITY): Payer: Self-pay

## 2019-02-03 ENCOUNTER — Emergency Department (HOSPITAL_COMMUNITY)
Admission: EM | Admit: 2019-02-03 | Discharge: 2019-02-04 | Disposition: A | Discharge status: Home / Self Care | Payer: Managed Care, Other (non HMO) | Attending: Emergency Medicine | Admitting: Emergency Medicine

## 2019-02-03 ENCOUNTER — Other Ambulatory Visit: Payer: Self-pay

## 2019-02-03 DIAGNOSIS — T7421XA Adult sexual abuse, confirmed, initial encounter: Secondary | ICD-10-CM | POA: Insufficient documentation

## 2019-02-03 DIAGNOSIS — Z79899 Other long term (current) drug therapy: Secondary | ICD-10-CM | POA: Diagnosis not present

## 2019-02-03 DIAGNOSIS — I10 Essential (primary) hypertension: Secondary | ICD-10-CM | POA: Insufficient documentation

## 2019-02-03 DIAGNOSIS — T7621XA Adult sexual abuse, suspected, initial encounter: Secondary | ICD-10-CM | POA: Diagnosis present

## 2019-02-03 LAB — URINALYSIS, ROUTINE W REFLEX MICROSCOPIC
Bilirubin Urine: NEGATIVE
Glucose, UA: NEGATIVE mg/dL
Ketones, ur: NEGATIVE mg/dL
Nitrite: NEGATIVE
Protein, ur: NEGATIVE mg/dL
Specific Gravity, Urine: 1.026 (ref 1.005–1.030)
pH: 6 (ref 5.0–8.0)

## 2019-02-03 LAB — POC URINE PREG, ED: PREG TEST UR: NEGATIVE
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
## 2019-02-04 ENCOUNTER — Ambulatory Visit (HOSPITAL_COMMUNITY)
Admission: EM | Admit: 2019-02-04 | Discharge: 2019-02-04 | Disposition: A | Discharge status: Home / Self Care | Payer: No Typology Code available for payment source | Source: Ambulatory Visit | Attending: Emergency Medicine | Admitting: Emergency Medicine

## 2019-02-04 ENCOUNTER — Ambulatory Visit (INDEPENDENT_AMBULATORY_CARE_PROVIDER_SITE_OTHER): Payer: Managed Care, Other (non HMO) | Admitting: Obstetrics & Gynecology

## 2019-02-04 ENCOUNTER — Encounter: Payer: Self-pay | Admitting: Obstetrics & Gynecology

## 2019-02-04 ENCOUNTER — Other Ambulatory Visit (HOSPITAL_COMMUNITY)
Admission: RE | Admit: 2019-02-04 | Discharge: 2019-02-04 | Disposition: A | Discharge status: Home / Self Care | Payer: Managed Care, Other (non HMO) | Source: Ambulatory Visit | Attending: Obstetrics & Gynecology | Admitting: Obstetrics & Gynecology

## 2019-02-04 VITALS — BP 120/80 | Ht 68.0 in | Wt 174.0 lb

## 2019-02-04 DIAGNOSIS — R87810 Cervical high risk human papillomavirus (HPV) DNA test positive: Secondary | ICD-10-CM | POA: Insufficient documentation

## 2019-02-04 DIAGNOSIS — Z91410 Personal history of adult physical and sexual abuse: Secondary | ICD-10-CM | POA: Diagnosis not present

## 2019-02-04 DIAGNOSIS — R87612 Low grade squamous intraepithelial lesion on cytologic smear of cervix (LGSIL): Secondary | ICD-10-CM | POA: Diagnosis not present

## 2019-02-04 DIAGNOSIS — Z0441 Encounter for examination and observation following alleged adult rape: Secondary | ICD-10-CM | POA: Diagnosis present

## 2019-02-04 DIAGNOSIS — Z202 Contact with and (suspected) exposure to infections with a predominantly sexual mode of transmission: Secondary | ICD-10-CM

## 2019-02-04 DIAGNOSIS — N898 Other specified noninflammatory disorders of vagina: Secondary | ICD-10-CM

## 2019-02-04 DIAGNOSIS — R87610 Atypical squamous cells of undetermined significance on cytologic smear of cervix (ASC-US): Secondary | ICD-10-CM
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
## 2019-02-05 LAB — HEP, RPR, HIV PANEL
HIV SCREEN 4TH GENERATION: NONREACTIVE
Hepatitis B Surface Ag: NEGATIVE
RPR Ser Ql: NONREACTIVE

## 2019-02-06 LAB — CERVICOVAGINAL ANCILLARY ONLY
Bacterial vaginitis: NEGATIVE
CANDIDA VAGINITIS: POSITIVE — AB
Chlamydia: NEGATIVE
Neisseria Gonorrhea: NEGATIVE
Trichomonas: NEGATIVE

## 2019-02-07 ENCOUNTER — Other Ambulatory Visit: Payer: Self-pay | Admitting: Obstetrics & Gynecology

## 2019-02-07 LAB — CYTOLOGY - PAP: HPV: DETECTED — AB

## 2019-02-07 MED ORDER — FLUCONAZOLE 150 MG PO TABS: 150.0000 mg | ORAL_TABLET | Freq: Once | ORAL | 3 refills | Status: AC
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
## 2019-06-04 ENCOUNTER — Encounter: Payer: Self-pay | Admitting: Family

## 2019-06-04 ENCOUNTER — Ambulatory Visit (INDEPENDENT_AMBULATORY_CARE_PROVIDER_SITE_OTHER): Payer: Managed Care, Other (non HMO) | Admitting: Family

## 2019-06-04 ENCOUNTER — Other Ambulatory Visit: Payer: Self-pay

## 2019-06-04 DIAGNOSIS — F411 Generalized anxiety disorder: Secondary | ICD-10-CM

## 2019-06-04 DIAGNOSIS — G43109 Migraine with aura, not intractable, without status migrainosus: Secondary | ICD-10-CM

## 2019-06-04 DIAGNOSIS — I10 Essential (primary) hypertension: Secondary | ICD-10-CM

## 2019-06-04 DIAGNOSIS — G43909 Migraine, unspecified, not intractable, without status migrainosus: Secondary | ICD-10-CM | POA: Insufficient documentation
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
## 2019-06-04 NOTE — Assessment & Plan Note (Addendum)
BP Readings from Last 3 Encounters:  02/04/19 120/80  02/04/19 (!) 144/99  07/26/18 120/80   Uncontrolled. We discussed my suspicion that constellation of symptoms may be related to HTN. She will use amlodipine daily and we will increase after one week if not at goal. Advised however she declines MRI Brain until after she takes amlodipine consistently.She will let me know of any new or worsening symptoms.  Close follow up.

## 2019-06-04 NOTE — Assessment & Plan Note (Signed)
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 Suspect uncontrolled HTN, GAD contributory. Advised MRI Brain, she declines at this time. Will discuss further at follow up.

## 2019-06-04 NOTE — Patient Instructions (Addendum)
Please take amlodipine every day in the evening. Goal of blood pressure less than 120/80.  Please let  Me know if headaches, numbness were to worsen or new symptoms were to develop.  Close follow up in ONE week, sooner if you need.   Nice to see you and stay safe!   Managing Your Hypertension Hypertension is commonly called high blood pressure. This is when the force of your blood pressing against the walls of your arteries is too strong. Arteries are blood vessels that carry blood from your heart throughout your body. Hypertension forces the heart to work harder to pump blood, and may cause the arteries to become narrow or stiff. Having untreated or uncontrolled hypertension can cause heart attack, stroke, kidney disease, and other problems. What are blood pressure readings? A blood pressure reading consists of a higher number over a lower number. Ideally, your blood pressure should be below 120/80. The first ("top") number is called the systolic pressure. It is a measure of the pressure in your arteries as your heart beats. The second ("bottom") number is called the diastolic pressure. It is a measure of the pressure in your arteries as the heart relaxes. What does my blood pressure reading mean? Blood pressure is classified into four stages. Based on your blood pressure reading, your health care provider may use the following stages to determine what type of treatment you need, if any. Systolic pressure and diastolic pressure are measured in a unit called mm Hg. Normal  Systolic pressure: below 120.  Diastolic pressure: below 80. Elevated  Systolic pressure: 120-129.  Diastolic pressure: below 80. Hypertension stage 1  Systolic pressure: 130-139.  Diastolic pressure: 80-89. Hypertension stage 2  Systolic pressure: 140 or above.  Diastolic pressure: 90 or above. What health risks are associated with hypertension? Managing your hypertension is an important responsibility.  Uncontrolled hypertension can lead to:  A heart attack.  A stroke.  A weakened blood vessel (aneurysm).  Heart failure.  Kidney damage.  Eye damage.  Metabolic syndrome.  Memory and concentration problems. What changes can I make to manage my hypertension? Hypertension can be managed by making lifestyle changes and possibly by taking medicines. Your health care provider will help you make a plan to bring your blood pressure within a normal range. Eating and drinking   Eat a diet that is high in fiber and potassium, and low in salt (sodium), added sugar, and fat. An example eating plan is called the DASH (Dietary Approaches to Stop Hypertension) diet. To eat this way: ? Eat plenty of fresh fruits and vegetables. Try to fill half of your plate at each meal with fruits and vegetables. ? Eat whole grains, such as whole wheat pasta, brown rice, or whole grain bread. Fill about one quarter of your plate with whole grains. ? Eat low-fat diary products. ? Avoid fatty cuts of meat, processed or cured meats, and poultry with skin. Fill about one quarter of your plate with lean proteins such as fish, chicken without skin, beans, eggs, and tofu. ? Avoid premade and processed foods. These tend to be higher in sodium, added sugar, and fat.  Reduce your daily sodium intake. Most people with hypertension should eat less than 1,500 mg of sodium a day.  Limit alcohol intake to no more than 1 drink a day for nonpregnant women and 2 drinks a day for men. One drink equals 12 oz of beer, 5 oz of wine, or 1 oz of hard liquor. Lifestyle  Work  with your health care provider to maintain a healthy body weight, or to lose weight. Ask what an ideal weight is for you.  Get at least 30 minutes of exercise that causes your heart to beat faster (aerobic exercise) most days of the week. Activities may include walking, swimming, or biking.  Include exercise to strengthen your muscles (resistance exercise), such  as weight lifting, as part of your weekly exercise routine. Try to do these types of exercises for 30 minutes at least 3 days a week.  Do not use any products that contain nicotine or tobacco, such as cigarettes and e-cigarettes. If you need help quitting, ask your health care provider.  Control any long-term (chronic) conditions you have, such as high cholesterol or diabetes. Monitoring  Monitor your blood pressure at home as told by your health care provider. Your personal target blood pressure may vary depending on your medical conditions, your age, and other factors.  Have your blood pressure checked regularly, as often as told by your health care provider. Working with your health care provider  Review all the medicines you take with your health care provider because there may be side effects or interactions.  Talk with your health care provider about your diet, exercise habits, and other lifestyle factors that may be contributing to hypertension.  Visit your health care provider regularly. Your health care provider can help you create and adjust your plan for managing hypertension. Will I need medicine to control my blood pressure? Your health care provider may prescribe medicine if lifestyle changes are not enough to get your blood pressure under control, and if:  Your systolic blood pressure is 130 or higher.  Your diastolic blood pressure is 80 or higher. Take medicines only as told by your health care provider. Follow the directions carefully. Blood pressure medicines must be taken as prescribed. The medicine does not work as well when you skip doses. Skipping doses also puts you at risk for problems. Contact a health care provider if:  You think you are having a reaction to medicines you have taken.  You have repeated (recurrent) headaches.  You feel dizzy.  You have swelling in your ankles.  You have trouble with your vision. Get help right away if:  You develop a  severe headache or confusion.  You have unusual weakness or numbness, or you feel faint.  You have severe pain in your chest or abdomen.  You vomit repeatedly.  You have trouble breathing. Summary  Hypertension is when the force of blood pumping through your arteries is too strong. If this condition is not controlled, it may put you at risk for serious complications.  Your personal target blood pressure may vary depending on your medical conditions, your age, and other factors. For most people, a normal blood pressure is less than 120/80.  Hypertension is managed by lifestyle changes, medicines, or both. Lifestyle changes include weight loss, eating a healthy, low-sodium diet, exercising more, and limiting alcohol. This information is not intended to replace advice given to you by your health care provider. Make sure you discuss any questions you have with your health care provider. Document Released: 08/14/2012 Document Revised: 03/14/2019 Document Reviewed: 10/18/2016 Elsevier Patient Education  2020 [employer].

## 2019-06-22 ENCOUNTER — Telehealth: Payer: Managed Care, Other (non HMO) | Admitting: Nurse Practitioner

## 2019-06-22 DIAGNOSIS — J029 Acute pharyngitis, unspecified: Secondary | ICD-10-CM

## 2019-06-22 NOTE — Progress Notes (Signed)

## 2019-06-24 ENCOUNTER — Other Ambulatory Visit: Payer: Self-pay

## 2019-06-25 ENCOUNTER — Other Ambulatory Visit: Payer: Self-pay

## 2019-06-25 ENCOUNTER — Encounter: Payer: Self-pay | Admitting: Family

## 2019-06-25 ENCOUNTER — Ambulatory Visit (INDEPENDENT_AMBULATORY_CARE_PROVIDER_SITE_OTHER): Payer: Managed Care, Other (non HMO) | Admitting: Family

## 2019-06-25 VITALS — BP 122/78 | HR 87 | Temp 98.9°F | Ht 67.0 in | Wt 176.4 lb

## 2019-06-25 DIAGNOSIS — I10 Essential (primary) hypertension: Secondary | ICD-10-CM

## 2019-06-25 DIAGNOSIS — R5383 Other fatigue: Secondary | ICD-10-CM | POA: Diagnosis not present

## 2019-06-25 DIAGNOSIS — R030 Elevated blood-pressure reading, without diagnosis of hypertension: Secondary | ICD-10-CM

## 2019-06-25 DIAGNOSIS — Z0000 Encounter for general adult medical examination without abnormal findings: Secondary | ICD-10-CM

## 2019-06-25 DIAGNOSIS — G43109 Migraine with aura, not intractable, without status migrainosus: Secondary | ICD-10-CM | POA: Diagnosis not present

## 2019-06-25 DIAGNOSIS — Z3049 Encounter for surveillance of other contraceptives: Secondary | ICD-10-CM | POA: Diagnosis not present

## 2019-06-25 MED ORDER — AMLODIPINE BESYLATE 2.5 MG PO TABS: 2.5000 mg | ORAL_TABLET | Freq: Every day | ORAL | 1 refills | Status: DC

## 2019-06-25 NOTE — Progress Notes (Signed)
Subjective:    Patient ID: Kim Reeves Andreason, female    DOB: Apr 16, 1991, 28 y.o.   MRN: [id number]  CC: Kim Reeves Mcfarlane is a 28 y.o. female who presents today for physical exam.    HPI: Complains of continued fatigue for several months, unchanged.  FitBit does not show REM sleep. Occasionally wakes her self up with snoring.  Some trouble falling asleep and early morning awakenings.  She does endorse extreme fatigue in middle of the day even falls asleep at her desk on occasion. No h/o seizure.  Has not had a 'migraine' since starting amlodipine. When she has a HA , she does endorse photophobia. On occasion she will have flashing lights in one visual field with headache.  No vertigo, loss of vision, confusion.   She continues to experience numbness around the mouth.  She does not have the symptom today.  She feels that occurs more in the afternoon although she cannot identify any triggers.  Not worsened by eating, or anxiety.  Will occur at work or at home.  Appears random.  No trouble breathing or swallowing.   HTN- improved on amlodipine. Denies exertional chest pain or pressure, numbness or tingling radiating to left arm or jaw, palpitations, dizziness, or shortness of breath.    Colorectal Cancer Screening:No first degree relative.  Breast Cancer Screening: family h/o breast cancer. Declines genetic consult.  Cervical Cancer Screening: Abnormal. HPV positive, following with Dr Tiburcio PeaHarris; due for repeat per notes in chart 08/2019. Patient aware to schedule follow up.         Tetanus - utd      Labs: Screening labs today. Exercise: Gets regular exercise, walking.  Alcohol use: socially Smoking/tobacco use: Nonsmoker.  Regular dental exams: Wears seat belt: Yes.  HISTORY:  Past Medical History:  Diagnosis Date  . Anemia   . GAD (generalized anxiety disorder)   . Hypertension   . Major depressive disorder, single episode, severe without psychotic features (HCC) 01/25/2016    Past  Surgical History:  Procedure Laterality Date  . NO PAST SURGERIES     Family History  Problem Relation Age of Onset  . Colon cancer Maternal Grandmother 60  . Breast cancer Maternal Grandmother [id number]  . Breast cancer Paternal Aunt 40  . Colon cancer Maternal Aunt 52      ALLERGIES: Patient has no known allergies.  Current Outpatient Medications on File Prior to Visit  Medication Sig Dispense Refill  . etonogestrel-ethinyl estradiol (NUVARING) 0.12-0.015 MG/24HR vaginal ring Insert vaginally and leave in place for 3 consecutive weeks, then remove for 1 week. 3 each 3   No current facility-administered medications on file prior to visit.     Social History   Tobacco Use  . Smoking status: Never Smoker  . Smokeless tobacco: Never Used  Substance Use Topics  . Alcohol use: Yes    Comment: socially  . Drug use: No    Review of Systems  Constitutional: Negative for chills, fever and unexpected weight change.  HENT: Negative for congestion.   Respiratory: Negative for cough.   Cardiovascular: Negative for chest pain, palpitations and leg swelling.  Gastrointestinal: Negative for nausea and vomiting.  Musculoskeletal: Negative for arthralgias and myalgias.  Skin: Negative for rash.  Neurological: Positive for numbness and headaches.  Hematological: Negative for adenopathy.  Psychiatric/Behavioral: Positive for sleep disturbance. Negative for confusion and suicidal ideas.      Objective:    BP 122/78   Pulse 87   Temp 98.9 F (37.2  C)   Ht 5\' 7"  (1.702 m)   Wt 176 lb 6.4 oz (80 kg)   SpO2 98%   BMI 27.63 kg/m   BP Readings from Last 3 Encounters:  06/25/19 122/78  02/04/19 120/80  02/04/19 (!) 144/99   Wt Readings from Last 3 Encounters:  06/25/19 176 lb 6.4 oz (80 kg)  02/04/19 174 lb (78.9 kg)  02/03/19 173 lb (78.5 kg)    Physical Exam Vitals signs reviewed.  Constitutional:      Appearance: She is well-developed.  HENT:     Mouth/Throat:     Pharynx:  Uvula midline.  Eyes:     Conjunctiva/sclera: Conjunctivae normal.     Pupils: Pupils are equal, round, and reactive to light.     Comments: Fundus normal bilaterally.   Neck:     Thyroid: No thyroid mass or thyromegaly.  Cardiovascular:     Rate and Rhythm: Normal rate and regular rhythm.     Pulses: Normal pulses.     Heart sounds: Normal heart sounds.  Pulmonary:     Effort: Pulmonary effort is normal.     Breath sounds: Normal breath sounds. No wheezing, rhonchi or rales.  Chest:     Breasts: Breasts are symmetrical.        Right: No inverted nipple, mass, nipple discharge, skin change or tenderness.        Left: No inverted nipple, mass, nipple discharge, skin change or tenderness.  Lymphadenopathy:     Head:     Right side of head: No submental, submandibular, tonsillar, preauricular, posterior auricular or occipital adenopathy.     Left side of head: No submental, submandibular, tonsillar, preauricular, posterior auricular or occipital adenopathy.     Cervical: No cervical adenopathy.     Right cervical: No superficial, deep or posterior cervical adenopathy.    Left cervical: No superficial, deep or posterior cervical adenopathy.  Skin:    General: Skin is warm and dry.  Neurological:     Mental Status: She is alert.     Cranial Nerves: No cranial nerve deficit.     Sensory: No sensory deficit.     Deep Tendon Reflexes:     Reflex Scores:      Bicep reflexes are 2+ on the right side and 2+ on the left side.      Patellar reflexes are 2+ on the right side and 2+ on the left side.    Comments: Grip equal and strong bilateral upper extremities. Gait strong and steady. Able to perform rapid alternating movement without difficulty.   Psychiatric:        Speech: Speech normal.        Behavior: Behavior normal.        Thought Content: Thought content normal.        Assessment & Plan:   Problem List Items Addressed This Visit      Cardiovascular and Mediastinum   HTN  (hypertension)    Improved, continue current regimen      Relevant Medications   amLODipine (NORVASC) 2.5 MG tablet   Migraine    Improved since starting amlodipine.  Numbness around the lips of nonspecific etiology.  ?related to headache and/or another etiology.  If labs are unrevealing including B12 folate, TSH, patient decided that she would be agreeable to see neurology.  Will await labs.       Relevant Medications   amLODipine (NORVASC) 2.5 MG tablet     Other   Routine physical examination -  Primary    Clinical breast exam performed today.  Deferred pelvic exam in the absence of complaints also patient follows with OB/GYN.  She declines referral for genetics consult in the setting of her breast cancer family risk.  I did advise her to consider earlier colonoscopy at  the age of 28 years old, patient verbalized understanding of this      Relevant Orders   B12 and Folate Panel   TSH   CBC with Differential/Platelet   Comprehensive metabolic panel   Hemoglobin A1c   Lipid panel   VITAMIN D 25 Hydroxy (Vit-D Deficiency, Fractures)   Fatigue    Unchanged.  Will evaluate for metabolic reason with labs today. If labs are unrevealing, we discussed neurology consult fin particular for concern of sleep disorder.      Contraceptive surveillance    Discussed at length remote history of what appears to migraine with aura.  We discussed a CDC criteria for contraceptive use in which a migraine with aura, combined pill patch or ring would pose unacceptable health risk in regard to thrombosis.  Advised patient that I did not know of a  lower dose vaginal ring used for birth control.  Advised her to consider something such as Depo-Provera, Implanon or even copper IUD.  She is considering Depo-Provera.  She will let me know what she decides and is aware of her current risk.       Other Visit Diagnoses    Elevated blood pressure reading       Relevant Medications   amLODipine (NORVASC) 2.5 MG  tablet       I am having Alwyn Ren maintain her etonogestrel-ethinyl estradiol and amLODipine.   Meds ordered this encounter  Medications  . amLODipine (NORVASC) 2.5 MG tablet    Sig: Take 1 tablet (2.5 mg total) by mouth daily.    Dispense:  90 tablet    Refill:  1    Order Specific Question:   Supervising Provider    Answer:   Crecencio Mc [[id number]]    Return precautions given.   Risks, benefits, and alternatives of the medications and treatment plan prescribed today were discussed, and patient expressed understanding.   Education regarding symptom management and diagnosis given to patient on AVS.   Continue to follow with Burnard Hawthorne, FNP for routine health maintenance.   Alwyn Ren and I agreed with plan.   Mable Paris, FNP

## 2019-06-25 NOTE — Patient Instructions (Signed)
You should plan on earlier colonoscopy at age of 15 due to your family history  Please let me know if you would consider genetics consult based on your family history of breast cancer.   If labs normal, we need to consult neurology for your symptoms, sleep, numbness  Stay safe!   Health Maintenance, Female Adopting a healthy lifestyle and getting preventive care are important in promoting health and wellness. Ask your health care provider about:  The right schedule for you to have regular tests and exams.  Things you can do on your own to prevent diseases and keep yourself healthy. What should I know about diet, weight, and exercise? Eat a healthy diet   Eat a diet that includes plenty of vegetables, fruits, low-fat dairy products, and lean protein.  Do not eat a lot of foods that are high in solid fats, added sugars, or sodium. Maintain a healthy weight Body mass index (BMI) is used to identify weight problems. It estimates body fat based on height and weight. Your health care provider can help determine your BMI and help you achieve or maintain a healthy weight. Get regular exercise Get regular exercise. This is one of the most important things you can do for your health. Most adults should:  Exercise for at least 150 minutes each week. The exercise should increase your heart rate and make you sweat (moderate-intensity exercise).  Do strengthening exercises at least twice a week. This is in addition to the moderate-intensity exercise.  Spend less time sitting. Even light physical activity can be beneficial. Watch cholesterol and blood lipids Have your blood tested for lipids and cholesterol at 30 years of age, then have this test every 5 years. Have your cholesterol levels checked more often if:  Your lipid or cholesterol levels are high.  You are older than 30 years of age.  You are at high risk for heart disease. What should I know about cancer screening? Depending on  your health history and family history, you may need to have cancer screening at various ages. This may include screening for:  Breast cancer.  Cervical cancer.  Colorectal cancer.  Skin cancer.  Lung cancer. What should I know about heart disease, diabetes, and high blood pressure? Blood pressure and heart disease  High blood pressure causes heart disease and increases the risk of stroke. This is more likely to develop in people who have high blood pressure readings, are of African descent, or are overweight.  Have your blood pressure checked: ? Every 3-5 years if you are 30-11 years of age. ? Every year if you are 11 years old or older. Diabetes Have regular diabetes screenings. This checks your fasting blood sugar level. Have the screening done:  Once every three years after age 65 if you are at a normal weight and have a low risk for diabetes.  More often and at a younger age if you are overweight or have a high risk for diabetes. What should I know about preventing infection? Hepatitis B If you have a higher risk for hepatitis B, you should be screened for this virus. Talk with your health care provider to find out if you are at risk for hepatitis B infection. Hepatitis C Testing is recommended for:  Everyone born from [birth year] through 1967.  Anyone with known risk factors for hepatitis C. Sexually transmitted infections (STIs)  Get screened for STIs, including gonorrhea and chlamydia, if: ? You are sexually active and are younger than 30 years of  age. ? You are older than 30 years of age and your health care provider tells you that you are at risk for this type of infection. ? Your sexual activity has changed since you were last screened, and you are at increased risk for chlamydia or gonorrhea. Ask your health care provider if you are at risk.  Ask your health care provider about whether you are at high risk for HIV. Your health care provider may recommend a prescription  medicine to help prevent HIV infection. If you choose to take medicine to prevent HIV, you should first get tested for HIV. You should then be tested every 3 months for as long as you are taking the medicine. Pregnancy  If you are about to stop having your period (premenopausal) and you may become pregnant, seek counseling before you get pregnant.  Take 400 to 800 micrograms (mcg) of folic acid every day if you become pregnant.  Ask for birth control (contraception) if you want to prevent pregnancy. Osteoporosis and menopause Osteoporosis is a disease in which the bones lose minerals and strength with aging. This can result in bone fractures. If you are 30 years old or older, or if you are at risk for osteoporosis and fractures, ask your health care provider if you should:  Be screened for bone loss.  Take a calcium or vitamin D supplement to lower your risk of fractures.  Be given hormone replacement therapy (HRT) to treat symptoms of menopause. Follow these instructions at home: Lifestyle  Do not use any products that contain nicotine or tobacco, such as cigarettes, e-cigarettes, and chewing tobacco. If you need help quitting, ask your health care provider.  Do not use street drugs.  Do not share needles.  Ask your health care provider for help if you need support or information about quitting drugs. Alcohol use  Do not drink alcohol if: ? Your health care provider tells you not to drink. ? You are pregnant, may be pregnant, or are planning to become pregnant.  If you drink alcohol: ? Limit how much you use to 0-1 drink a day. ? Limit intake if you are breastfeeding.  Be aware of how much alcohol is in your drink. In the [place] one drink equals one 12 oz bottle of beer (355 mL), one 5 oz glass of wine (148 mL), or one 1 oz glass of hard liquor (44 mL). General instructions  Schedule regular health, dental, and eye exams.  Stay current with your vaccines.  Tell your health  care provider if: ? You often feel depressed. ? You have ever been abused or do not feel safe at home. Summary  Adopting a healthy lifestyle and getting preventive care are important in promoting health and wellness.  Follow your health care provider's instructions about healthy diet, exercising, and getting tested or screened for diseases.  Follow your health care provider's instructions on monitoring your cholesterol and blood pressure. This information is not intended to replace advice given to you by your health care provider. Make sure you discuss any questions you have with your health care provider. Document Released: 06/05/2011 Document Revised: 11/13/2018 Document Reviewed: 11/13/2018 Elsevier Patient Education  2020 [employer]

## 2019-06-25 NOTE — Assessment & Plan Note (Signed)
Improved, continue current regimen

## 2019-06-25 NOTE — Assessment & Plan Note (Signed)
Discussed at length remote history of what appears to migraine with aura.  We discussed a CDC criteria for contraceptive use in which a migraine with aura, combined pill patch or ring would pose unacceptable health risk in regard to thrombosis.  Advised patient that I did not know of a  lower dose vaginal ring used for birth control.  Advised her to consider something such as Depo-Provera, Implanon or even copper IUD.  She is considering Depo-Provera.  She will let me know what she decides and is aware of her current risk.

## 2019-06-25 NOTE — Assessment & Plan Note (Addendum)
Improved since starting amlodipine.  Numbness around the lips of nonspecific etiology.  ?related to headache and/or another etiology.  If labs are unrevealing including B12 folate, TSH, patient decided that she would be agreeable to see neurology.  Will await labs.

## 2019-06-25 NOTE — Assessment & Plan Note (Signed)
Unchanged.  Will evaluate for metabolic reason with labs today. If labs are unrevealing, we discussed neurology consult fin particular for concern of sleep disorder.

## 2019-06-25 NOTE — Assessment & Plan Note (Signed)
Clinical breast exam performed today.  Deferred pelvic exam in the absence of complaints also patient follows with OB/GYN.  She declines referral for genetics consult in the setting of her breast cancer family risk.  I did advise her to consider earlier colonoscopy at  the age of 28 years old, patient verbalized understanding of this

## 2019-06-26 LAB — CBC WITH DIFFERENTIAL/PLATELET
Basophils Absolute: 0 10*3/uL (ref 0.0–0.2)
Basos: 0 %
EOS (ABSOLUTE): 0.1 10*3/uL (ref 0.0–0.4)
Eos: 1 %
Hematocrit: 38.2 % (ref 34.0–46.6)
Hemoglobin: 12.2 g/dL (ref 11.1–15.9)
Immature Grans (Abs): 0 10*3/uL (ref 0.0–0.1)
Immature Granulocytes: 0 %
Lymphocytes Absolute: 4.2 10*3/uL — ABNORMAL HIGH (ref 0.7–3.1)
Lymphs: 38 %
MCH: 23.1 pg — ABNORMAL LOW (ref 26.6–33.0)
MCHC: 31.9 g/dL (ref 31.5–35.7)
MCV: 72 fL — ABNORMAL LOW (ref 79–97)
Monocytes Absolute: 1 10*3/uL — ABNORMAL HIGH (ref 0.1–0.9)
Monocytes: 9 %
Neutrophils Absolute: 5.8 10*3/uL (ref 1.4–7.0)
Neutrophils: 52 %
Platelets: 386 10*3/uL (ref 150–450)
RBC: 5.29 x10E6/uL — ABNORMAL HIGH (ref 3.77–5.28)
RDW: 15.3 % (ref 11.7–15.4)
WBC: 11.1 10*3/uL — ABNORMAL HIGH (ref 3.4–10.8)

## 2019-06-26 LAB — HEMOGLOBIN A1C
Est. average glucose Bld gHb Est-mCnc: 97 mg/dL
Hgb A1c MFr Bld: 5 % (ref 4.8–5.6)

## 2019-06-26 LAB — COMPREHENSIVE METABOLIC PANEL
ALT: 15 IU/L (ref 0–32)
AST: 15 IU/L (ref 0–40)
Albumin/Globulin Ratio: 1.4 (ref 1.2–2.2)
Albumin: 4.3 g/dL (ref 3.9–5.0)
Alkaline Phosphatase: 51 IU/L (ref 39–117)
BUN/Creatinine Ratio: 11 (ref 9–23)
BUN: 11 mg/dL (ref 6–20)
Bilirubin Total: 0.3 mg/dL (ref 0.0–1.2)
CO2: 23 mmol/L (ref 20–29)
Calcium: 9.2 mg/dL (ref 8.7–10.2)
Chloride: 103 mmol/L (ref 96–106)
Creatinine, Ser: 0.98 mg/dL (ref 0.57–1.00)
GFR calc Af Amer: 90 mL/min/{1.73_m2} (ref >59)
GFR calc non Af Amer: 78 mL/min/{1.73_m2} (ref >59)
Globulin, Total: 3 g/dL (ref 1.5–4.5)
Glucose: 74 mg/dL (ref 65–99)
Potassium: 4.9 mmol/L (ref 3.5–5.2)
Sodium: 141 mmol/L (ref 134–144)
Total Protein: 7.3 g/dL (ref 6.0–8.5)

## 2019-06-26 LAB — LIPID PANEL
Chol/HDL Ratio: 2.6 ratio (ref 0.0–4.4)
Cholesterol, Total: 156 mg/dL (ref 100–199)
HDL: 59 mg/dL (ref >39)
LDL Calculated: 68 mg/dL (ref 0–99)
Triglycerides: 146 mg/dL (ref 0–149)
VLDL Cholesterol Cal: 29 mg/dL (ref 5–40)

## 2019-06-26 LAB — B12 AND FOLATE PANEL
Folate: 14.4 ng/mL (ref >3.0)
Vitamin B-12: 491 pg/mL (ref 232–1245)

## 2019-06-26 LAB — TSH: TSH: 4.33 u[IU]/mL (ref 0.450–4.500)

## 2019-06-26 LAB — VITAMIN D 25 HYDROXY (VIT D DEFICIENCY, FRACTURES): Vit D, 25-Hydroxy: 38.4 ng/mL (ref 30.0–100.0)

## 2019-06-27 ENCOUNTER — Other Ambulatory Visit: Payer: Self-pay | Admitting: Obstetrics & Gynecology

## 2019-06-27 DIAGNOSIS — Z3049 Encounter for surveillance of other contraceptives: Secondary | ICD-10-CM

## 2019-06-30 ENCOUNTER — Other Ambulatory Visit: Payer: Self-pay | Admitting: Family

## 2019-06-30 DIAGNOSIS — R899 Unspecified abnormal finding in specimens from other organs, systems and tissues: Secondary | ICD-10-CM

## 2019-06-30 DIAGNOSIS — Z3049 Encounter for surveillance of other contraceptives: Secondary | ICD-10-CM

## 2019-07-04 ENCOUNTER — Other Ambulatory Visit: Payer: Self-pay

## 2019-07-07 ENCOUNTER — Inpatient Hospital Stay: Payer: Managed Care, Other (non HMO)

## 2019-07-07 ENCOUNTER — Other Ambulatory Visit: Payer: Self-pay

## 2019-07-07 ENCOUNTER — Encounter: Payer: Self-pay | Admitting: Oncology

## 2019-07-07 ENCOUNTER — Inpatient Hospital Stay: Payer: Managed Care, Other (non HMO) | Attending: Oncology | Admitting: Oncology

## 2019-07-07 VITALS — BP 125/83 | HR 74 | Temp 97.8°F | Wt 175.4 lb

## 2019-07-07 DIAGNOSIS — R718 Other abnormality of red blood cells: Secondary | ICD-10-CM

## 2019-07-07 DIAGNOSIS — D7589 Other specified diseases of blood and blood-forming organs: Secondary | ICD-10-CM | POA: Insufficient documentation

## 2019-07-07 DIAGNOSIS — D509 Iron deficiency anemia, unspecified: Secondary | ICD-10-CM | POA: Diagnosis not present

## 2019-07-07 DIAGNOSIS — D72829 Elevated white blood cell count, unspecified: Secondary | ICD-10-CM | POA: Diagnosis not present

## 2019-07-07 DIAGNOSIS — Z803 Family history of malignant neoplasm of breast: Secondary | ICD-10-CM | POA: Diagnosis not present

## 2019-07-07 DIAGNOSIS — Z809 Family history of malignant neoplasm, unspecified: Secondary | ICD-10-CM

## 2019-07-07 DIAGNOSIS — Z800 Family history of malignant neoplasm of digestive organs: Secondary | ICD-10-CM | POA: Insufficient documentation

## 2019-07-07 DIAGNOSIS — D563 Thalassemia minor: Secondary | ICD-10-CM | POA: Diagnosis not present

## 2019-07-07 LAB — CBC WITH DIFFERENTIAL/PLATELET
Abs Immature Granulocytes: 0.03 10*3/uL (ref 0.00–0.07)
Basophils Absolute: 0 10*3/uL (ref 0.0–0.1)
Basophils Relative: 0 %
Eosinophils Absolute: 0 10*3/uL (ref 0.0–0.5)
Eosinophils Relative: 0 %
HCT: 37.4 % (ref 36.0–46.0)
Hemoglobin: 11.8 g/dL — ABNORMAL LOW (ref 12.0–15.0)
Immature Granulocytes: 0 %
Lymphocytes Relative: 37 %
Lymphs Abs: 3.8 10*3/uL (ref 0.7–4.0)
MCH: 22.8 pg — ABNORMAL LOW (ref 26.0–34.0)
MCHC: 31.6 g/dL (ref 30.0–36.0)
MCV: 72.3 fL — ABNORMAL LOW (ref 80.0–100.0)
Monocytes Absolute: 0.9 10*3/uL (ref 0.1–1.0)
Monocytes Relative: 8 %
Neutro Abs: 5.5 10*3/uL (ref 1.7–7.7)
Neutrophils Relative %: 55 %
Platelets: 334 10*3/uL (ref 150–400)
RBC: 5.17 MIL/uL — ABNORMAL HIGH (ref 3.87–5.11)
RDW: 14.8 % (ref 11.5–15.5)
WBC: 10.2 10*3/uL (ref 4.0–10.5)
nRBC: 0 % (ref 0.0–0.2)

## 2019-07-07 LAB — RETIC PANEL
Immature Retic Fract: 12.7 % (ref 2.3–15.9)
RBC.: 5.17 MIL/uL — ABNORMAL HIGH (ref 3.87–5.11)
Retic Count, Absolute: 103.9 10*3/uL (ref 19.0–186.0)
Retic Ct Pct: 2 % (ref 0.4–3.1)
Reticulocyte Hemoglobin: 25.4 pg — ABNORMAL LOW (ref >27.9)

## 2019-07-07 LAB — TECHNOLOGIST SMEAR REVIEW

## 2019-07-07 LAB — IRON AND TIBC
Iron: 100 ug/dL (ref 28–170)
Saturation Ratios: 22 % (ref 10.4–31.8)
TIBC: 459 ug/dL — ABNORMAL HIGH (ref 250–450)
UIBC: 359 ug/dL

## 2019-07-07 LAB — FERRITIN: Ferritin: 13 ng/mL (ref 11–307)

## 2019-07-08 LAB — HEPATITIS C ANTIBODY: HCV Ab: <0.1 s/co ratio (ref 0.0–0.9)

## 2019-07-08 LAB — HEMOGLOBINOPATHY EVALUATION
Hgb A2 Quant: 5 % — ABNORMAL HIGH (ref 1.8–3.2)
Hgb A: 93.1 % — ABNORMAL LOW (ref 96.4–98.8)
Hgb C: 0 %
Hgb F Quant: 1.9 % (ref 0.0–2.0)
Hgb S Quant: 0 %
Hgb Variant: 0 %

## 2019-07-08 NOTE — Progress Notes (Signed)
Hematology/Oncology Consult note [facility] Telephone:([phone] [phone]   Patient Care Team: Burnard Hawthorne, FNP as PCP - General (Family Medicine)  REFERRING PROVIDER: Burnard Hawthorne, FNP  CHIEF COMPLAINTS/REASON FOR VISIT:  Evaluation of abnormal CBC/laboratory results.  HISTORY OF PRESENTING ILLNESS:   Kim Reeves is a  28 y.o.  female with PMH listed below was seen in consultation at the request of  Burnard Hawthorne, FNP  for evaluation of abnormal CBC/laboratory result Patient had blood work done at [facility] [facility] [employer] office. 06/25/2019, CBC showed WBC 11.1, hemoglobin 12.2, platelet count 386,000.  Differential showed increased lymphocyte, monocyte Reviewed patient's previous lab results. Leukocytosis is chronic since 2016 ranging around 11-11.6. She is chronically microcytic.  Hemoglobin runs at low normal 12.  Patient reports feeling well.  She has no new complaints.  Denies any fever, chills, chest pain, abdominal pain, lower extremity edema.  Good energy level.  Denies any frequent infection.  No unintentional weight loss or night sweating. She has family history of sickle cell disease.  Never had any pain attack. Review of Systems  Constitutional: Negative for appetite change, chills, fatigue and fever.  HENT:   Negative for hearing loss and voice change.   Eyes: Negative for eye problems.  Respiratory: Negative for chest tightness and cough.   Cardiovascular: Negative for chest pain.  Gastrointestinal: Negative for abdominal distention, abdominal pain and blood in stool.  Endocrine: Negative for hot flashes.  Genitourinary: Negative for difficulty urinating and frequency.   Musculoskeletal: Negative for arthralgias.  Skin: Negative for itching and rash.  Neurological: Negative for extremity weakness.  Hematological: Negative for adenopathy.  Psychiatric/Behavioral: Negative for confusion.    MEDICAL HISTORY:  Past  Medical History:  Diagnosis Date   Anemia    GAD (generalized anxiety disorder)    Hypertension    Major depressive disorder, single episode, severe without psychotic features ([place]) 01/25/2016    SURGICAL HISTORY: Past Surgical History:  Procedure Laterality Date   NO PAST SURGERIES      SOCIAL HISTORY: Social History   Socioeconomic History   Marital status: Single    Spouse name: Not on file   Number of children: 0   Years of education: Not on file   Highest education level: Not on file  Occupational History   Not on file  Social Needs   Financial resource strain: Not on file   Food insecurity    Worry: Not on file    Inability: Not on file   Transportation needs    Medical: Not on file    Non-medical: Not on file  Tobacco Use   Smoking status: Never Smoker   Smokeless tobacco: Never Used  Substance and Sexual Activity   Alcohol use: Not Currently    Comment: socially   Drug use: Yes    Types: Other-see comments    Comment: Etables    Sexual activity: Yes    Birth control/protection: Inserts  Lifestyle   Physical activity    Days per week: Not on file    Minutes per session: Not on file   Stress: Not on file  Relationships   Social connections    Talks on phone: Not on file    Gets together: Not on file    Attends religious service: Not on file    Active member of club or organization: Not on file    Attends meetings of clubs or organizations: Not on file    Relationship status: Not  on file   Intimate partner violence    Fear of current or ex partner: Not on file    Emotionally abused: Not on file    Physically abused: Not on file    Forced sexual activity: Not on file  Other Topics Concern   Not on file  Social History Narrative   Originally [place]      Wants [facility] [employer]      Works as Air cabin crewcytologist      Recent break up 10/2016       FAMILY HISTORY: Family History  Problem Relation Age  of Onset   Colon cancer Maternal Grandmother 60   Breast cancer Maternal Grandmother 3970   Breast cancer Paternal Aunt 3140   Colon cancer Maternal Aunt 52   Pancreatic cancer Maternal Aunt     ALLERGIES:  has No Known Allergies.  MEDICATIONS:  Current Outpatient Medications  Medication Sig Dispense Refill   amLODipine (NORVASC) 2.5 MG tablet Take 1 tablet (2.5 mg total) by mouth daily. 90 tablet 1   etonogestrel (NEXPLANON) 68 MG IMPL implant      No current facility-administered medications for this visit.      PHYSICAL EXAMINATION: ECOG PERFORMANCE STATUS: 0 - Asymptomatic Vitals:   07/07/19 1514  BP: 125/83  Pulse: 74  Temp: 97.8 F (36.6 C)   Filed Weights   07/07/19 1514  Weight: 175 lb 6 oz (79.5 kg)    Physical Exam Constitutional:      General: She is not in acute distress. HENT:     Head: Normocephalic and atraumatic.  Eyes:     General: No scleral icterus.    Pupils: Pupils are equal, round, and reactive to light.  Neck:     Musculoskeletal: Normal range of motion and neck supple.  Cardiovascular:     Rate and Rhythm: Normal rate and regular rhythm.     Heart sounds: Normal heart sounds.  Pulmonary:     Effort: Pulmonary effort is normal. No respiratory distress.     Breath sounds: No wheezing.  Abdominal:     General: Bowel sounds are normal. There is no distension.     Palpations: Abdomen is soft.     Tenderness: There is no abdominal tenderness.     Comments: Questionable hepatosplenomegaly.  Musculoskeletal: Normal range of motion.        General: No deformity.  Skin:    General: Skin is warm and dry.     Findings: No erythema or rash.  Neurological:     Mental Status: She is alert and oriented to person, place, and time.     Cranial Nerves: No cranial nerve deficit.     Coordination: Coordination normal.  Psychiatric:        Behavior: Behavior normal.        Thought Content: Thought content normal.     LABORATORY DATA:  I have  reviewed the data as listed Lab Results  Component Value Date   WBC 10.2 07/07/2019   HGB 11.8 (L) 07/07/2019   HCT 37.4 07/07/2019   MCV 72.3 (L) 07/07/2019   PLT 334 07/07/2019   Recent Labs    06/25/19 1609  NA 141  K 4.9  CL 103  CO2 23  GLUCOSE 74  BUN 11  CREATININE 0.98  CALCIUM 9.2  GFRNONAA 78  GFRAA 90  PROT 7.3  ALBUMIN 4.3  AST 15  ALT 15  ALKPHOS 51  BILITOT 0.3  Iron/TIBC/Ferritin/ %Sat    Component Value Date/Time   IRON 100 07/07/2019 1547   IRON 98 02/05/2017 1500   TIBC 459 (H) 07/07/2019 1547   TIBC 387 02/05/2017 1500   FERRITIN 13 07/07/2019 1547   IRONPCTSAT 22 07/07/2019 1547   IRONPCTSAT 25 02/05/2017 1500      RADIOGRAPHIC STUDIES: I have personally reviewed the radiological images as listed and agreed with the findings in the report. No results found.    ASSESSMENT & PLAN:  1. Leukocytosis, unspecified type   2. Microcytic anemia   3. Family history of cancer    Labs are reviewed and discussed with patient. She has chronic macrocytosis without anemia.  Possible underlying hemoglobinopathy.  Or underlying iron deficiency. Chronic leukocytosis, etiology can be due to chronic inflammation, infection, or underlying bone marrow problems. Recommend to obtain work-up including CBC, smear, hepatitis C, reticulocyte panel, iron and TIBC, ferritin, flow cytometry, ANA, hemoglobinopathy evaluation,  Physical examination positive for borderline enlarged hepatosplenomegaly.  I will obtain ultrasound abdomen.  #Family history of colon cancer and breast cancer, and pancreatic cancer.  Recommend genetic testing.  Patient prefers to think about it and update me if she wants to proceed with genetic counseling. Orders Placed This Encounter  Procedures   US Abdomen Complete    Standing Status:   Future    Standing Expiration Date:   07/06/2020    Order Specific Question:   Reason for Exam (SYMPTOM  OR DIAGNOSIS REQUIRED)    Answer:    leukocytosis, neutrophilia, splenomegaly on exam    Order Specific Question:   Preferred imaging location?    Answer:   [facility]   CBC with Differential/Platelet    Standing Status:   Future    Number of Occurrences:   1    Standing Expiration Date:   07/06/2020   Hepatitis C antibody    Standing Status:   Future    Number of Occurrences:   1    Standing Expiration Date:   07/06/2020   Retic Panel    Standing Status:   Future    Number of Occurrences:   1    Standing Expiration Date:   07/06/2020   Iron and TIBC    Standing Status:   Future    Number of Occurrences:   1    Standing Expiration Date:   07/06/2020   Ferritin    Standing Status:   Future    Number of Occurrences:   1    Standing Expiration Date:   07/06/2020   Technologist smear review    Standing Status:   Future    Number of Occurrences:   1    Standing Expiration Date:   07/06/2020   Flow cytometry panel-leukemia/lymphoma work-up    Standing Status:   Future    Number of Occurrences:   1    Standing Expiration Date:   07/06/2020   ANA, IFA (with reflex)    Standing Status:   Future    Number of Occurrences:   1    Standing Expiration Date:   07/06/2020   Hemoglobinopathy evaluation    Standing Status:   Future    Number of Occurrences:   1    Standing Expiration Date:   07/06/2020    All questions were answered. The patient knows to call the clinic with any problems questions or concerns.  cc Allegra GranaArnett, Margaret G, FNP    Return of visit: 2 weeks to discuss results. Thank you  for this kind referral and the opportunity to participate in the care of this patient. A copy of today's note is routed to referring provider  Total face to face encounter time for this patient visit was 45  min. >50% of the time was  spent in counseling and coordination of care.    Rickard PatienceZhou Channel Papandrea, MD, PhD Hematology Oncology [facility] 07/08/2019

## 2019-07-09 ENCOUNTER — Encounter: Payer: Self-pay | Admitting: Neurology

## 2019-07-09 ENCOUNTER — Other Ambulatory Visit: Payer: Self-pay

## 2019-07-09 ENCOUNTER — Ambulatory Visit: Payer: Managed Care, Other (non HMO) | Admitting: Neurology

## 2019-07-09 VITALS — BP 143/93 | HR 64 | Ht 67.0 in | Wt 176.0 lb

## 2019-07-09 DIAGNOSIS — Z820 Family history of epilepsy and other diseases of the nervous system: Secondary | ICD-10-CM | POA: Diagnosis not present

## 2019-07-09 DIAGNOSIS — R519 Headache, unspecified: Secondary | ICD-10-CM

## 2019-07-09 DIAGNOSIS — R51 Headache: Secondary | ICD-10-CM

## 2019-07-09 DIAGNOSIS — R400 Somnolence: Secondary | ICD-10-CM

## 2019-07-09 DIAGNOSIS — E663 Overweight: Secondary | ICD-10-CM

## 2019-07-09 LAB — ANTINUCLEAR ANTIBODIES, IFA: ANA Ab, IFA: NEGATIVE
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
## 2019-07-09 NOTE — Patient Instructions (Signed)
Thank you for choosing [facility] for your sleep related care! It was nice to meet you today! I appreciate that you entrust me with your sleep related healthcare concerns. I hope, I was able to address at least some of your concerns today, and that I can help you feel reassured and also get better.    Here is what we discussed today and what we came up with as our plan for you:    We will look into your severe sleepiness with a nighttime sleep study, followed by a daytime nap study. In preparation for sleep study testing: You should keep a steady sleep schedule, Do not overindulge in caffeine, you should not be on any new anxiety or antidepressant medication. Do not take any narcotic pain medication.   Please do no drive when sleepy!   Our sleep lab administrative assistant will call you to schedule your sleep study. If you don't hear back from her by about 2 weeks from now, please feel free to call her at [phone]. You can leave a message with your phone number and concerns, if you get the voicemail box. She will call back as soon as possible.

## 2019-07-10 LAB — COMP PANEL: LEUKEMIA/LYMPHOMA

## 2019-07-11 ENCOUNTER — Encounter: Payer: Self-pay | Admitting: Family

## 2019-07-16 ENCOUNTER — Other Ambulatory Visit: Payer: Self-pay

## 2019-07-16 ENCOUNTER — Ambulatory Visit
Admission: RE | Admit: 2019-07-16 | Discharge: 2019-07-16 | Disposition: A | Discharge status: Home / Self Care | Payer: Managed Care, Other (non HMO) | Source: Ambulatory Visit | Attending: Oncology | Admitting: Oncology

## 2019-07-16 DIAGNOSIS — D72829 Elevated white blood cell count, unspecified: Secondary | ICD-10-CM | POA: Diagnosis not present

## 2019-07-16 DIAGNOSIS — D509 Iron deficiency anemia, unspecified: Secondary | ICD-10-CM | POA: Diagnosis present

## 2019-07-21 ENCOUNTER — Encounter: Payer: Self-pay | Admitting: Oncology

## 2019-07-21 ENCOUNTER — Other Ambulatory Visit: Payer: Self-pay

## 2019-07-21 ENCOUNTER — Inpatient Hospital Stay: Payer: Managed Care, Other (non HMO) | Admitting: Oncology

## 2019-07-21 VITALS — BP 141/92 | HR 75 | Temp 98.1°F | Wt 176.2 lb

## 2019-07-21 DIAGNOSIS — D72829 Elevated white blood cell count, unspecified: Secondary | ICD-10-CM | POA: Diagnosis not present

## 2019-07-21 DIAGNOSIS — D563 Thalassemia minor: Secondary | ICD-10-CM | POA: Diagnosis not present

## 2019-07-21 DIAGNOSIS — D509 Iron deficiency anemia, unspecified: Secondary | ICD-10-CM

## 2019-07-21 NOTE — Progress Notes (Signed)
Hematology/Oncology Consult note [facility] Telephone:([phone] [phone]   Patient Care Team: Burnard Hawthorne, FNP as PCP - General (Family Medicine)  REFERRING PROVIDER: Burnard Hawthorne, FNP  CHIEF COMPLAINTS/REASON FOR VISIT:  Evaluation of abnormal CBC/laboratory results.  HISTORY OF PRESENTING ILLNESS:   Kim Reeves is a  28 y.o.  female with PMH listed below was seen in consultation at the request of  Burnard Hawthorne, FNP  for evaluation of abnormal CBC/laboratory result Patient had blood work done at [facility] [facility] office. 06/25/2019, CBC showed WBC 11.1, hemoglobin 12.2, platelet count 386,000.  Differential showed increased lymphocyte, monocyte Reviewed patient's previous lab results. Leukocytosis is chronic since 2016 ranging around 11-11.6. She is chronically microcytic.  Hemoglobin runs at low normal 12.  Patient reports feeling well.  She has no new complaints.  Denies any fever, chills, chest pain, abdominal pain, lower extremity edema.  Good energy level.  Denies any frequent infection.  No unintentional weight loss or night sweating. She has family history of sickle cell disease.  Never had any pain attack.  INTERVAL HISTORY Kim Reeves is a 28 y.o. female who has above history reviewed by me today presents for follow up visit for management of microcytic anemia.  Problems and complaints are listed below: She had blood work done during the interval. No new complaints. Feeling well today.   Review of Systems  Constitutional: Negative for appetite change, chills, fatigue and fever.  HENT:   Negative for hearing loss and voice change.   Eyes: Negative for eye problems.  Respiratory: Negative for chest tightness and cough.   Cardiovascular: Negative for chest pain.  Gastrointestinal: Negative for abdominal distention, abdominal pain and blood in stool.  Endocrine: Negative for hot flashes.  Genitourinary: Negative for  difficulty urinating and frequency.   Musculoskeletal: Negative for arthralgias.  Skin: Negative for itching and rash.  Neurological: Negative for extremity weakness.  Hematological: Negative for adenopathy.  Psychiatric/Behavioral: Negative for confusion.    MEDICAL HISTORY:  Past Medical History:  Diagnosis Date  . Anemia   . GAD (generalized anxiety disorder)   . Hypertension   . Major depressive disorder, single episode, severe without psychotic features ([place]) 01/25/2016    SURGICAL HISTORY: Past Surgical History:  Procedure Laterality Date  . NO PAST SURGERIES      SOCIAL HISTORY: Social History   Socioeconomic History  . Marital status: Single    Spouse name: Not on file  . Number of children: 0  . Years of education: Not on file  . Highest education level: Not on file  Occupational History  . Not on file  Social Needs  . Financial resource strain: Not on file  . Food insecurity    Worry: Not on file    Inability: Not on file  . Transportation needs    Medical: Not on file    Non-medical: Not on file  Tobacco Use  . Smoking status: Never Smoker  . Smokeless tobacco: Never Used  Substance and Sexual Activity  . Alcohol use: Not Currently    Comment: socially  . Drug use: Yes    Types: Other-see comments    Comment: Etables   . Sexual activity: Yes    Birth control/protection: Inserts  Lifestyle  . Physical activity    Days per week: Not on file    Minutes per session: Not on file  . Stress: Not on file  Relationships  . Social Herbalist on  phone: Not on file    Gets together: Not on file    Attends religious service: Not on file    Active member of club or organization: Not on file    Attends meetings of clubs or organizations: Not on file    Relationship status: Not on file  . Intimate partner violence    Fear of current or ex partner: Not on file    Emotionally abused: Not on file    Physically abused: Not on file    Forced sexual  activity: Not on file  Other Topics Concern  . Not on file  Social History Narrative   [place]      Wants to apply to [employer] for [employer]      Works as Air cabin crewcytologist      Recent break up 10/2016       FAMILY HISTORY: Family History  Problem Relation Age of Onset  . Colon cancer Maternal Grandmother 60  . Breast cancer Maternal Grandmother [id number]  . Breast cancer Paternal Aunt 40  . Colon cancer Maternal Aunt 52  . Pancreatic cancer Maternal Aunt     ALLERGIES:  has No Known Allergies.  MEDICATIONS:  Current Outpatient Medications  Medication Sig Dispense Refill  . amLODipine (NORVASC) 2.5 MG tablet Take 1 tablet (2.5 mg total) by mouth daily. 90 tablet 1  . etonogestrel-ethinyl estradiol (NUVARING) 0.12-0.015 MG/24HR vaginal ring Place 1 each vaginally every 28 (twenty-eight) days. Insert vaginally and leave in place for 3 consecutive weeks, then remove for 1 week.     No current facility-administered medications for this visit.      PHYSICAL EXAMINATION: ECOG PERFORMANCE STATUS: 0 - Asymptomatic Vitals:   07/21/19 1439  BP: (!) 141/92  Pulse: 75  Temp: 98.1 F (36.7 C)   Filed Weights   07/21/19 1439  Weight: 176 lb 3 oz (79.9 kg)    Physical Exam Constitutional:      General: She is not in acute distress. HENT:     Head: Normocephalic and atraumatic.  Eyes:     General: No scleral icterus.    Pupils: Pupils are equal, round, and reactive to light.  Neck:     Musculoskeletal: Normal range of motion and neck supple.  Cardiovascular:     Rate and Rhythm: Normal rate and regular rhythm.     Heart sounds: Normal heart sounds.  Pulmonary:     Effort: Pulmonary effort is normal. No respiratory distress.     Breath sounds: No wheezing.  Abdominal:     General: Bowel sounds are normal. There is no distension.     Palpations: Abdomen is soft.     Tenderness: There is no abdominal tenderness.  Musculoskeletal: Normal range of motion.         General: No deformity.  Skin:    General: Skin is warm and dry.     Findings: No erythema or rash.  Neurological:     Mental Status: She is alert and oriented to person, place, and time.     Cranial Nerves: No cranial nerve deficit.     Coordination: Coordination normal.  Psychiatric:        Behavior: Behavior normal.        Thought Content: Thought content normal.     LABORATORY DATA:  I have reviewed the data as listed Lab Results  Component Value Date   WBC 10.2 07/07/2019   HGB 11.8 (L) 07/07/2019   HCT 37.4 07/07/2019  MCV 72.3 (L) 07/07/2019   PLT 334 07/07/2019   Recent Labs    06/25/19 1609  NA 141  K 4.9  CL 103  CO2 23  GLUCOSE 74  BUN 11  CREATININE 0.98  CALCIUM 9.2  GFRNONAA 78  GFRAA 90  PROT 7.3  ALBUMIN 4.3  AST 15  ALT 15  ALKPHOS 51  BILITOT 0.3   Iron/TIBC/Ferritin/ %Sat    Component Value Date/Time   IRON 100 07/07/2019 1547   IRON 98 02/05/2017 1500   TIBC 459 (H) 07/07/2019 1547   TIBC 387 02/05/2017 1500   FERRITIN 13 07/07/2019 1547   IRONPCTSAT 22 07/07/2019 1547   IRONPCTSAT 25 02/05/2017 1500      RADIOGRAPHIC STUDIES: I have personally reviewed the radiological images as listed and agreed with the findings in the report. [place] Abdomen Complete  Result Date: 07/16/2019 CLINICAL DATA:  Leukocytosis.  Splenomegaly on exam. EXAM: ABDOMEN ULTRASOUND COMPLETE COMPARISON:  CT abdomen pelvis dated Apr 19, 2015. FINDINGS: Gallbladder: No gallstones or wall thickening visualized. No sonographic Murphy sign noted by sonographer. Common bile duct: Diameter: 2 mm, normal. Liver: No focal lesion identified. Within normal limits in parenchymal echogenicity. Portal vein is patent on color Doppler imaging with normal direction of blood flow towards the liver. IVC: No abnormality visualized. Pancreas: Visualized portion unremarkable. Spleen: Size and appearance within normal limits. Right Kidney: Length: 9.4 cm. Echogenicity within normal limits.  No mass or hydronephrosis visualized. Left Kidney: Length: 9.9 cm. Echogenicity within normal limits. No mass or hydronephrosis visualized. Abdominal aorta: No aneurysm visualized. Other findings: None. IMPRESSION: 1. Normal abdominal ultrasound.  No splenomegaly. Electronically Signed   By: Obie DredgeWilliam T Derry M.D.   On: 07/16/2019 15:08      ASSESSMENT & PLAN:  1. Thalassemia minor   2. Microcytic anemia    Labs are reviewed and discussed with patient.  Diagnosis of Beta thalassemia minor discussed. Individuals usually are asymptomatic or mild microcytic anemia without need of intervention. Recommend patient to have prenatal genetic counseling when plan conceiving.   Iron panel showed elevated TIBC with borderline ferritin. She may also have a component of iron deficiency.   #Family history of colon cancer and breast cancer, and pancreatic cancer.  Recommend genetic testing.  Patient prefers to think about it and update me if she wants to proceed with genetic counseling.   Orders Placed This Encounter  Procedures  . CBC with Differential/Platelet    Standing Status:   Future    Standing Expiration Date:   07/20/2020    All questions were answered. The patient knows to call the clinic with any problems questions or concerns.  cc Allegra GranaArnett, Margaret G, FNP    Return of visit:  Follow up in 6 months.  Thank you for this kind referral and the opportunity to participate in the care of this patient. A copy of today's note is routed to referring provider  Total face to face encounter time for this patient visit was 45  min. >50% of the time was  spent in counseling and coordination of care.    Rickard PatienceZhou Taeko Schaffer, MD, PhD Hematology Oncology [facility] 07/21/2019

## 2019-07-21 NOTE — Progress Notes (Signed)
Patient here today for follow up on blood work from 2 weeks ago.

## 2019-07-23 ENCOUNTER — Encounter: Payer: Self-pay | Admitting: Family

## 2019-07-23 DIAGNOSIS — D569 Thalassemia, unspecified: Secondary | ICD-10-CM | POA: Insufficient documentation

## 2019-08-08 ENCOUNTER — Ambulatory Visit: Payer: Managed Care, Other (non HMO) | Admitting: Obstetrics & Gynecology

## 2019-08-12 ENCOUNTER — Telehealth: Payer: Self-pay

## 2019-08-12 NOTE — Telephone Encounter (Signed)
Pt was called on 8/26 to schedule NPSG and MSLT sleep studies. Pt was not ready to schedule at that time and wanted to call back at a later date. Called pt again on 9/3, no answer and LVM. Waiting for patient to return my calls.

## 2019-10-15 ENCOUNTER — Encounter: Payer: Self-pay | Admitting: Obstetrics & Gynecology

## 2019-10-15 ENCOUNTER — Other Ambulatory Visit: Payer: Self-pay

## 2019-10-15 ENCOUNTER — Ambulatory Visit (INDEPENDENT_AMBULATORY_CARE_PROVIDER_SITE_OTHER): Payer: Managed Care, Other (non HMO) | Admitting: Obstetrics & Gynecology

## 2019-10-15 VITALS — BP 128/80 | Ht 67.5 in | Wt 179.0 lb

## 2019-10-15 DIAGNOSIS — N870 Mild cervical dysplasia: Secondary | ICD-10-CM

## 2019-10-15 DIAGNOSIS — Z01419 Encounter for gynecological examination (general) (routine) without abnormal findings: Secondary | ICD-10-CM | POA: Diagnosis not present

## 2019-10-15 NOTE — Progress Notes (Signed)
HPI:      [name] is a 28 y.o. G1P0010 who LMP was Patient's last menstrual period was 10/01/2019., she presents today for her annual examination. The patient has no complaints today. The patient is sexually active. Her last pap: approximate date 04/2019 and was abnormal: LGSIL; prior PAP ASCUS and Colpo/Bx CIN I.. The patient does perform self breast exams.  There is notable family history of breast or ovarian cancer in her family.  The patient has regular exercise: yes.  The patient denies current symptoms of depression.    GYN History: Contraception: NuvaRing vaginal inserts  PMHx: Past Medical History:  Diagnosis Date  . Anemia   . GAD (generalized anxiety disorder)   . Hypertension   . 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 NO PAST SURGERIES     Family History  Problem Relation Age of Onset  . Colon cancer Maternal Grandmother 60  . Breast cancer Maternal Grandmother 85  . Breast cancer Paternal Aunt 40  . Colon cancer Maternal Aunt 52  . Pancreatic cancer Maternal Aunt    Social History   Tobacco Use  . Smoking status: Never Smoker  . Smokeless tobacco: Never Used  Substance Use Topics  . Alcohol use: Not Currently    Comment: socially  . Drug use: Yes    Types: Other-see comments    Comment: Etables     Current Outpatient Medications:  .  amLODipine (NORVASC) 2.5 MG tablet, Take 1 tablet (2.5 mg total) by mouth daily., Disp: 90 tablet, Rfl: 1 Allergies: Patient has no known allergies.  Review of Systems  Constitutional: Negative for chills, fever and malaise/fatigue.  HENT: Negative for congestion, sinus pain and sore throat.   Eyes: Negative for blurred vision and pain.  Respiratory: Negative for cough and wheezing.   Cardiovascular: Negative for chest pain and leg swelling.  Gastrointestinal: Negative for abdominal pain, constipation,  diarrhea, heartburn, nausea and vomiting.  Genitourinary: Negative for dysuria, frequency, hematuria and urgency.  Musculoskeletal: Negative for back pain, joint pain, myalgias and neck pain.  Skin: Negative for itching and rash.  Neurological: Positive for headaches. Negative for dizziness, tremors and weakness.  Endo/Heme/Allergies: Does not bruise/bleed easily.  Psychiatric/Behavioral: Negative for depression. The patient is not nervous/anxious and does not have insomnia.     Objective: BP 128/80   Ht 5' 7.5" (1.715 m)   Wt 179 lb (81.2 kg)   LMP 10/01/2019   BMI 27.62 kg/m   Filed Weights   10/15/19 1423  Weight: 179 lb (81.2 kg)   Body mass index is 27.62 kg/m. Physical Exam Constitutional:      General: She is not in acute distress.    Appearance: She is well-developed.  Genitourinary:     Pelvic exam was performed with patient supine.     Vagina, uterus and rectum normal.     No lesions in the vagina.     No vaginal bleeding.     No cervical motion tenderness, friability, lesion or polyp.     Uterus is mobile.     Uterus is not enlarged.     No uterine mass detected.    Uterus is midaxial.     No right or left adnexal mass present.     Right adnexa not tender.     Left adnexa not tender.  HENT:     Head: Normocephalic and atraumatic. No laceration.  Right Ear: Hearing normal.     Left Ear: Hearing normal.     Mouth/Throat:     Pharynx: Uvula midline.  Eyes:     Pupils: Pupils are equal, round, and reactive to light.  Neck:     Musculoskeletal: Normal range of motion and neck supple.     Thyroid: No thyromegaly.  Cardiovascular:     Rate and Rhythm: Normal rate and regular rhythm.     Heart sounds: No murmur. No friction rub. No gallop.   Pulmonary:     Effort: Pulmonary effort is normal. No respiratory distress.     Breath sounds: Normal breath sounds. No wheezing.  Chest:     Breasts:        Right: No mass, skin change or tenderness.        Left:  No mass, skin change or tenderness.  Abdominal:     General: Bowel sounds are normal. There is no distension.     Palpations: Abdomen is soft.     Tenderness: There is no abdominal tenderness. There is no rebound.  Musculoskeletal: Normal range of motion.  Neurological:     Mental Status: She is alert and oriented to person, place, and time.     Cranial Nerves: No cranial nerve deficit.  Skin:    General: Skin is warm and dry.  Psychiatric:        Judgment: Judgment normal.  Vitals signs reviewed.     Assessment:  ANNUAL EXAM 1. Women's annual routine gynecological examination   2. CIN I (cervical intraepithelial neoplasia I)      Screening Plan:            1.  Cervical Screening-  Pap smear done today for f/u to the CIN I  2. Breast screening- Exam annually and mammogram>40 planned   3. Colonoscopy every 10 years, Hemoccult testing - after age 100  4. Labs managed by PCP  5. Counseling for contraception: Will stop Nuvaring due to auro migraines and risks associated w estrogen, and also as she will try for pregnancy this upcoming year.    F/U  Return in about 6 months (around 04/13/2020) for Follow up.  Barnett Applebaum, MD, [facility], [facility] 10/15/2019  3:20 PM

## 2019-10-15 NOTE — Patient Instructions (Signed)
Preparing for Pregnancy If you are considering becoming pregnant, make an appointment to see your regular health care provider to learn how to prepare for a safe and healthy pregnancy (preconception care). During a preconception care visit, your health care provider will:  Do a complete physical exam, including a Pap test.  Take a complete medical history.  Give you information, answer your questions, and help you resolve problems. Preconception checklist Medical history  Tell your health care provider about any current or past medical conditions. Your pregnancy or your ability to become pregnant may be affected by chronic conditions, such as diabetes, chronic hypertension, and thyroid problems.  Include your family's medical history as well as your partner's medical history.  Tell your health care provider about any history of STIs (sexually transmitted infections).These can affect your pregnancy. In some cases, they can be passed to your baby. Discuss any concerns that you have about STIs.  If indicated, discuss the benefits of genetic testing. This testing will show whether there are any genetic conditions that may be passed from you or your partner to your baby.  Tell your health care provider about: ? Any problems you have had with conception or pregnancy. ? Any medicines you take. These include vitamins, herbal supplements, and over-the-counter medicines. ? Your history of immunizations. Discuss any vaccinations that you may need. Diet  Ask your health care provider what to include in a healthy diet that has a balance of nutrients. This is especially important when you are pregnant or preparing to become pregnant.  Ask your health care provider to help you reach a healthy weight before pregnancy. ? If you are overweight, you may be at higher risk for certain complications, such as high blood pressure, diabetes, and preterm birth. ? If you are underweight, you are more likely to  have a baby who has a low birth weight. Lifestyle, work, and home  Let your health care provider know: ? About any lifestyle habits that you have, such as alcohol use, drug use, or smoking. ? About recreational activities that may put you at risk during pregnancy, such as downhill skiing and certain exercise programs. ? Tell your health care provider about any international travel, especially any travel to places with an active Zika virus outbreak. ? About harmful substances that you may be exposed to at work or at home. These include chemicals, pesticides, radiation, or even litter boxes. ? If you do not feel safe at home. Mental health  Tell your health care provider about: ? Any history of mental health conditions, including feelings of depression, sadness, or anxiety. ? Any medicines that you take for a mental health condition. These include herbs and supplements. Home instructions to prepare for pregnancy Lifestyle   Eat a balanced diet. This includes fresh fruits and vegetables, whole grains, lean meats, low-fat dairy products, healthy fats, and foods that are high in fiber. Ask to meet with a nutritionist or registered dietitian for assistance with meal planning and goals.  Get regular exercise. Try to be active for at least 30 minutes a day on most days of the week. Ask your health care provider which activities are safe during pregnancy.  Do not use any products that contain nicotine or tobacco, such as cigarettes and e-cigarettes. If you need help quitting, ask your health care provider.  Do not drink alcohol.  Do not take illegal drugs.  Maintain a healthy weight. Ask your health care provider what weight range is right for you. General   instructions  Keep an accurate record of your menstrual periods. This makes it easier for your health care provider to determine your baby's due date.  Begin taking prenatal vitamins and folic acid supplements daily as directed by your  health care provider.  Manage any chronic conditions, such as high blood pressure and diabetes, as told by your health care provider. This is important. How do I know that I am pregnant? You may be pregnant if you have been sexually active and you miss your period. Symptoms of early pregnancy include:  Mild cramping.  Very light vaginal bleeding (spotting).  Feeling unusually tired.  Nausea and vomiting (morning sickness). If you have any of these symptoms and you suspect that you might be pregnant, you can take a home pregnancy test. These tests check for a hormone in your urine (human chorionic gonadotropin, or hCG). A woman's body begins to make this hormone during early pregnancy. These tests are very accurate. Wait until at least the first day after you miss your period to take one. If the test shows that you are pregnant (you get a positive result), call your health care provider to make an appointment for prenatal care. What should I do if I become pregnant?      Make an appointment with your health care provider as soon as you suspect you are pregnant.  Do not use any products that contain nicotine, such as cigarettes, chewing tobacco, and e-cigarettes. If you need help quitting, ask your health care provider.  Do not drink alcoholic beverages. Alcohol is related to a number of birth defects.  Avoid toxic odors and chemicals.  You may continue to have sexual intercourse if it does not cause pain or other problems, such as vaginal bleeding. This information is not intended to replace advice given to you by your health care provider. Make sure you discuss any questions you have with your health care provider. Document Released: 11/02/2008 Document Revised: 11/22/2017 Document Reviewed: 06/11/2016 Elsevier Patient Education  2020 Elsevier Inc.        

## 2019-10-16 ENCOUNTER — Telehealth: Payer: Self-pay

## 2019-10-16 NOTE — Telephone Encounter (Signed)
Pt came yesterday for annual. She states she needs to have blood work done so she can have a complete STD check. Can you put the orders in and ill let her know.

## 2019-10-17 ENCOUNTER — Other Ambulatory Visit: Payer: Self-pay

## 2019-10-17 ENCOUNTER — Other Ambulatory Visit: Payer: Self-pay | Admitting: Obstetrics & Gynecology

## 2019-10-17 ENCOUNTER — Other Ambulatory Visit: Payer: Managed Care, Other (non HMO)

## 2019-10-17 DIAGNOSIS — Z202 Contact with and (suspected) exposure to infections with a predominantly sexual mode of transmission: Secondary | ICD-10-CM

## 2019-10-17 NOTE — Telephone Encounter (Signed)
Patient aware of schedule appointment for 10/17/19 with LAB

## 2019-10-17 NOTE — Telephone Encounter (Signed)
Orders placed; arrange time to come in for blood work

## 2019-10-17 NOTE — Telephone Encounter (Signed)
Can you call pt and add her to the lab schedule for STD labs

## 2019-10-18 LAB — HEP, RPR, HIV PANEL
HIV Screen 4th Generation wRfx: NONREACTIVE
Hepatitis B Surface Ag: NEGATIVE
RPR Ser Ql: NONREACTIVE

## 2019-10-22 LAB — PAP IG (IMAGE GUIDED)

## 2019-11-19 ENCOUNTER — Encounter: Payer: Self-pay | Admitting: Obstetrics and Gynecology

## 2019-12-26 ENCOUNTER — Ambulatory Visit: Payer: Managed Care, Other (non HMO) | Attending: Internal Medicine

## 2019-12-26 DIAGNOSIS — Z20822 Contact with and (suspected) exposure to covid-19: Secondary | ICD-10-CM

## 2019-12-27 LAB — NOVEL CORONAVIRUS, NAA: SARS-CoV-2, NAA: NOT DETECTED

## 2020-01-07 ENCOUNTER — Ambulatory Visit: Payer: Managed Care, Other (non HMO) | Attending: Internal Medicine

## 2020-01-07 DIAGNOSIS — Z20822 Contact with and (suspected) exposure to covid-19: Secondary | ICD-10-CM

## 2020-01-08 LAB — NOVEL CORONAVIRUS, NAA: SARS-CoV-2, NAA: NOT DETECTED

## 2020-01-16 ENCOUNTER — Other Ambulatory Visit: Payer: Self-pay

## 2020-01-16 ENCOUNTER — Inpatient Hospital Stay: Payer: Managed Care, Other (non HMO) | Attending: Oncology

## 2020-01-16 DIAGNOSIS — D563 Thalassemia minor: Secondary | ICD-10-CM | POA: Diagnosis present

## 2020-01-16 DIAGNOSIS — D509 Iron deficiency anemia, unspecified: Secondary | ICD-10-CM | POA: Diagnosis not present

## 2020-01-16 LAB — CBC WITH DIFFERENTIAL/PLATELET
Abs Immature Granulocytes: 0.02 10*3/uL (ref 0.00–0.07)
Basophils Absolute: 0 10*3/uL (ref 0.0–0.1)
Basophils Relative: 0 %
Eosinophils Absolute: 0 10*3/uL (ref 0.0–0.5)
Eosinophils Relative: 0 %
HCT: 40.4 % (ref 36.0–46.0)
Hemoglobin: 12.4 g/dL (ref 12.0–15.0)
Immature Granulocytes: 0 %
Lymphocytes Relative: 34 %
Lymphs Abs: 3.4 10*3/uL (ref 0.7–4.0)
MCH: 22.5 pg — ABNORMAL LOW (ref 26.0–34.0)
MCHC: 30.7 g/dL (ref 30.0–36.0)
MCV: 73.3 fL — ABNORMAL LOW (ref 80.0–100.0)
Monocytes Absolute: 0.8 10*3/uL (ref 0.1–1.0)
Monocytes Relative: 8 %
Neutro Abs: 5.8 10*3/uL (ref 1.7–7.7)
Neutrophils Relative %: 58 %
Platelets: 371 10*3/uL (ref 150–400)
RBC: 5.51 MIL/uL — ABNORMAL HIGH (ref 3.87–5.11)
RDW: 14.7 % (ref 11.5–15.5)
WBC: 10.2 10*3/uL (ref 4.0–10.5)
nRBC: 0 % (ref 0.0–0.2)

## 2020-01-16 LAB — IRON AND TIBC
Iron: 121 ug/dL (ref 28–170)
Saturation Ratios: 26 % (ref 10.4–31.8)
TIBC: 470 ug/dL — ABNORMAL HIGH (ref 250–450)
UIBC: 349 ug/dL

## 2020-01-16 LAB — FERRITIN: Ferritin: 12 ng/mL (ref 11–307)

## 2020-01-19 ENCOUNTER — Encounter: Payer: Self-pay | Admitting: Oncology

## 2020-01-19 ENCOUNTER — Other Ambulatory Visit: Payer: Managed Care, Other (non HMO)

## 2020-01-19 ENCOUNTER — Inpatient Hospital Stay (HOSPITAL_BASED_OUTPATIENT_CLINIC_OR_DEPARTMENT_OTHER): Payer: Managed Care, Other (non HMO) | Admitting: Oncology

## 2020-01-19 DIAGNOSIS — D509 Iron deficiency anemia, unspecified: Secondary | ICD-10-CM

## 2020-01-19 DIAGNOSIS — D563 Thalassemia minor: Secondary | ICD-10-CM | POA: Diagnosis not present

## 2020-01-19 NOTE — Progress Notes (Signed)
Patient verified using two identifiers for virtual visit via telephone today.  Patient does not offer any problems today.  

## 2020-01-19 NOTE — Progress Notes (Signed)
HEMATOLOGY-ONCOLOGY TeleHEALTH VISIT PROGRESS NOTE  I connected with Kim Reeves on 01/19/20 at  2:15 PM EST by video enabled telemedicine visit and verified that I am speaking with the correct person using two identifiers. I discussed the limitations, risks, security and privacy concerns of performing an evaluation and management service by telemedicine and the availability of in-person appointments. I also discussed with the patient that there may be a patient responsible charge related to this service. The patient expressed understanding and agreed to proceed.   Other persons participating in the visit and their role in the encounter:  None  Patient's location: in her car- not driving Provider's location: office Chief Complaint: follow up for anemia.    INTERVAL HISTORY Kim Reeves is a 29 y.o. female who has above history reviewed by me today presents for follow up visit for management of anemia, thalassemia minor.  Problems and complaints are listed below:  Patient reports chronically feeling sluggish..  Otherwise no new complaints.  Review of Systems  Constitutional: Negative for appetite change, chills and fever.       Feels sluggish  HENT:   Negative for hearing loss and voice change.   Eyes: Negative for eye problems.  Respiratory: Negative for chest tightness and cough.   Cardiovascular: Negative for chest pain.  Gastrointestinal: Negative for abdominal distention, abdominal pain and blood in stool.  Endocrine: Negative for hot flashes.  Genitourinary: Negative for difficulty urinating and frequency.   Musculoskeletal: Negative for arthralgias.  Skin: Negative for itching and rash.  Neurological: Negative for extremity weakness.  Hematological: Negative for adenopathy.  Psychiatric/Behavioral: Negative for confusion.    Past Medical History:  Diagnosis Date  . Anemia   . Family history of breast cancer    12/20 cancer genetic tesing letter sent  . Family history of  pancreatic cancer   . GAD (generalized anxiety disorder)   . Hypertension   . 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 NO PAST SURGERIES      Family History  Problem Relation Age of Onset  . Colon cancer Maternal Grandmother 60  . Breast cancer Maternal Grandmother 8  . Breast cancer Paternal Aunt 40  . Colon cancer Maternal Aunt 52  . Pancreatic cancer Maternal Aunt   . Breast cancer Cousin 63    Social History   Socioeconomic History  . Marital status: Single    Spouse name: Not on file  . Number of children: 0  . Years of education: Not on file  . Highest education level: Not on file  Occupational History  . Not on file  Tobacco Use  . Smoking status: Never Smoker  . Smokeless tobacco: Never Used  Substance and Sexual Activity  . Alcohol use: Not Currently    Comment: socially  . Drug use: Yes    Types: Other-see comments    Comment: Etables   . Sexual activity: Yes    Birth control/protection: Inserts  Other Topics Concern  . Not on file  Social History Narrative   Originally from [place]      Wants to apply to [employer] for [employer] studies      Works as Air cabin crew      Recent break up 10/2016      Social Determinants of Corporate investment banker Strain:   . Difficulty of Paying Living Expenses: Not on file  Food Insecurity:   . Worried About  Running Out of Food in the Last Year: Not on file  . Ran Out of Food in the Last Year: Not on file  Transportation Needs:   . Lack of Transportation (Medical): Not on file  . Lack of Transportation (Non-Medical): Not on file  Physical Activity:   . Days of Exercise per Week: Not on file  . Minutes of Exercise per Session: Not on file  Stress:   . Feeling of Stress : Not on file  Social Connections:   . Frequency of Communication with Friends and Family: Not on file  . Frequency of Social Gatherings  with Friends and Family: Not on file  . Attends Religious Services: Not on file  . Active Member of Clubs or Organizations: Not on file  . Attends Archivist Meetings: Not on file  . Marital Status: Not on file  Intimate Partner Violence:   . Fear of Current or Ex-Partner: Not on file  . Emotionally Abused: Not on file  . Physically Abused: Not on file  . Sexually Abused: Not on file    Current Outpatient Medications on File Prior to Visit  Medication Sig Dispense Refill  . amLODipine (NORVASC) 2.5 MG tablet Take 1 tablet (2.5 mg total) by mouth daily. 90 tablet 1  . etonogestrel-ethinyl estradiol (NUVARING) 0.12-0.015 MG/24HR vaginal ring Place 1 each vaginally every 28 (twenty-eight) days. Insert vaginally and leave in place for 3 consecutive weeks, then remove for 1 week.     No current facility-administered medications on file prior to visit.    No Known Allergies     Observations/Objective: Today's Vitals   01/19/20 1349  PainSc: 0-No pain   There is no height or weight on file to calculate BMI.  Physical Exam  Constitutional: No distress.  Neurological: She is alert.    CBC    Component Value Date/Time   WBC 10.2 01/16/2020 1405   RBC 5.51 (H) 01/16/2020 1405   HGB 12.4 01/16/2020 1405   HGB 12.2 06/25/2019 1609   HCT 40.4 01/16/2020 1405   HCT 38.2 06/25/2019 1609   PLT 371 01/16/2020 1405   PLT 386 06/25/2019 1609   MCV 73.3 (L) 01/16/2020 1405   MCV 72 (L) 06/25/2019 1609   MCH 22.5 (L) 01/16/2020 1405   MCHC 30.7 01/16/2020 1405   RDW 14.7 01/16/2020 1405   RDW 15.3 06/25/2019 1609   LYMPHSABS 3.4 01/16/2020 1405   LYMPHSABS 4.2 (H) 06/25/2019 1609   MONOABS 0.8 01/16/2020 1405   EOSABS 0.0 01/16/2020 1405   EOSABS 0.1 06/25/2019 1609   BASOSABS 0.0 01/16/2020 1405   BASOSABS 0.0 06/25/2019 1609    CMP     Component Value Date/Time   NA 141 06/25/2019 1609   K 4.9 06/25/2019 1609   CL 103 06/25/2019 1609   CO2 23 06/25/2019 1609    GLUCOSE 74 06/25/2019 1609   GLUCOSE 85 04/19/2015 0843   BUN 11 06/25/2019 1609   CREATININE 0.98 06/25/2019 1609   CALCIUM 9.2 06/25/2019 1609   PROT 7.3 06/25/2019 1609   ALBUMIN 4.3 06/25/2019 1609   AST 15 06/25/2019 1609   ALT 15 06/25/2019 1609   ALKPHOS 51 06/25/2019 1609   BILITOT 0.3 06/25/2019 1609   GFRNONAA 78 06/25/2019 1609   GFRAA 90 06/25/2019 1609     Assessment and Plan: 1. Thalassemia minor   2. Microcytic anemia     Thalassemia minor,Labs reviewed and discussed with patient. Hemoglobin has improved since last visit.  Completely normalized.  Iron panel was reviewed.  She has normal iron saturation 26 and a ferritin level of 12 Discussed with patient that thalassemia minor usually asymptomatic with no anemia or mild anemia.  Iron panels are adequate. She may take a multivitamin.  No need to specific take any iron supplementation. I will discharge her back to follow-up with primary care provider. If she develops any new symptoms or concerns in the future related to hematology oncology, I be happy to see her again.  Patient agrees with the plan.  Follow Up Instructions:  No need for follow-up.  I discussed the assessment and treatment plan with the patient. The patient was provided an opportunity to ask questions and all were answered. The patient agreed with the plan and demonstrated an understanding of the instructions.  The patient was advised to call back or seek an in-person evaluation if the symptoms worsen or if the condition fails to improve as anticipated.    Rickard Patience, MD 01/19/2020 10:12 PM

## 2020-04-14 ENCOUNTER — Ambulatory Visit: Payer: Managed Care, Other (non HMO) | Admitting: Obstetrics & Gynecology

## 2020-04-14 ENCOUNTER — Encounter: Payer: Self-pay | Admitting: Obstetrics & Gynecology

## 2020-04-14 ENCOUNTER — Ambulatory Visit (INDEPENDENT_AMBULATORY_CARE_PROVIDER_SITE_OTHER): Payer: Managed Care, Other (non HMO) | Admitting: Obstetrics & Gynecology

## 2020-04-14 ENCOUNTER — Other Ambulatory Visit: Payer: Self-pay

## 2020-04-14 VITALS — BP 120/80 | Ht 68.0 in | Wt 180.0 lb

## 2020-04-14 DIAGNOSIS — Z3044 Encounter for surveillance of vaginal ring hormonal contraceptive device: Secondary | ICD-10-CM | POA: Diagnosis not present

## 2020-04-14 DIAGNOSIS — N870 Mild cervical dysplasia: Secondary | ICD-10-CM | POA: Diagnosis not present

## 2020-04-14 MED ORDER — NORETHINDRONE 0.35 MG PO TABS: 1.0000 | ORAL_TABLET | Freq: Every day | ORAL | 2 refills | Status: DC

## 2020-04-14 NOTE — Progress Notes (Signed)
HPI:  Patient is a 29 y.o. G1P0010 presenting for follow up evaluation of abnormal PAP smear in the past.  Her last PAP was 6 months ago and approximate date 10/2019 and was abnormal: ASCUS. She has had a prior colposcopy. Prior biopsies (if done) were CIN I, one year ago.  She also reports that she has continued to use Nuvaring for birth control.  Not yet ready for pregnancy.  Dating for one year, and he want pregnancy. Pt has occas headache, known to have ocular migraines.  PMHx: She  has a past medical history of Anemia, Family history of breast cancer, Family history of pancreatic cancer, GAD (generalized anxiety disorder), Hypertension, and Major depressive disorder, single episode, severe without psychotic features ([place]) (01/25/2016). Also,  has a past surgical history that includes No past surgeries., family history includes Breast cancer (age of onset: 69) in her paternal aunt; Breast cancer (age of onset: 40) in her cousin; Breast cancer (age of onset: 28) in her maternal grandmother; Colon cancer (age of onset: 7) in her maternal aunt; Colon cancer (age of onset: 14) in her maternal grandmother; Pancreatic cancer in her maternal aunt.,  reports that she has never smoked. She has never used smokeless tobacco. She reports previous alcohol use. She reports current drug use. Drug: Other-see comments.  She has a current medication list which includes the following prescription(s): amlodipine and norethindrone. Also, has No Known Allergies.  Review of Systems  Neurological: Positive for headaches.  All other systems reviewed and are negative.   Objective: BP 120/80   Ht 5\' 8"  (1.727 m)   Wt 180 lb (81.6 kg)   LMP 03/19/2020   BMI 27.37 kg/m  Filed Weights   04/14/20 0838  Weight: 180 lb (81.6 kg)   Body mass index is 27.37 kg/m.  Physical examination Physical Exam Constitutional:      General: She is not in acute distress.    Appearance: She is well-developed.  Genitourinary:       Pelvic exam was performed with patient supine.     Vagina and uterus normal.     No vaginal erythema or bleeding.     No cervical motion tenderness, discharge, polyp or nabothian cyst.     Uterus is mobile.     Uterus is not enlarged.     No uterine mass detected.    Uterus is midaxial.     No right or left adnexal mass present.     Right adnexa not tender.     Left adnexa not tender.  HENT:     Head: Normocephalic and atraumatic.     Nose: Nose normal.  Abdominal:     General: There is no distension.     Palpations: Abdomen is soft.     Tenderness: There is no abdominal tenderness.  Musculoskeletal:        General: Normal range of motion.  Neurological:     Mental Status: She is alert and oriented to person, place, and time.     Cranial Nerves: No cranial nerve deficit.  Skin:    General: Skin is warm and dry.  Psychiatric:        Attention and Perception: Attention normal.        Mood and Affect: Mood and affect normal.        Speech: Speech normal.        Behavior: Behavior normal.        Thought Content: Thought content normal.  Judgment: Judgment normal.     ASSESSMENT:  History of Cervical Dysplasia   ICD-10-CM   1. CIN I (cervical intraepithelial neoplasia I)  N87.0 Pap IG (Image Guided)  2. Encounter for surveillance of vaginal ring hormonal contraceptive device  Z30.44    Plan:  1.  I discussed the grading system of pap smears and HPV high risk viral types.   2. Follow up PAP 6 months, vs intervention if high grade dysplasia identified. 3. Treatment of persistantly abnormal PAP smears and cervical dysplasia, even mild, is discussed w pt today in detail, as well as the pros and cons of Cryo and LEEP procedures. Will consider and discuss after results.  4. Discussed change to progesterone only birth control based on concern for estrogen excaerbation of migraines or increased risk for stroke.  Pros and cons and effectiveness of progesterone only pills  discussed.  Rx Norethinedrone 0.35 mg daily.  Monitor effects.  A total of 20 minutes were spent face-to-face with the patient as well as preparation, review, communication, and documentation during this encounter.    Annamarie Major, MD, [facility], [facility] 04/14/2020  8:54 AM

## 2020-04-16 LAB — PAP IG (IMAGE GUIDED)

## 2020-05-05 ENCOUNTER — Other Ambulatory Visit: Payer: Managed Care, Other (non HMO)

## 2020-05-17 ENCOUNTER — Telehealth: Payer: Self-pay | Admitting: Family

## 2020-05-17 ENCOUNTER — Encounter: Payer: Self-pay | Admitting: Family

## 2020-05-17 NOTE — Telephone Encounter (Signed)
Pt called returning call

## 2020-05-17 NOTE — Telephone Encounter (Signed)
Call pt Triage If HA is severe, certainly any cp, sob,left arm numbness, changes in vision,she would need eval at UC today. Treatment of HA is lower blood pressure.    Ensure leg swelling is equal in BOTH legs and that she has NO SOB.  If sob, unequal, she would need UC as well.   Otherwise, I would recommend STOP the amlodipine 2.5mg , and call in hctz 12.mg qhs with repeat BMP in one week. Please make an appt with me asap as well.  Ensure she is not taking daily NSAIDs or adding a lot of salt as this can contribute to elevated BP.

## 2020-05-17 NOTE — Telephone Encounter (Signed)
LMTCB

## 2020-05-18 ENCOUNTER — Other Ambulatory Visit: Payer: Self-pay | Admitting: Family

## 2020-05-18 DIAGNOSIS — I10 Essential (primary) hypertension: Secondary | ICD-10-CM

## 2020-05-18 MED ORDER — HYDROCHLOROTHIAZIDE 12.5 MG PO CAPS: 12.5000 mg | ORAL_CAPSULE | Freq: Every day | ORAL | 0 refills | Status: DC

## 2020-05-18 NOTE — Telephone Encounter (Signed)
Spoke with patient. Confirmed no acute symptoms today. BP yesterday was 180/113. Checked again last night was 145/95. Confirmed leg swelling was bilateral and equal- only to ankles. No sob. Pt has not checked BP today. I have advised her to stop amlodipine and start hctz 12.5mg  q day. Scheduled for a f/u in office with PCP on 6/23 to check BP and have labs. Patient aware of appt date/time.

## 2020-05-18 NOTE — Progress Notes (Signed)
See phone note from trisha

## 2020-05-18 NOTE — Telephone Encounter (Signed)
Bethann Berkshire at [employer], would you mind calling pt per below?

## 2020-05-24 ENCOUNTER — Other Ambulatory Visit: Payer: Self-pay

## 2020-05-26 ENCOUNTER — Ambulatory Visit (INDEPENDENT_AMBULATORY_CARE_PROVIDER_SITE_OTHER): Payer: Managed Care, Other (non HMO)

## 2020-05-26 ENCOUNTER — Other Ambulatory Visit: Payer: Self-pay

## 2020-05-26 ENCOUNTER — Encounter: Payer: Self-pay | Admitting: Family

## 2020-05-26 ENCOUNTER — Ambulatory Visit: Payer: Managed Care, Other (non HMO) | Admitting: Family

## 2020-05-26 VITALS — BP 125/92 | HR 78 | Temp 97.9°F | Ht 68.0 in | Wt 177.8 lb

## 2020-05-26 DIAGNOSIS — I10 Essential (primary) hypertension: Secondary | ICD-10-CM | POA: Diagnosis not present

## 2020-05-26 DIAGNOSIS — M542 Cervicalgia: Secondary | ICD-10-CM

## 2020-05-26 DIAGNOSIS — G43109 Migraine with aura, not intractable, without status migrainosus: Secondary | ICD-10-CM | POA: Diagnosis not present

## 2020-05-26 MED ORDER — PROPRANOLOL HCL 20 MG PO TABS: 20.0000 mg | ORAL_TABLET | Freq: Two times a day (BID) | ORAL | 0 refills | Status: DC

## 2020-05-26 NOTE — Assessment & Plan Note (Signed)
Pending xray. Will discuss medication, PT at follow up.

## 2020-05-26 NOTE — Progress Notes (Signed)
Subjective:    Patient ID: Kim Reeves, female    DOB: 1991-10-26, 29 y.o.   MRN: [id number]  CC: Kim Reeves is a 29 y.o. female who presents today for follow up.   HPI: Complains of headaches x 2 weeks, unchanged.   This HA started today this morning. Describes as mild.  She states it is not the worst headache of her life. HA is always by bilateral temples.  HA doesn't awake her from sleep. If bends over, HA more noticeable. Now when she thinks about it and I asked, HA occasionally may increase while having a BM, however after HA , she has noticed it will improve.  Has thought that eating salty foods, pork and elevated BP trigger HA.  No congestion.  Chronic neck pain. No numbness in arms. Attributes to working at computer, microscope.  No caffeine use. No NSAIDs.  Hasnt tried any medication for HA.  No drug use.  Episode of dizziness with last HA 2 weeks ago. No dizzy today. No numbness of face, numbness to left arm, cp, syncope.    Last week had 'migraine' which was nauseated, photophobia which is typical for her. Never been to neurology. In the past, had aura , a 'flash in the bottom right eye.' UTD eye exam, wear glasses.  No vision changes . No recent aura.  No hearing changes, ringing in ears.  Has had migraine for 3-4 years.   HTN- improved. at home, 135/93 on the day started hctz. Subsequent BP , 132/92, 125/86, 125/97, 135/95,    BLE edema resolved after stopping amlodipine.  Started hctz 12.5mg  one week ago. Thinks amlodpine was better for blood pressure.    HISTORY:  Past Medical History:  Diagnosis Date  . Anemia   . Family history of breast cancer    12/20 cancer genetic tesing letter sent  . Family history of pancreatic cancer   . GAD (generalized anxiety disorder)   . Hypertension   . Major depressive disorder, single episode, severe without psychotic features ([place]) 01/25/2016   Past Surgical History:  Procedure Laterality Date  . NO PAST SURGERIES      Family History  Problem Relation Age of Onset  . Colon cancer Maternal Grandmother 71  . Breast cancer Maternal Grandmother 50  . Breast cancer Paternal Aunt 46  . Colon cancer Maternal Aunt 19  . Pancreatic cancer Maternal Aunt   . Breast cancer Cousin 41    Allergies: Patient has no known allergies. Current Outpatient Medications on File Prior to Visit  Medication Sig Dispense Refill  . hydrochlorothiazide (MICROZIDE) 12.5 MG capsule Take 1 capsule (12.5 mg total) by mouth daily. 90 capsule 0  . norethindrone (NORLYDA) 0.35 MG tablet Take 1 tablet by mouth daily.     No current facility-administered medications on file prior to visit.    Social History   Tobacco Use  . Smoking status: Never Smoker  . Smokeless tobacco: Never Used  Vaping Use  . Vaping Use: Never used  Substance Use Topics  . Alcohol use: Not Currently    Comment: socially  . Drug use: Yes    Types: Other-see comments    Comment: Etables     Review of Systems  Constitutional: Negative for chills and fever.  Eyes: Negative for visual disturbance.  Respiratory: Negative for cough and shortness of breath.   Cardiovascular: Negative for chest pain, palpitations and leg swelling.  Gastrointestinal: Negative for nausea and vomiting.  Musculoskeletal: Positive for neck pain. Negative  for neck stiffness.  Neurological: Positive for headaches. Negative for dizziness (resolved), weakness and numbness.      Objective:    BP (!) 125/92   Pulse 78   Temp 97.9 F (36.6 C) (Temporal)   Ht 5\' 8"  (1.727 m)   Wt 177 lb 12.8 oz (80.6 kg)   LMP 05/14/2020   SpO2 96%   BMI 27.03 kg/m  BP Readings from Last 3 Encounters:  05/26/20 (!) 125/92  04/14/20 120/80  10/15/19 128/80   Wt Readings from Last 3 Encounters:  05/26/20 177 lb 12.8 oz (80.6 kg)  04/14/20 180 lb (81.6 kg)  10/15/19 179 lb (81.2 kg)    Physical Exam Vitals reviewed.  Constitutional:      Appearance: She is well-developed.  HENT:      Mouth/Throat:     Pharynx: Uvula midline.  Eyes:     Conjunctiva/sclera: Conjunctivae normal.     Pupils: Pupils are equal, round, and reactive to light.     Comments: Fundus normal bilaterally.   Cardiovascular:     Rate and Rhythm: Normal rate and regular rhythm.     Pulses: Normal pulses.     Heart sounds: Normal heart sounds.  Pulmonary:     Effort: Pulmonary effort is normal.     Breath sounds: Normal breath sounds. No wheezing, rhonchi or rales.  Musculoskeletal:     Cervical back: No edema, erythema or rigidity. Muscular tenderness (over trapezius) present. No spinous process tenderness. Normal range of motion.     Right lower leg: No edema.     Left lower leg: No edema.  Skin:    General: Skin is warm and dry.  Neurological:     Mental Status: She is alert.     Cranial Nerves: No cranial nerve deficit.     Sensory: No sensory deficit.     Deep Tendon Reflexes:     Reflex Scores:      Bicep reflexes are 2+ on the right side and 2+ on the left side.      Patellar reflexes are 2+ on the right side and 2+ on the left side.    Comments: Grip equal and strong bilateral upper extremities. Gait strong and steady. Able to perform rapid alternating movement without difficulty.   Psychiatric:        Speech: Speech normal.        Behavior: Behavior normal.        Thought Content: Thought content normal.        Assessment & Plan:   Problem List Items Addressed This Visit      Cardiovascular and Mediastinum   HTN (hypertension) - Primary    Improved. Will adjunct with propranolol.       Relevant Medications   propranolol (INDERAL) 20 MG tablet   Other Relevant Orders   Basic metabolic panel   Migraine    Headache presentation appears tension in etiology, also history of migraine-like headaches.  Reassured by neurologic exam today.  Did discuss with patient that as headache increases with bending over, Valsalva and advised her to have an MRI of the brain as this may  potentially a red flag.   She politely declines at this time as she feels like her headaches have been already contributed by her blood pressure.  She would first like to see if better control blood pressure improves headache.  We also discussed posterior  neck pain is likely contributory as well.  She will trial propranolol and if  no improvement in the next couple of days and certainly any worsening features, patient will let me know. I have advised that if ANY acute changes in her presentation or symptoms, she understands to go directly to emergency room.      Relevant Medications   propranolol (INDERAL) 20 MG tablet     Other   Neck pain    Pending xray. Will discuss medication, PT at follow up.      Relevant Orders   DG Cervical Spine Complete       I am having Kim Reeves start on propranolol. I am also having her maintain her hydrochlorothiazide and norethindrone.   Meds ordered this encounter  Medications  . propranolol (INDERAL) 20 MG tablet    Sig: Take 1 tablet (20 mg total) by mouth 2 (two) times daily.    Dispense:  120 tablet    Refill:  0    Order Specific Question:   Supervising Provider    Answer:   Sherlene Shams [[id number]]    Return precautions given.   Risks, benefits, and alternatives of the medications and treatment plan prescribed today were discussed, and patient expressed understanding.   Education regarding symptom management and diagnosis given to patient on AVS.  Continue to follow with [name], FNP for routine health maintenance.   [name] and I agreed with plan.   Rennie Plowman, FNP

## 2020-05-26 NOTE — Patient Instructions (Signed)
Trial of atenolol twice daily to see if this is effective for blood pressure and also headache. As discussed at length, if your headache were to increase in severity, frequency or you develop any new signs or symptoms, please go immediately to emergency department to be evaluated.  If no improvement of headache AND blood pressure in the next 7 days, please let me know so we can proceed with further evaluation as discussed as well.

## 2020-05-26 NOTE — Assessment & Plan Note (Signed)
Headache presentation appears tension in etiology, also history of migraine-like headaches.  Reassured by neurologic exam today.  Did discuss with patient that as headache increases with bending over, Valsalva and advised her to have an MRI of the brain as this may potentially a red flag.   She politely declines at this time as she feels like her headaches have been already contributed by her blood pressure.  She would first like to see if better control blood pressure improves headache.  We also discussed posterior  neck pain is likely contributory as well.  She will trial propranolol and if no improvement in the next couple of days and certainly any worsening features, patient will let me know. I have advised that if ANY acute changes in her presentation or symptoms, she understands to go directly to emergency room.

## 2020-05-26 NOTE — Assessment & Plan Note (Signed)
Improved. Will adjunct with propranolol.

## 2020-05-27 LAB — BASIC METABOLIC PANEL
BUN/Creatinine Ratio: 14 (ref 9–23)
BUN: 12 mg/dL (ref 6–20)
CO2: 25 mmol/L (ref 20–29)
Calcium: 9.1 mg/dL (ref 8.7–10.2)
Chloride: 101 mmol/L (ref 96–106)
Creatinine, Ser: 0.85 mg/dL (ref 0.57–1.00)
GFR calc Af Amer: 106 mL/min/{1.73_m2} (ref >59)
GFR calc non Af Amer: 92 mL/min/{1.73_m2} (ref >59)
Glucose: 80 mg/dL (ref 65–99)
Potassium: 4.5 mmol/L (ref 3.5–5.2)
Sodium: 140 mmol/L (ref 134–144)

## 2020-07-05 NOTE — Progress Notes (Signed)
Kim Grana, FNP   Chief Complaint  Patient presents with  . Menstrual Problem    had 2 cyles in july, lots of cramping  . Vaginal Discharge    fishy odor, no itchiness or irritation x 3 weeks  . [employer] Employee    HPI:      Kim Reeves is a 29 y.o. G1P0010 whose LMP was Patient's last menstrual period was 06/10/2020 (exact date)., presents today for BTB on POPs, started 5/21 (was on nuvaring) due to HTN. Had 8-9 day period with dysmen early July and then bleeding again 5 days after cessation for another 5 days. Mod flow with dime sized clots, and mild dysmen. Unusual for pt. She is not sex active since 6/21  Has also had increased d/c with fishy odor, no irritation for a few wks. Hx of BV in past with menses, so did continuous dosing of nuvaring and boric acid supp with good sx control. Having sx again since off nuvaring and having bleeding with POPs. No meds to treat. No pelvic pain, fevers, urin sx.   Neg pap 04/14/20. Hx of LGSIL and ASCUS in 2020, followed by Dr. Tiburcio Pea. Repeat pap due 11/21.  Past Medical History:  Diagnosis Date  . Anemia   . Family history of breast cancer    12/20 cancer genetic tesing letter sent  . Family history of pancreatic cancer   . GAD (generalized anxiety disorder)   . Hypertension   . 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 NO PAST SURGERIES      Family History  Problem Relation Age of Onset  . Colon cancer Maternal Grandmother 60  . Breast cancer Maternal Grandmother 70  . Breast cancer Paternal Aunt 40  . Colon cancer Maternal Aunt 52  . Pancreatic cancer Maternal Aunt   . Breast cancer Cousin 44    Social History   Socioeconomic History  . Marital status: Single    Spouse name: Not on file  . Number of children: 0  . Years of education: Not on file  . Highest education level: Not on file  Occupational  History  . Not on file  Tobacco Use  . Smoking status: Never Smoker  . Smokeless tobacco: Never Used  Vaping Use  . Vaping Use: Never used  Substance and Sexual Activity  . Alcohol use: Not Currently    Comment: socially  . Drug use: Yes    Types: Other-see comments    Comment: Etables   . Sexual activity: Yes    Birth control/protection: Pill  Other Topics Concern  . Not on file  Social History Narrative   Originally from [place]      Wants to apply to [employer] for [employer] studies      Works as Air cabin crew      Recent break up 10/2016      Social Determinants of Corporate investment banker Strain:   . Difficulty of Paying Living Expenses:   Food Insecurity:   . Worried About Programme researcher, broadcasting/film/video in the Last Year:   . Barista in the Last Year:   Transportation Needs:   . Freight forwarder (Medical):   Marland Kitchen Lack of Transportation (Non-Medical):   Physical Activity:   . Days of Exercise per Week:   . Minutes of Exercise per Session:   Stress:   . Feeling  of Stress :   Social Connections:   . Frequency of Communication with Friends and Family:   . Frequency of Social Gatherings with Friends and Family:   . Attends Religious Services:   . Active Member of Clubs or Organizations:   . Attends Banker Meetings:   Marland Kitchen Marital Status:   Intimate Partner Violence:   . Fear of Current or Ex-Partner:   . Emotionally Abused:   Marland Kitchen Physically Abused:   . Sexually Abused:     Outpatient Medications Prior to Visit  Medication Sig Dispense Refill  . hydrochlorothiazide (MICROZIDE) 12.5 MG capsule Take 1 capsule (12.5 mg total) by mouth daily. 90 capsule 0  . norethindrone (NORLYDA) 0.35 MG tablet Take 1 tablet by mouth daily.    . propranolol (INDERAL) 20 MG tablet Take 1 tablet (20 mg total) by mouth 2 (two) times daily. (Patient not taking: Reported on 07/06/2020) 120 tablet 0   No facility-administered medications prior to visit.      ROS:  Review  of Systems  Constitutional: Negative for fever.  Gastrointestinal: Negative for blood in stool, constipation, diarrhea, nausea and vomiting.  Genitourinary: Positive for dyspareunia, vaginal bleeding and vaginal discharge. Negative for dysuria, flank pain, frequency, hematuria, urgency and vaginal pain.  Musculoskeletal: Negative for back pain.  Skin: Negative for rash.   BREAST: No symptoms   OBJECTIVE:   Vitals:  BP 110/80   Ht 5' 7.5" (1.715 m)   Wt 179 lb (81.2 kg)   LMP 06/10/2020 (Exact Date)   BMI 27.62 kg/m   Physical Exam Vitals reviewed.  Constitutional:      Appearance: She is well-developed.  Pulmonary:     Effort: Pulmonary effort is normal.  Genitourinary:    General: Normal vulva.     Pubic Area: No rash.      Labia:        Right: No rash, tenderness or lesion.        Left: No rash, tenderness or lesion.      Vagina: Normal. No vaginal discharge, erythema or tenderness.     Cervix: Normal.     Uterus: Normal. Not enlarged and not tender.      Adnexa: Right adnexa normal and left adnexa normal.       Right: No mass or tenderness.         Left: No mass or tenderness.    Musculoskeletal:        General: Normal range of motion.     Cervical back: Normal range of motion.  Skin:    General: Skin is warm and dry.  Neurological:     General: No focal deficit present.     Mental Status: She is alert and oriented to person, place, and time.  Psychiatric:        Mood and Affect: Mood normal.        Behavior: Behavior normal.        Thought Content: Thought content normal.        Judgment: Judgment normal.     Results: Results for orders placed or performed in visit on 07/06/20 (from the past 24 hour(s))  POCT urine pregnancy     Status: Normal   Collection Time: 07/06/20  9:38 AM  Result Value Ref Range   Preg Test, Ur Negative Negative  POCT Wet Prep with KOH     Status: Abnormal   Collection Time: 07/06/20  9:52 AM  Result Value Ref Range    Trichomonas, UA  Negative    Clue Cells Wet Prep HPF POC few    Epithelial Wet Prep HPF POC     Yeast Wet Prep HPF POC neg    Bacteria Wet Prep HPF POC     RBC Wet Prep HPF POC     WBC Wet Prep HPF POC     KOH Prep POC Positive (A) Negative     Assessment/Plan: Breakthrough bleeding on OCPs - Plan: POCT urine pregnancy. Neg UPT. Reassurance since just started POPs 5/21. If sx persist, can try slynd instead of camila. F/u prn.   BV (bacterial vaginosis) - Plan: POCT Wet Prep with KOH, metroNIDAZOLE (METROGEL) 0.75 % vaginal gel; pos sx and wet prep. Rx metrogel per pt pref. Will RF if sx recur. May need to resume boric acid supp. F/u prn.    Meds ordered this encounter  Medications  . metroNIDAZOLE (METROGEL) 0.75 % vaginal gel    Sig: Place 1 Applicatorful vaginally at bedtime for 5 days.    Dispense:  50 g    Refill:  0    Order Specific Question:   Supervising Provider    Answer:   Nadara Mustard [[id number]]      Return if symptoms worsen or fail to improve.  Breezie Micucci B. Jazmen Lindenbaum, PA-C 07/07/2020 9:34 AM

## 2020-07-06 ENCOUNTER — Ambulatory Visit (INDEPENDENT_AMBULATORY_CARE_PROVIDER_SITE_OTHER): Payer: Managed Care, Other (non HMO) | Admitting: Obstetrics and Gynecology

## 2020-07-06 ENCOUNTER — Other Ambulatory Visit: Payer: Self-pay

## 2020-07-06 ENCOUNTER — Encounter: Payer: Self-pay | Admitting: Obstetrics and Gynecology

## 2020-07-06 VITALS — BP 110/80 | Ht 67.5 in | Wt 179.0 lb

## 2020-07-06 DIAGNOSIS — N921 Excessive and frequent menstruation with irregular cycle: Secondary | ICD-10-CM | POA: Diagnosis not present

## 2020-07-06 DIAGNOSIS — N760 Acute vaginitis: Secondary | ICD-10-CM | POA: Diagnosis not present

## 2020-07-06 DIAGNOSIS — B9689 Other specified bacterial agents as the cause of diseases classified elsewhere: Secondary | ICD-10-CM

## 2020-07-06 LAB — POCT WET PREP WITH KOH
KOH Prep POC: POSITIVE — AB
Trichomonas, UA: NEGATIVE
Yeast Wet Prep HPF POC: NEGATIVE

## 2020-07-06 LAB — POCT URINE PREGNANCY: Preg Test, Ur: NEGATIVE

## 2020-07-06 MED ORDER — METRONIDAZOLE 0.75 % VA GEL: 1.0000 | Freq: Every day | VAGINAL | 0 refills | Status: AC

## 2020-07-07 ENCOUNTER — Encounter: Payer: Self-pay | Admitting: Obstetrics and Gynecology

## 2020-07-07 NOTE — Patient Instructions (Signed)
I value your feedback and entrusting us with your care. If you get a [facility] patient survey, I would appreciate you taking the time to let us know about your experience today. Thank you!  As of November 13, 2019, your lab results will be released to your MyChart immediately, before I even have a chance to see them. Please give me time to review them and contact you if there are any abnormalities. Thank you for your patience.  

## 2020-07-12 ENCOUNTER — Other Ambulatory Visit: Payer: Self-pay

## 2020-07-12 ENCOUNTER — Telehealth: Payer: Self-pay | Admitting: Family

## 2020-07-12 ENCOUNTER — Ambulatory Visit: Payer: Managed Care, Other (non HMO) | Admitting: Family

## 2020-07-12 ENCOUNTER — Encounter: Payer: Self-pay | Admitting: Family

## 2020-07-12 VITALS — BP 126/84 | HR 79 | Temp 98.8°F | Ht 67.52 in | Wt 178.6 lb

## 2020-07-12 DIAGNOSIS — M542 Cervicalgia: Secondary | ICD-10-CM

## 2020-07-12 DIAGNOSIS — G43109 Migraine with aura, not intractable, without status migrainosus: Secondary | ICD-10-CM

## 2020-07-12 DIAGNOSIS — I10 Essential (primary) hypertension: Secondary | ICD-10-CM

## 2020-07-12 MED ORDER — MELOXICAM 7.5 MG PO TABS: 7.5000 mg | ORAL_TABLET | Freq: Every day | ORAL | 1 refills | Status: DC | PRN

## 2020-07-12 NOTE — Assessment & Plan Note (Signed)
Acute on chronic. Exacerbated by forward flexion ( microscope). Given prn mobic and we agreed referral to PT for excercises, help with posture. She will let me know how she is doing

## 2020-07-12 NOTE — Assessment & Plan Note (Signed)
Resolved Will continue to monitor 

## 2020-07-12 NOTE — Assessment & Plan Note (Signed)
Stable. Continue hctz.

## 2020-07-12 NOTE — Progress Notes (Signed)
Subjective:    Patient ID: Kim Reeves, female    DOB: 21-Sep-1991, 29 y.o.   MRN: [id number]  CC: Kim Reeves is a 29 y.o. female who presents today for follow up.   HPI: Feels well today No complaints  HTN- compliant with HCTZ. At home about a month ago 136/102.  No cp, HA.   HA - resolved. No longer on propranolol.   Neck pain continues. Takes excedrin or tylenol with some relief. Looks into  microscope at work all day. Has worked 10 or more days in a row this month for over time. Neck pain is worse lately after working straight days. Neck pain improves with rest such as when she is not working at home/days off. If she doesn't routinely get up and stretch, the tips of her right fingers will become numb. Occasionally will have numbness starts in fingers and is not coming down arms. Right handed.    GYN stopped nuvaring 6-8 weeks  and started norethindrone. She has not had a migraine since.  BP 110/80  HISTORY:  Past Medical History:  Diagnosis Date  . Anemia   . Family history of breast cancer    12/20 cancer genetic tesing letter sent  . Family history of pancreatic cancer   . GAD (generalized anxiety disorder)   . Hypertension   . 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 NO PAST SURGERIES     Family History  Problem Relation Age of Onset  . Colon cancer Maternal Grandmother 60  . Breast cancer Maternal Grandmother 17  . Breast cancer Paternal Aunt 40  . Colon cancer Maternal Aunt 52  . Pancreatic cancer Maternal Aunt   . Breast cancer Cousin 41    Allergies: Patient has no known allergies. Current Outpatient Medications on File Prior to Visit  Medication Sig Dispense Refill  . hydrochlorothiazide (MICROZIDE) 12.5 MG capsule Take 1 capsule (12.5 mg total) by mouth daily. 90 capsule 0  . norethindrone (NORLYDA) 0.35 MG tablet Take 1 tablet by mouth daily.      No current facility-administered medications on file prior to visit.    Social History   Tobacco Use  . Smoking status: Never Smoker  . Smokeless tobacco: Never Used  Vaping Use  . Vaping Use: Never used  Substance Use Topics  . Alcohol use: Not Currently    Comment: socially  . Drug use: Yes    Types: Other-see comments    Comment: Etables     Review of Systems  Constitutional: Negative for chills and fever.  Eyes: Negative for visual disturbance.  Respiratory: Negative for cough.   Cardiovascular: Negative for chest pain and palpitations.  Gastrointestinal: Negative for nausea and vomiting.  Neurological: Positive for numbness (intermittent). Negative for dizziness and headaches (resolved).      Objective:    BP 126/84 (BP Location: Left Arm, Patient Position: Sitting)   Pulse 79   Temp 98.8 F (37.1 C)   Ht 5' 7.52" (1.715 m)   Wt 178 lb 9.6 oz (81 kg)   SpO2 98%   BMI 27.54 kg/m  BP Readings from Last 3 Encounters:  07/12/20 126/84  07/06/20 110/80  05/26/20 (!) 125/92   Wt Readings from Last 3 Encounters:  07/12/20 178 lb 9.6 oz (81 kg)  07/06/20 179 lb (81.2 kg)  05/26/20 177 lb 12.8 oz (80.6 kg)    Physical Exam Vitals reviewed.  Constitutional:      Appearance: She is well-developed.  Eyes:     Conjunctiva/sclera: Conjunctivae normal.  Cardiovascular:     Rate and Rhythm: Normal rate and regular rhythm.     Pulses: Normal pulses.     Heart sounds: Normal heart sounds.  Pulmonary:     Effort: Pulmonary effort is normal.     Breath sounds: Normal breath sounds. No wheezing, rhonchi or rales.  Skin:    General: Skin is warm and dry.  Neurological:     Mental Status: She is alert.  Psychiatric:        Speech: Speech normal.        Behavior: Behavior normal.        Thought Content: Thought content normal.        Assessment & Plan:   Problem List Items Addressed This Visit      Cardiovascular and Mediastinum   HTN (hypertension)     Stable. Continue hctz.      Migraine    Resolved. Will continue to monitor.       Relevant Medications   meloxicam (MOBIC) 7.5 MG tablet     Other   Neck pain - Primary    Acute on chronic. Exacerbated by forward flexion ( microscope). Given prn mobic and we agreed referral to PT for excercises, help with posture. She will let me know how she is doing      Relevant Medications   meloxicam (MOBIC) 7.5 MG tablet   Other Relevant Orders   Ambulatory referral to Physical Therapy       I have discontinued Nysia Sebastiani's propranolol. I am also having her start on meloxicam. Additionally, I am having her maintain her hydrochlorothiazide and norethindrone.   Meds ordered this encounter  Medications  . meloxicam (MOBIC) 7.5 MG tablet    Sig: Take 1 tablet (7.5 mg total) by mouth daily as needed for pain.    Dispense:  30 tablet    Refill:  1    Order Specific Question:   Supervising Provider    Answer:   Sherlene Shams [[id number]]    Return precautions given.   Risks, benefits, and alternatives of the medications and treatment plan prescribed today were discussed, and patient expressed understanding.   Education regarding symptom management and diagnosis given to patient on AVS.  Continue to follow with [name], FNP for routine health maintenance.   [name] and I agreed with plan.   Rennie Plowman, FNP

## 2020-07-12 NOTE — Patient Instructions (Signed)
Trial of mobic as needed for neck pain  A couple of points in regards to meloxicam ( Mobic) -  This medication is not intended for daily , long term use. It is a potent anti inflammatory ( NSAID), and my intention is for you take as needed for moderate to severe pain. If you find yourself using daily, please let me know.   Please takes Mobic ( meloxicam) with FOOD since it is an anti-inflammatory as it can cause a GI bleed or ulcer. If you have a history of GI bleed or ulcer, please do NOT take.  Do no take over the counter aleve, motrin, advil, goody's powder for pain as they are also NSAIDs, and they are  in the same class as Mobic  Lastly, we will need to monitor kidney function while on Mobic, and if we were to see any decline in kidney function in the future, we would have to discontinue this medication.    Referral for physical therapy Let us know if you dont hear back within a week in regards to an appointment being scheduled.

## 2020-07-12 NOTE — Telephone Encounter (Signed)
Per [facility] PLEASE ALLOW 2-4 WEEKS FOR MEDICAL REVIEW

## 2020-10-06 ENCOUNTER — Encounter: Payer: Self-pay | Admitting: Obstetrics and Gynecology

## 2020-10-06 ENCOUNTER — Ambulatory Visit (INDEPENDENT_AMBULATORY_CARE_PROVIDER_SITE_OTHER): Payer: Managed Care, Other (non HMO) | Admitting: Obstetrics and Gynecology

## 2020-10-06 ENCOUNTER — Other Ambulatory Visit: Payer: Self-pay

## 2020-10-06 VITALS — BP 110/84 | Ht 67.5 in | Wt 181.0 lb

## 2020-10-06 DIAGNOSIS — Z113 Encounter for screening for infections with a predominantly sexual mode of transmission: Secondary | ICD-10-CM | POA: Diagnosis not present

## 2020-10-06 NOTE — Progress Notes (Signed)
Kim Grana, FNP   Chief Complaint  Patient presents with  . STD testing  . [employer] Employee    HPI:      Kim Reeves is a 29 y.o. G1P0010 whose LMP was Patient's last menstrual period was 09/30/2020 (exact date)., presents today for STD testing. May have a new partner in near future and wants to be safe. Hx of gon/chlam/trich in distant past. No known exposures/sx now. Hx of BV after menses. Uses boric acid supp twice with sx relief. Neg STD testing 3/20 Was on POPs, stopped 2 wks ago due to acne/wt gain. Not interested in trying slynd right now. Plans to use condoms if sex active.    Past Medical History:  Diagnosis Date  . Anemia   . Chlamydia   . Family history of breast cancer    12/20 cancer genetic tesing letter sent  . Family history of pancreatic cancer   . GAD (generalized anxiety disorder)   . Gonorrhea   . Hypertension   . 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 NO PAST SURGERIES      Family History  Problem Relation Age of Onset  . Colon cancer Maternal Grandmother 60  . Breast cancer Maternal Grandmother 76  . Breast cancer Paternal Aunt 40  . Colon cancer Maternal Aunt 52  . Pancreatic cancer Maternal Aunt   . Breast cancer Cousin 40    Social History   Socioeconomic History  . Marital status: Single    Spouse name: Not on file  . Number of children: 0  . Years of education: Not on file  . Highest education level: Not on file  Occupational History  . Not on file  Tobacco Use  . Smoking status: Never Smoker  . Smokeless tobacco: Never Used  Vaping Use  . Vaping Use: Never used  Substance and Sexual Activity  . Alcohol use: Not Currently    Comment: socially  . Drug use: Yes    Types: Other-see comments    Comment: Etables   . Sexual activity: Yes    Birth control/protection: None, Condom  Other Topics Concern  .  Not on file  Social History Narrative   Originally from [place]      Wants to apply to [employer] for [employer] studies      Works as Air cabin crew      Recent break up 10/2016      Social Determinants of Corporate investment banker Strain:   . Difficulty of Paying Living Expenses: Not on file  Food Insecurity:   . Worried About Programme researcher, broadcasting/film/video in the Last Year: Not on file  . Ran Out of Food in the Last Year: Not on file  Transportation Needs:   . Lack of Transportation (Medical): Not on file  . Lack of Transportation (Non-Medical): Not on file  Physical Activity:   . Days of Exercise per Week: Not on file  . Minutes of Exercise per Session: Not on file  Stress:   . Feeling of Stress : Not on file  Social Connections:   . Frequency of Communication with Friends and Family: Not on file  . Frequency of Social Gatherings with Friends and Family: Not on file  . Attends Religious Services: Not on file  . Active Member of Clubs or Organizations: Not on file  . Attends Banker Meetings: Not  on file  . Marital Status: Not on file  Intimate Partner Violence:   . Fear of Current or Ex-Partner: Not on file  . Emotionally Abused: Not on file  . Physically Abused: Not on file  . Sexually Abused: Not on file    Outpatient Medications Prior to Visit  Medication Sig Dispense Refill  . hydrochlorothiazide (MICROZIDE) 12.5 MG capsule Take 1 capsule (12.5 mg total) by mouth daily. 90 capsule 0  . meloxicam (MOBIC) 7.5 MG tablet Take 1 tablet (7.5 mg total) by mouth daily as needed for pain. 30 tablet 1  . norethindrone (NORLYDA) 0.35 MG tablet Take 1 tablet by mouth daily. (Patient not taking: Reported on 10/06/2020)     No facility-administered medications prior to visit.      ROS:  Review of Systems  Constitutional: Negative for fever.  Gastrointestinal: Negative for blood in stool, constipation, diarrhea, nausea and vomiting.  Genitourinary: Negative for dyspareunia,  dysuria, flank pain, frequency, hematuria, urgency, vaginal bleeding, vaginal discharge and vaginal pain.  Musculoskeletal: Negative for back pain.  Skin: Negative for rash.  BREAST: No symptoms   OBJECTIVE:   Vitals:  BP 110/84   Ht 5' 7.5" (1.715 m)   Wt 181 lb (82.1 kg)   LMP 09/30/2020 (Exact Date)   BMI 27.93 kg/m   Physical Exam Vitals reviewed.  Constitutional:      Appearance: She is well-developed.  Pulmonary:     Effort: Pulmonary effort is normal.  Genitourinary:    General: Normal vulva.     Pubic Area: No rash.      Labia:        Right: No rash, tenderness or lesion.        Left: No rash, tenderness or lesion.      Vagina: Bleeding present. No vaginal discharge, erythema or tenderness.     Cervix: Normal.     Uterus: Normal. Not enlarged and not tender.      Adnexa: Right adnexa normal and left adnexa normal.       Right: No mass or tenderness.         Left: No mass or tenderness.    Musculoskeletal:        General: Normal range of motion.     Cervical back: Normal range of motion.  Skin:    General: Skin is warm and dry.  Neurological:     General: No focal deficit present.     Mental Status: She is alert and oriented to person, place, and time.  Psychiatric:        Mood and Affect: Mood normal.        Behavior: Behavior normal.        Thought Content: Thought content normal.        Judgment: Judgment normal.      Assessment/Plan: Screening for STD (sexually transmitted disease) - Plan: Chlamydia/Gonococcus/Trichomonas, NAA, HIV Antibody (routine testing w rflx), RPR, HSV 2 antibody, IgG, Hepatitis C antibody    Return if symptoms worsen or fail to improve.  Iran Kievit B. Domenick Quebedeaux, PA-C 10/06/2020 3:12 PM

## 2020-10-06 NOTE — Patient Instructions (Signed)
I value your feedback and entrusting us with your care. If you get a [facility] patient survey, I would appreciate you taking the time to let us know about your experience today. Thank you!  As of November 13, 2019, your lab results will be released to your MyChart immediately, before I even have a chance to see them. Please give me time to review them and contact you if there are any abnormalities. Thank you for your patience.  

## 2020-10-07 LAB — HSV 2 ANTIBODY, IGG: HSV 2 IgG, Type Spec: <0.91 index (ref 0.00–0.90)

## 2020-10-07 LAB — RPR: RPR Ser Ql: NONREACTIVE

## 2020-10-07 LAB — HEPATITIS C ANTIBODY: Hep C Virus Ab: 0.1 s/co ratio (ref 0.0–0.9)

## 2020-10-07 LAB — HIV ANTIBODY (ROUTINE TESTING W REFLEX): HIV Screen 4th Generation wRfx: NONREACTIVE

## 2020-10-09 LAB — CHLAMYDIA/GONOCOCCUS/TRICHOMONAS, NAA
Chlamydia by NAA: NEGATIVE
Gonococcus by NAA: NEGATIVE
Trich vag by NAA: NEGATIVE

## 2020-10-21 ENCOUNTER — Other Ambulatory Visit: Payer: Self-pay | Admitting: Family

## 2020-10-21 DIAGNOSIS — I10 Essential (primary) hypertension: Secondary | ICD-10-CM

## 2020-10-27 ENCOUNTER — Encounter: Payer: Self-pay | Admitting: Family

## 2020-10-27 ENCOUNTER — Ambulatory Visit: Payer: Managed Care, Other (non HMO) | Admitting: Family

## 2020-10-27 ENCOUNTER — Other Ambulatory Visit: Payer: Self-pay

## 2020-10-27 VITALS — BP 118/84 | HR 71 | Temp 98.3°F | Ht 67.5 in | Wt 180.4 lb

## 2020-10-27 DIAGNOSIS — M542 Cervicalgia: Secondary | ICD-10-CM

## 2020-10-27 DIAGNOSIS — I10 Essential (primary) hypertension: Secondary | ICD-10-CM | POA: Diagnosis not present

## 2020-10-27 DIAGNOSIS — R899 Unspecified abnormal finding in specimens from other organs, systems and tissues: Secondary | ICD-10-CM | POA: Diagnosis not present

## 2020-10-27 DIAGNOSIS — R5383 Other fatigue: Secondary | ICD-10-CM

## 2020-10-27 NOTE — Patient Instructions (Signed)
Nice to see you! Please let me know after you speak with [employer] if we can pursue sleep study again

## 2020-10-27 NOTE — Assessment & Plan Note (Signed)
Stable, continue hctz 12.5mg 

## 2020-10-27 NOTE — Assessment & Plan Note (Signed)
Chronic. Unchanged. Reiterated calling [employer] to see why sleep study declined. I would like to reorder. Patient will call to let me what Rosann Auerbach says.

## 2020-10-27 NOTE — Assessment & Plan Note (Signed)
Resolved

## 2020-10-27 NOTE — Progress Notes (Signed)
Subjective:    Patient ID: Kim Reeves, female    DOB: [birth year], 29 y.o.   MRN: [id number]  CC: Kim Reeves is a 29 y.o. female who presents today for follow up.   HPI: Feels well today No new complaints Continues to have chronic daytime sleepiness. Insurance declined covering sleep study years ago. Currently sleeping 6-8 hours per night ; sleep not restorative. No depression. No fever, unusual weight loss.   Neck pain has resolved with adjustments with chiropractoer. She goes every 2 weeks with relief. No ha, vision changes, arm weakness or numbness.   HTN- compliant with hctz.  At home BP normally around 127/81.   No cp, sob.     HISTORY:  Past Medical History:  Diagnosis Date  . Anemia   . Chlamydia   . Family history of breast cancer    12/20 cancer genetic tesing letter sent  . Family history of pancreatic cancer   . GAD (generalized anxiety disorder)   . Gonorrhea   . Hypertension   . 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 NO PAST SURGERIES     Family History  Problem Relation Age of Onset  . Colon cancer Maternal Grandmother 60  . Breast cancer Maternal Grandmother 58  . Breast cancer Paternal Aunt 40  . Colon cancer Maternal Aunt 52  . Pancreatic cancer Maternal Aunt   . Breast cancer Cousin 41    Allergies: Patient has no known allergies. Current Outpatient Medications on File Prior to Visit  Medication Sig Dispense Refill  . hydrochlorothiazide (MICROZIDE) 12.5 MG capsule TAKE 1 CAPSULE(12.5 MG) BY MOUTH DAILY 90 capsule 0  . meloxicam (MOBIC) 7.5 MG tablet Take 1 tablet (7.5 mg total) by mouth daily as needed for pain. 30 tablet 1   No current facility-administered medications on file prior to visit.    Social History   Tobacco Use  . Smoking status: Never Smoker  . Smokeless tobacco: Never Used  Vaping Use  . Vaping Use: Never used   Substance Use Topics  . Alcohol use: Not Currently    Comment: socially  . Drug use: Yes    Types: Other-see comments    Comment: Etables     Review of Systems  Constitutional: Positive for fatigue. Negative for chills and fever.  Respiratory: Negative for cough.   Cardiovascular: Negative for chest pain, palpitations and leg swelling.  Gastrointestinal: Negative for nausea and vomiting.  Musculoskeletal: Negative for neck pain (resolved) and neck stiffness.      Objective:    BP 118/84   Pulse 71   Temp 98.3 F (36.8 C)   Ht 5' 7.5" (1.715 m)   Wt 180 lb 6.4 oz (81.8 kg)   LMP 09/30/2020 (Exact Date)   SpO2 99%   BMI 27.84 kg/m  BP Readings from Last 3 Encounters:  10/27/20 118/84  10/06/20 110/84  07/12/20 126/84   Wt Readings from Last 3 Encounters:  10/27/20 180 lb 6.4 oz (81.8 kg)  10/06/20 181 lb (82.1 kg)  07/12/20 178 lb 9.6 oz (81 kg)    Physical Exam Vitals reviewed.  Constitutional:      Appearance: She is well-developed.  Eyes:     Conjunctiva/sclera: Conjunctivae normal.  Cardiovascular:     Rate and Rhythm: Normal rate and regular rhythm.     Pulses: Normal pulses.     Heart sounds: Normal heart sounds.  Pulmonary:  Effort: Pulmonary effort is normal.     Breath sounds: Normal breath sounds. No wheezing, rhonchi or rales.  Skin:    General: Skin is warm and dry.  Neurological:     Mental Status: She is alert.  Psychiatric:        Speech: Speech normal.        Behavior: Behavior normal.        Thought Content: Thought content normal.        Assessment & Plan:   Problem List Items Addressed This Visit      Cardiovascular and Mediastinum   HTN (hypertension) - Primary    Stable, continue hctz 12.5mg       Relevant Orders   Basic metabolic panel   CBC with Differential/Platelet   Hemoglobin A1c   VITAMIN D 25 Hydroxy (Vit-D Deficiency, Fractures)     Other   Fatigue    Chronic. Unchanged. Reiterated calling [employer] to see  why sleep study declined. I would like to reorder. Patient will call to let me what Rosann Auerbach says.      Neck pain    Resolved.           I have discontinued Joanny Osborne's norethindrone. I am also having her maintain her meloxicam and hydrochlorothiazide.   No orders of the defined types were placed in this encounter.   Return precautions given.   Risks, benefits, and alternatives of the medications and treatment plan prescribed today were discussed, and patient expressed understanding.   Education regarding symptom management and diagnosis given to patient on AVS.  Continue to follow with [name], FNP for routine health maintenance.   [name] and I agreed with plan.   Rennie Plowman, FNP

## 2020-10-28 LAB — BASIC METABOLIC PANEL
BUN/Creatinine Ratio: 12 (ref 9–23)
BUN: 10 mg/dL (ref 6–20)
CO2: 25 mmol/L (ref 20–29)
Calcium: 9.2 mg/dL (ref 8.7–10.2)
Chloride: 104 mmol/L (ref 96–106)
Creatinine, Ser: 0.83 mg/dL (ref 0.57–1.00)
GFR calc Af Amer: 109 mL/min/{1.73_m2} (ref >59)
GFR calc non Af Amer: 94 mL/min/{1.73_m2} (ref >59)
Glucose: 86 mg/dL (ref 65–99)
Potassium: 4.6 mmol/L (ref 3.5–5.2)
Sodium: 140 mmol/L (ref 134–144)

## 2020-10-28 LAB — CBC WITH DIFFERENTIAL/PLATELET
Basophils Absolute: 0.1 10*3/uL (ref 0.0–0.2)
Basos: 1 %
EOS (ABSOLUTE): 0.1 10*3/uL (ref 0.0–0.4)
Eos: 1 %
Hematocrit: 36 % (ref 34.0–46.6)
Hemoglobin: 11.6 g/dL (ref 11.1–15.9)
Immature Grans (Abs): 0 10*3/uL (ref 0.0–0.1)
Immature Granulocytes: 0 %
Lymphocytes Absolute: 3.6 10*3/uL — ABNORMAL HIGH (ref 0.7–3.1)
Lymphs: 38 %
MCH: 23.4 pg — ABNORMAL LOW (ref 26.6–33.0)
MCHC: 32.2 g/dL (ref 31.5–35.7)
MCV: 73 fL — ABNORMAL LOW (ref 79–97)
Monocytes Absolute: 0.9 10*3/uL (ref 0.1–0.9)
Monocytes: 9 %
Neutrophils Absolute: 5 10*3/uL (ref 1.4–7.0)
Neutrophils: 51 %
Platelets: 335 10*3/uL (ref 150–450)
RBC: 4.96 x10E6/uL (ref 3.77–5.28)
RDW: 15 % (ref 11.7–15.4)
WBC: 9.6 10*3/uL (ref 3.4–10.8)

## 2020-10-28 LAB — HEMOGLOBIN A1C
Est. average glucose Bld gHb Est-mCnc: 97 mg/dL
Hgb A1c MFr Bld: 5 % (ref 4.8–5.6)

## 2020-10-28 LAB — VITAMIN D 25 HYDROXY (VIT D DEFICIENCY, FRACTURES): Vit D, 25-Hydroxy: 26.2 ng/mL — ABNORMAL LOW (ref 30.0–100.0)

## 2020-11-05 ENCOUNTER — Other Ambulatory Visit: Payer: Self-pay

## 2020-11-05 NOTE — Addendum Note (Signed)
Addended by: [name] on: 11/05/2020 09:50 AM   Modules accepted: Orders

## 2020-12-16 NOTE — Addendum Note (Signed)
Addended by: Warden Fillers on: 12/16/2020 03:24 PM   Modules accepted: Orders

## 2020-12-20 ENCOUNTER — Other Ambulatory Visit: Payer: Managed Care, Other (non HMO)

## 2020-12-30 ENCOUNTER — Other Ambulatory Visit: Payer: Self-pay

## 2020-12-30 ENCOUNTER — Encounter: Payer: Self-pay | Admitting: Obstetrics and Gynecology

## 2020-12-30 ENCOUNTER — Ambulatory Visit (INDEPENDENT_AMBULATORY_CARE_PROVIDER_SITE_OTHER): Payer: Managed Care, Other (non HMO) | Admitting: Obstetrics and Gynecology

## 2020-12-30 VITALS — BP 126/74 | HR 77 | Temp 99.0°F | Resp 16 | Ht 68.0 in | Wt 184.4 lb

## 2020-12-30 DIAGNOSIS — Z113 Encounter for screening for infections with a predominantly sexual mode of transmission: Secondary | ICD-10-CM | POA: Diagnosis not present

## 2020-12-30 NOTE — Progress Notes (Signed)
Kim Grana, FNP   Chief Complaint  Patient presents with  . Exposure to STD    HPI:      [name] is a 30 y.o. G1P0010 whose LMP was Patient's last menstrual period was 12/24/2020., presents today for STD testing due to recent unprotected sex. No known exposures, no sx. Having typical BV sx after menses that resolve after boric acid supp for 2-3 days. Hasn't used yet due to testing today. Using condoms usually, declines other BC. Neg full STD testing 11/21; hx of gon/chlam/trich in past   Past Medical History:  Diagnosis Date  . Anemia   . Chlamydia   . Family history of breast cancer    12/20 cancer genetic tesing letter sent  . Family history of pancreatic cancer   . GAD (generalized anxiety disorder)   . Gonorrhea   . Hypertension   . 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 NO PAST SURGERIES      Family History  Problem Relation Age of Onset  . Colon cancer Maternal Grandmother 60  . Breast cancer Maternal Grandmother 70  . Breast cancer Paternal Aunt 40  . Colon cancer Maternal Aunt 52  . Pancreatic cancer Maternal Aunt   . Breast cancer Cousin 60    Social History   Socioeconomic History  . Marital status: Single    Spouse name: Not on file  . Number of children: 0  . Years of education: Not on file  . Highest education level: Not on file  Occupational History  . Not on file  Tobacco Use  . Smoking status: Never Smoker  . Smokeless tobacco: Never Used  Vaping Use  . Vaping Use: Never used  Substance and Sexual Activity  . Alcohol use: Not Currently    Comment: socially  . Drug use: Yes    Types: Other-see comments    Comment: Etables   . Sexual activity: Yes    Birth control/protection: None, Condom  Other Topics Concern  . Not on file  Social History Narrative   Originally from [place] to apply to [employer] for [employer] studies      Works as Air cabin crew      Recent break up 10/2016      Social Determinants of Corporate investment banker Strain: Not on [employer] Insecurity: Not on file  Transportation Needs: Not on file  Physical Activity: Not on file  Stress: Not on file  Social Connections: Not on file  Intimate Partner Violence: Not on file    Outpatient Medications Prior to Visit  Medication Sig Dispense Refill  . hydrochlorothiazide (MICROZIDE) 12.5 MG capsule TAKE 1 CAPSULE(12.5 MG) BY MOUTH DAILY 90 capsule 0  . meloxicam (MOBIC) 7.5 MG tablet Take 1 tablet (7.5 mg total) by mouth daily as needed for pain. 30 tablet 1   No facility-administered medications prior to visit.      ROS:  Review of Systems  Constitutional: Negative for fever, malaise/fatigue and weight loss.  Gastrointestinal: Negative for blood in stool, constipation, diarrhea, nausea and vomiting.  Genitourinary: Positive for vaginal discharge. Negative for dyspareunia, dysuria, flank pain, frequency, hematuria, urgency, vaginal bleeding and vaginal pain.  Musculoskeletal: Negative for back pain.  Skin: Negative for itching and rash.   OBJECTIVE:   Vitals:  BP 126/74   Pulse 77   Temp  99 F (37.2 C)   Resp 16   Ht 5\' 8"  (1.727 m)   Wt 184 lb 6.4 oz (83.6 kg)   LMP 12/24/2020   SpO2 98%   BMI 28.04 kg/m   Physical Exam Vitals reviewed.  Constitutional:      Appearance: She is well-developed.  Pulmonary:     Effort: Pulmonary effort is normal.  Genitourinary:    General: Normal vulva.     Pubic Area: No rash.      Labia:        Right: No rash, tenderness or lesion.        Left: No rash, tenderness or lesion.      Vagina: Normal. No vaginal discharge, erythema or tenderness.     Cervix: Normal.     Uterus: Normal. Not enlarged and not tender.      Adnexa: Right adnexa normal and left adnexa normal.       Right: No mass or tenderness.         Left: No mass or tenderness.     Musculoskeletal:        General: Normal range of motion.     Cervical back: Normal range of motion.  Skin:    General: Skin is warm and dry.  Neurological:     General: No focal deficit present.     Mental Status: She is alert and oriented to person, place, and time.  Psychiatric:        Mood and Affect: Mood normal.        Behavior: Behavior normal.        Thought Content: Thought content normal.        Judgment: Judgment normal.     Assessment/Plan: Screening for STD (sexually transmitted disease) - Plan: HIV Antibody (routine testing w rflx), RPR, HSV 2 antibody, IgG, Hepatitis C antibody, Chlamydia/Gonococcus/Trichomonas, NAA; will f/u with results    Return if symptoms worsen or fail to improve.  Jaysen Wey B. Ryosuke Ericksen, PA-C 12/30/2020 3:08 PM

## 2020-12-30 NOTE — Patient Instructions (Signed)
I value your feedback and you entrusting us with your care. If you get a [facility] patient survey, I would appreciate you taking the time to let us know about your experience today. Thank you! ? ? ?

## 2020-12-31 ENCOUNTER — Other Ambulatory Visit (INDEPENDENT_AMBULATORY_CARE_PROVIDER_SITE_OTHER): Payer: Managed Care, Other (non HMO)

## 2020-12-31 DIAGNOSIS — R899 Unspecified abnormal finding in specimens from other organs, systems and tissues: Secondary | ICD-10-CM

## 2020-12-31 LAB — HIV ANTIBODY (ROUTINE TESTING W REFLEX): HIV Screen 4th Generation wRfx: NONREACTIVE

## 2020-12-31 LAB — HSV 2 ANTIBODY, IGG: HSV 2 IgG, Type Spec: <0.91 index (ref 0.00–0.90)

## 2020-12-31 LAB — RPR: RPR Ser Ql: NONREACTIVE

## 2020-12-31 LAB — HEPATITIS C ANTIBODY: Hep C Virus Ab: <0.1 s/co ratio (ref 0.0–0.9)

## 2021-01-01 LAB — CBC WITH DIFFERENTIAL/PLATELET
Basophils Absolute: 0 10*3/uL (ref 0.0–0.2)
Basos: 0 %
EOS (ABSOLUTE): 0.1 10*3/uL (ref 0.0–0.4)
Eos: 1 %
Hematocrit: 35.5 % (ref 34.0–46.6)
Hemoglobin: 11.3 g/dL (ref 11.1–15.9)
Immature Grans (Abs): 0 10*3/uL (ref 0.0–0.1)
Immature Granulocytes: 0 %
Lymphocytes Absolute: 3.9 10*3/uL — ABNORMAL HIGH (ref 0.7–3.1)
Lymphs: 42 %
MCH: 22.9 pg — ABNORMAL LOW (ref 26.6–33.0)
MCHC: 31.8 g/dL (ref 31.5–35.7)
MCV: 72 fL — ABNORMAL LOW (ref 79–97)
Monocytes Absolute: 0.6 10*3/uL (ref 0.1–0.9)
Monocytes: 7 %
Neutrophils Absolute: 4.5 10*3/uL (ref 1.4–7.0)
Neutrophils: 50 %
Platelets: 386 10*3/uL (ref 150–450)
RBC: 4.93 x10E6/uL (ref 3.77–5.28)
RDW: 14.7 % (ref 11.7–15.4)
WBC: 9.2 10*3/uL (ref 3.4–10.8)

## 2021-01-01 LAB — CHLAMYDIA/GONOCOCCUS/TRICHOMONAS, NAA
Chlamydia by NAA: NEGATIVE
Gonococcus by NAA: NEGATIVE
Trich vag by NAA: NEGATIVE

## 2021-01-11 ENCOUNTER — Encounter: Payer: Self-pay | Admitting: Oncology

## 2021-01-11 ENCOUNTER — Inpatient Hospital Stay: Payer: Managed Care, Other (non HMO) | Attending: Oncology | Admitting: Oncology

## 2021-01-11 ENCOUNTER — Inpatient Hospital Stay: Payer: Managed Care, Other (non HMO)

## 2021-01-11 VITALS — BP 142/91 | HR 87 | Temp 98.1°F | Resp 18 | Wt 184.4 lb

## 2021-01-11 DIAGNOSIS — D72829 Elevated white blood cell count, unspecified: Secondary | ICD-10-CM | POA: Diagnosis present

## 2021-01-11 DIAGNOSIS — Z809 Family history of malignant neoplasm, unspecified: Secondary | ICD-10-CM | POA: Diagnosis not present

## 2021-01-11 DIAGNOSIS — D563 Thalassemia minor: Secondary | ICD-10-CM | POA: Diagnosis not present

## 2021-01-11 DIAGNOSIS — Z800 Family history of malignant neoplasm of digestive organs: Secondary | ICD-10-CM | POA: Diagnosis not present

## 2021-01-11 DIAGNOSIS — D500 Iron deficiency anemia secondary to blood loss (chronic): Secondary | ICD-10-CM

## 2021-01-11 DIAGNOSIS — Z79899 Other long term (current) drug therapy: Secondary | ICD-10-CM | POA: Diagnosis not present

## 2021-01-11 DIAGNOSIS — Z803 Family history of malignant neoplasm of breast: Secondary | ICD-10-CM | POA: Diagnosis not present

## 2021-01-11 LAB — COMPREHENSIVE METABOLIC PANEL
ALT: 19 U/L (ref 0–44)
AST: 29 U/L (ref 15–41)
Albumin: 3.7 g/dL (ref 3.5–5.0)
Alkaline Phosphatase: 46 U/L (ref 38–126)
Anion gap: 8 (ref 5–15)
BUN: 10 mg/dL (ref 6–20)
CO2: 26 mmol/L (ref 22–32)
Calcium: 8.6 mg/dL — ABNORMAL LOW (ref 8.9–10.3)
Chloride: 102 mmol/L (ref 98–111)
Creatinine, Ser: 0.92 mg/dL (ref 0.44–1.00)
GFR, Estimated: >60 mL/min (ref >60)
Glucose, Bld: 105 mg/dL — ABNORMAL HIGH (ref 70–99)
Potassium: 4 mmol/L (ref 3.5–5.1)
Sodium: 136 mmol/L (ref 135–145)
Total Bilirubin: 0.6 mg/dL (ref 0.3–1.2)
Total Protein: 6.9 g/dL (ref 6.5–8.1)

## 2021-01-11 LAB — CBC WITH DIFFERENTIAL/PLATELET
Abs Immature Granulocytes: 0.02 10*3/uL (ref 0.00–0.07)
Basophils Absolute: 0 10*3/uL (ref 0.0–0.1)
Basophils Relative: 0 %
Eosinophils Absolute: 0 10*3/uL (ref 0.0–0.5)
Eosinophils Relative: 0 %
HCT: 34.7 % — ABNORMAL LOW (ref 36.0–46.0)
Hemoglobin: 11 g/dL — ABNORMAL LOW (ref 12.0–15.0)
Immature Granulocytes: 0 %
Lymphocytes Relative: 32 %
Lymphs Abs: 2.9 10*3/uL (ref 0.7–4.0)
MCH: 23 pg — ABNORMAL LOW (ref 26.0–34.0)
MCHC: 31.7 g/dL (ref 30.0–36.0)
MCV: 72.4 fL — ABNORMAL LOW (ref 80.0–100.0)
Monocytes Absolute: 0.7 10*3/uL (ref 0.1–1.0)
Monocytes Relative: 8 %
Neutro Abs: 5.6 10*3/uL (ref 1.7–7.7)
Neutrophils Relative %: 60 %
Platelets: 338 10*3/uL (ref 150–400)
RBC: 4.79 MIL/uL (ref 3.87–5.11)
RDW: 14.3 % (ref 11.5–15.5)
WBC: 9.3 10*3/uL (ref 4.0–10.5)
nRBC: 0 % (ref 0.0–0.2)

## 2021-01-11 LAB — IRON AND TIBC
Iron: 31 ug/dL (ref 28–170)
Saturation Ratios: 7 % — ABNORMAL LOW (ref 10.4–31.8)
TIBC: 419 ug/dL (ref 250–450)
UIBC: 388 ug/dL

## 2021-01-11 LAB — FERRITIN: Ferritin: 6 ng/mL — ABNORMAL LOW (ref 11–307)

## 2021-01-11 LAB — TECHNOLOGIST SMEAR REVIEW
Plt Morphology: NORMAL
RBC Morphology: NORMAL
WBC Morphology: NORMAL

## 2021-01-11 NOTE — Progress Notes (Signed)
Pt here for follow up. No new concerns voiced.   

## 2021-01-11 NOTE — Progress Notes (Signed)
Hematology/Oncology follow up note [facility] Telephone:([phone]) [phone] [phone]) [phone]   Patient Care Team: [name], FNP as PCP - General (Family Medicine)  REFERRING PROVIDER: [name], FNP  CHIEF COMPLAINTS/REASON FOR VISIT:  Lymphocytosis.  HISTORY OF PRESENTING ILLNESS:   Kim Reeves is a  30 y.o.  female with PMH listed below was seen in consultation at the request of  Jason Coop Lyn Records, FNP  for evaluation of abnormal CBC/laboratory result Patient had blood work done at [employer] [facility] office. 06/25/2019, CBC showed WBC 11.1, hemoglobin 12.2, platelet count 386,000.  Differential showed increased lymphocyte, monocyte Reviewed patient's previous lab results. Leukocytosis is chronic since 2016 ranging around 11-11.6. She is chronically microcytic.  Hemoglobin runs at low normal 12.  Patient reports feeling well.  She has no new complaints.  Denies any fever, chills, chest pain, abdominal pain, lower extremity edema.  Good energy level.  Denies any frequent infection.  No unintentional weight loss or night sweating. She has family history of sickle cell disease.  Never had any pain attack.  INTERVAL HISTORY Kim Reeves is a 30 y.o. female who has above history reviewed by me today presents for follow up visit for evaluation of lymphocytosis. Problems and complaints are listed below: Her regular blood work with primary care provider in November showed lymphocytosis of 3.6 on 10/27/2020. Patient cannot recall any triggering events at that point.  She  reports possibility of having COVID-19 as she was symptomatic and she had exposure from family members who were positive.  She eventually get tested and was negative. Repeat CBC on 12/31/2020 showed persistent leukocytosis of 3.9.  Patient was referred back to reestablish care with me for evaluation of lymphocytosis. Today she feels well at her baseline. Denies weight loss, fever, chills,  fatigue, night sweats.  She reports some abdominal pain,  bowel movement once per week.  Family history is Significant for colon cancer and breast cancer, pancreatic cancer.  Review of Systems  Constitutional: Negative for appetite change, chills, fatigue and fever.  HENT:   Negative for hearing loss and voice change.   Eyes: Negative for eye problems.  Respiratory: Negative for chest tightness and cough.   Cardiovascular: Negative for chest pain.  Gastrointestinal: Negative for abdominal distention, abdominal pain and blood in stool.  Endocrine: Negative for hot flashes.  Genitourinary: Negative for difficulty urinating and frequency.   Musculoskeletal: Negative for arthralgias.  Skin: Negative for itching and rash.  Neurological: Negative for extremity weakness.  Hematological: Negative for adenopathy.  Psychiatric/Behavioral: Negative for confusion.    MEDICAL HISTORY:  Past Medical History:  Diagnosis Date  . Anemia   . Chlamydia   . Family history of breast cancer    12/20 cancer genetic tesing letter sent  . Family history of pancreatic cancer   . GAD (generalized anxiety disorder)   . Gonorrhea   . Hypertension   . Major depressive disorder, single episode, severe without psychotic features (HCC) 01/25/2016    SURGICAL HISTORY: Past Surgical History:  Procedure Laterality Date  . NO PAST SURGERIES      SOCIAL HISTORY: Social History   Socioeconomic History  . Marital status: Single    Spouse name: Not on file  . Number of children: 0  . Years of education: Not on file  . Highest education level: Not on file  Occupational History  . Not on file  Tobacco Use  . Smoking status: Never Smoker  . Smokeless tobacco: Never Used  Vaping  Use  . Vaping Use: Never used  Substance and Sexual Activity  . Alcohol use: Not Currently    Comment: socially  . Drug use: Yes    Types: Other-see comments    Comment: Etables   . Sexual activity: Yes    Birth  control/protection: None, Condom  Other Topics Concern  . Not on file  Social History Narrative   Originally from [place]      Wants to apply to [employer] for [employer] studies      Works as Air cabin crew      Recent break up 10/2016      Social Determinants of Corporate investment banker Strain: Not on [employer] Insecurity: Not on file  Transportation Needs: Not on file  Physical Activity: Not on file  Stress: Not on file  Social Connections: Not on file  Intimate Partner Violence: Not on file    FAMILY HISTORY: Family History  Problem Relation Age of Onset  . Colon cancer Maternal Grandmother 60  . Breast cancer Maternal Grandmother 50  . Breast cancer Paternal Aunt 40  . Colon cancer Maternal Aunt 52  . Pancreatic cancer Maternal Aunt   . Breast cancer Cousin 41    ALLERGIES:  has No Known Allergies.  MEDICATIONS:  Current Outpatient Medications  Medication Sig Dispense Refill  . hydrochlorothiazide (MICROZIDE) 12.5 MG capsule TAKE 1 CAPSULE(12.5 MG) BY MOUTH DAILY 90 capsule 0  . meloxicam (MOBIC) 7.5 MG tablet Take 1 tablet (7.5 mg total) by mouth daily as needed for pain. 30 tablet 1   No current facility-administered medications for this visit.     PHYSICAL EXAMINATION: ECOG PERFORMANCE STATUS: 0 - Asymptomatic Vitals:   01/11/21 1011  BP: (!) 142/91  Pulse: 87  Resp: 18  Temp: 98.1 F (36.7 C)   Filed Weights   01/11/21 1011  Weight: 184 lb 6.4 oz (83.6 kg)    Physical Exam Constitutional:      General: She is not in acute distress. HENT:     Head: Normocephalic and atraumatic.  Eyes:     General: No scleral icterus.    Pupils: Pupils are equal, round, and reactive to light.  Cardiovascular:     Rate and Rhythm: Normal rate and regular rhythm.     Heart sounds: Normal heart sounds.  Pulmonary:     Effort: Pulmonary effort is normal. No respiratory distress.     Breath sounds: No wheezing.  Abdominal:     General: Bowel sounds are normal.  There is no distension.     Palpations: Abdomen is soft.     Tenderness: There is no abdominal tenderness.  Musculoskeletal:        General: No deformity. Normal range of motion.     Cervical back: Normal range of motion and neck supple.  Skin:    General: Skin is warm and dry.     Findings: No erythema or rash.  Neurological:     Mental Status: She is alert and oriented to person, place, and time.     Cranial Nerves: No cranial nerve deficit.     Coordination: Coordination normal.  Psychiatric:        Behavior: Behavior normal.        Thought Content: Thought content normal.     LABORATORY DATA:  I have reviewed the data as listed Lab Results  Component Value Date   WBC 9.3 01/11/2021   HGB 11.0 (L) 01/11/2021   HCT 34.7 (L) 01/11/2021  MCV 72.4 (L) 01/11/2021   PLT 338 01/11/2021   Recent Labs    05/26/20 1251 10/27/20 1114 01/11/21 1041  NA 140 140 136  K 4.5 4.6 4.0  CL 101 104 102  CO2 25 25 26   GLUCOSE 80 86 105*  BUN 12 10 10   CREATININE 0.85 0.83 0.92  CALCIUM 9.1 9.2 8.6*  GFRNONAA 92 94 >60  GFRAA 106 109  --   PROT  --   --  6.9  ALBUMIN  --   --  3.7  AST  --   --  29  ALT  --   --  19  ALKPHOS  --   --  46  BILITOT  --   --  0.6   Iron/TIBC/Ferritin/ %Sat    Component Value Date/Time   IRON 31 01/11/2021 1041   IRON 98 02/05/2017 1500   TIBC 419 01/11/2021 1041   TIBC 387 02/05/2017 1500   FERRITIN 6 (L) 01/11/2021 1041   IRONPCTSAT 7 (L) 01/11/2021 1041   IRONPCTSAT 25 02/05/2017 1500      RADIOGRAPHIC STUDIES: I have personally reviewed the radiological images as listed and agreed with the findings in the report. No results found.    ASSESSMENT & PLAN:  1. Leukocytosis, unspecified type   2. Thalassemia minor   3. Family history of cancer   4. Iron deficiency anemia due to chronic blood loss    #Leukocytosis can be secondary to acute on chronic inflammation, autoimmune disease, Bone marrow disorders-leukemia lymphoma. Check  smear, flow cytometry.  She has had hepatitis and HIV testing done in the past which were both negative.  #Family history of cancer I recommend patient establish care with genetic counselor for testing.  She agrees and I placed a referral today. Regarding to her gastroenterology symptoms, recommend patient to establish care with gastroenterology.  Patient prefers to talk with primary care provider and then decide.  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  Check iron TIBC ferritin today. Iron panel is consistent with severe iron deficiency.  She is already quite constipated.  Venofer treatments at the next visit.  I will discuss with her about IV Venofer at the next visit.   Orders Placed This Encounter  Procedures  . CBC with Differential/Platelet    Standing Status:   Future    Number of Occurrences:   1    Standing Expiration Date:   01/11/2022  . Technologist smear review    Standing Status:   Future    Number of Occurrences:   1    Standing Expiration Date:   01/11/2022  . Flow cytometry panel-leukemia/lymphoma work-up    Standing Status:   Future    Number of Occurrences:   1    Standing Expiration Date:   01/11/2022  . Ferritin    Standing Status:   Future    Number of Occurrences:   1    Standing Expiration Date:   01/11/2022  . Iron and TIBC    Standing Status:   Future    Number of Occurrences:   1    Standing Expiration Date:   01/11/2022  . Comprehensive metabolic panel    Standing Status:   Future    Number of Occurrences:   1    Standing Expiration Date:   01/11/2022  . Ambulatory referral to Genetics    Referral Priority:   Routine    Referral Type:   Consultation  Referral Reason:   Specialty Services Required    Number of Visits Requested:   1    All questions were answered. The patient knows to call the clinic with any problems questions or concerns.  cc [name], FNP    Return of visit: 2 weeks  Rickard Patience, MD, PhD Hematology Oncology [facility] 01/11/2021

## 2021-01-13 LAB — COMP PANEL: LEUKEMIA/LYMPHOMA

## 2021-01-25 ENCOUNTER — Inpatient Hospital Stay (HOSPITAL_BASED_OUTPATIENT_CLINIC_OR_DEPARTMENT_OTHER): Payer: Managed Care, Other (non HMO) | Admitting: Oncology

## 2021-01-25 ENCOUNTER — Encounter: Payer: Self-pay | Admitting: Oncology

## 2021-01-25 DIAGNOSIS — Z809 Family history of malignant neoplasm, unspecified: Secondary | ICD-10-CM | POA: Diagnosis not present

## 2021-01-25 DIAGNOSIS — D509 Iron deficiency anemia, unspecified: Secondary | ICD-10-CM

## 2021-01-25 DIAGNOSIS — D500 Iron deficiency anemia secondary to blood loss (chronic): Secondary | ICD-10-CM | POA: Insufficient documentation

## 2021-01-25 NOTE — Progress Notes (Signed)
HEMATOLOGY-ONCOLOGY TeleHEALTH VISIT PROGRESS NOTE  I connected with Kim Reeves on 01/25/21 at  2:30 PM EST by video enabled telemedicine visit and verified that I am speaking with the correct person using two identifiers. I discussed the limitations, risks, security and privacy concerns of performing an evaluation and management service by telemedicine and the availability of in-person appointments. I also discussed with the patient that there may be a patient responsible charge related to this service. The patient expressed understanding and agreed to proceed.   Other persons participating in the visit and their role in the encounter:  None  Patient's location: in her car- not driving Provider's location: office Chief Complaint: follow up for anemia.    INTERVAL HISTORY Kim Reeves is a 30 y.o. female who has above history reviewed by me today presents for follow up visit for management of anemia, thalassemia minor.  Problems and complaints are listed below:  Patient presents to discuss lab results. No new complaints.   Review of Systems  Constitutional: Negative for appetite change, chills and fever.       Feels sluggish  HENT:   Negative for hearing loss and voice change.   Eyes: Negative for eye problems.  Respiratory: Negative for chest tightness and cough.   Cardiovascular: Negative for chest pain.  Gastrointestinal: Negative for abdominal distention, abdominal pain and blood in stool.  Endocrine: Negative for hot flashes.  Genitourinary: Negative for difficulty urinating and frequency.   Musculoskeletal: Negative for arthralgias.  Skin: Negative for itching and rash.  Neurological: Negative for extremity weakness.  Hematological: Negative for adenopathy.  Psychiatric/Behavioral: Negative for confusion.    Past Medical History:  Diagnosis Date  . Anemia   . Chlamydia   . Family history of breast cancer    12/20 cancer genetic tesing letter sent  . Family history of  pancreatic cancer   . GAD (generalized anxiety disorder)   . Gonorrhea   . Hypertension   . 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 NO PAST SURGERIES      Family History  Problem Relation Age of Onset  . Colon cancer Maternal Grandmother 60  . Breast cancer Maternal Grandmother 27  . Breast cancer Paternal Aunt 40  . Colon cancer Maternal Aunt 52  . Pancreatic cancer Maternal Aunt   . Breast cancer Cousin 30    Social History   Socioeconomic History  . Marital status: Single    Spouse name: Not on file  . Number of children: 0  . Years of education: Not on file  . Highest education level: Not on file  Occupational History  . Not on file  Tobacco Use  . Smoking status: Never Smoker  . Smokeless tobacco: Never Used  Vaping Use  . Vaping Use: Never used  Substance and Sexual Activity  . Alcohol use: Not Currently    Comment: socially  . Drug use: Yes    Types: Other-see comments    Comment: Etables   . Sexual activity: Yes    Birth control/protection: None, Condom  Other Topics Concern  . Not on file  Social History Narrative   Originally from [place]      Wants to apply to [employer] for [employer] studies      Works as Air cabin crew      Recent break up 10/2016      Social Determinants of [employer]: Not  on file  Food Insecurity: Not on file  Transportation Needs: Not on file  Physical Activity: Not on file  Stress: Not on file  Social Connections: Not on file  Intimate Partner Violence: Not on file    Current Outpatient Medications on File Prior to Visit  Medication Sig Dispense Refill  . hydrochlorothiazide (MICROZIDE) 12.5 MG capsule TAKE 1 CAPSULE(12.5 MG) BY MOUTH DAILY 90 capsule 0  . meloxicam (MOBIC) 7.5 MG tablet Take 1 tablet (7.5 mg total) by mouth daily as needed for pain. 30 tablet 1   No current  facility-administered medications on file prior to visit.    No Known Allergies     Observations/Objective: Today's Vitals   01/25/21 1430  PainSc: 0-No pain   There is no height or weight on file to calculate BMI.  Physical Exam Constitutional:      General: She is not in acute distress. Neurological:     Mental Status: She is alert.     CBC    Component Value Date/Time   WBC 9.3 01/11/2021 1041   RBC 4.79 01/11/2021 1041   HGB 11.0 (L) 01/11/2021 1041   HGB 11.3 12/31/2020 1614   HCT 34.7 (L) 01/11/2021 1041   HCT 35.5 12/31/2020 1614   PLT 338 01/11/2021 1041   PLT 386 12/31/2020 1614   MCV 72.4 (L) 01/11/2021 1041   MCV 72 (L) 12/31/2020 1614   MCH 23.0 (L) 01/11/2021 1041   MCHC 31.7 01/11/2021 1041   RDW 14.3 01/11/2021 1041   RDW 14.7 12/31/2020 1614   LYMPHSABS 2.9 01/11/2021 1041   LYMPHSABS 3.9 (H) 12/31/2020 1614   MONOABS 0.7 01/11/2021 1041   EOSABS 0.0 01/11/2021 1041   EOSABS 0.1 12/31/2020 1614   BASOSABS 0.0 01/11/2021 1041   BASOSABS 0.0 12/31/2020 1614    CMP     Component Value Date/Time   NA 136 01/11/2021 1041   NA 140 10/27/2020 1114   K 4.0 01/11/2021 1041   CL 102 01/11/2021 1041   CO2 26 01/11/2021 1041   GLUCOSE 105 (H) 01/11/2021 1041   BUN 10 01/11/2021 1041   BUN 10 10/27/2020 1114   CREATININE 0.92 01/11/2021 1041   CALCIUM 8.6 (L) 01/11/2021 1041   PROT 6.9 01/11/2021 1041   PROT 7.3 06/25/2019 1609   ALBUMIN 3.7 01/11/2021 1041   ALBUMIN 4.3 06/25/2019 1609   AST 29 01/11/2021 1041   ALT 19 01/11/2021 1041   ALKPHOS 46 01/11/2021 1041   BILITOT 0.6 01/11/2021 1041   BILITOT 0.3 06/25/2019 1609   GFRNONAA >60 01/11/2021 1041   GFRAA 109 10/27/2020 1114     Assessment and Plan: 1. Iron deficiency anemia due to chronic blood loss   2. Family history of cancer   3. Microcytic anemia     #Iron deficiency anemia, Iron panel is consistent with iron deficiency. Patient has baseline severe constipation and most  likely not going to tolerate oral iron supplementation. Plan IV iron with Venofer 200mg  weekly x 4 doses. Allergy reactions/infusion reaction including anaphylactic reaction discussed with patient. Other side effects include but not limited to high blood pressure, skin rash, weight gain, leg swelling, etc. Patient voices understanding and willing to proceed.  #Family history of cancer  I have referred patient to establish care with genetic counselor.  #constipation, I recommended GI evaluation. Patient plans to discuss with primary care provider and then decide. 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    Follow Up  Instructions: 3 months  I discussed the assessment and treatment plan with the patient. The patient was provided an opportunity to ask questions and all were answered. The patient agreed with the plan and demonstrated an understanding of the instructions.  The patient was advised to call back or seek an in-person evaluation if the symptoms worsen or if the condition fails to improve as anticipated.    Rickard Patience, MD 01/25/2021 10:20 PM

## 2021-01-25 NOTE — Progress Notes (Signed)
Patient verified using two identifiers for virtual visit via telephone today.  Patient is very fatigued.

## 2021-01-28 ENCOUNTER — Inpatient Hospital Stay: Payer: Managed Care, Other (non HMO)

## 2021-01-28 VITALS — BP 122/83 | HR 79 | Temp 98.2°F | Resp 20

## 2021-01-28 DIAGNOSIS — D72829 Elevated white blood cell count, unspecified: Secondary | ICD-10-CM | POA: Diagnosis not present

## 2021-01-28 DIAGNOSIS — D500 Iron deficiency anemia secondary to blood loss (chronic): Secondary | ICD-10-CM

## 2021-01-28 MED ORDER — IRON SUCROSE 20 MG/ML IV SOLN
200.0000 mg | Freq: Once | INTRAVENOUS | Status: AC
  Administered 2021-01-28: 200 mg via INTRAVENOUS
  Filled 2021-01-28: qty 10

## 2021-01-28 MED ORDER — SODIUM CHLORIDE 0.9 % IV SOLN: 200.0000 mg | Freq: Once | INTRAVENOUS | Status: DC

## 2021-01-28 MED ORDER — SODIUM CHLORIDE 0.9 % IV SOLN
Freq: Once | INTRAVENOUS | Status: AC
  Filled 2021-01-28: qty 250

## 2021-01-31 ENCOUNTER — Inpatient Hospital Stay: Payer: Managed Care, Other (non HMO)

## 2021-01-31 VITALS — BP 132/92 | HR 73 | Temp 98.3°F | Resp 20

## 2021-01-31 DIAGNOSIS — D500 Iron deficiency anemia secondary to blood loss (chronic): Secondary | ICD-10-CM

## 2021-01-31 DIAGNOSIS — D72829 Elevated white blood cell count, unspecified: Secondary | ICD-10-CM | POA: Diagnosis not present

## 2021-01-31 MED ORDER — SODIUM CHLORIDE 0.9 % IV SOLN: 200.0000 mg | Freq: Once | INTRAVENOUS | Status: DC

## 2021-01-31 MED ORDER — SODIUM CHLORIDE 0.9 % IV SOLN
Freq: Once | INTRAVENOUS | Status: AC
  Filled 2021-01-31: qty 250

## 2021-01-31 MED ORDER — IRON SUCROSE 20 MG/ML IV SOLN
200.0000 mg | Freq: Once | INTRAVENOUS | Status: AC
  Administered 2021-01-31: 200 mg via INTRAVENOUS
  Filled 2021-01-31: qty 10

## 2021-02-01 ENCOUNTER — Inpatient Hospital Stay: Payer: Managed Care, Other (non HMO) | Attending: Oncology | Admitting: Licensed Clinical Social Worker

## 2021-02-01 ENCOUNTER — Encounter: Payer: Self-pay | Admitting: Licensed Clinical Social Worker

## 2021-02-01 ENCOUNTER — Inpatient Hospital Stay: Payer: Managed Care, Other (non HMO)

## 2021-02-01 DIAGNOSIS — Z803 Family history of malignant neoplasm of breast: Secondary | ICD-10-CM | POA: Diagnosis not present

## 2021-02-01 DIAGNOSIS — Z800 Family history of malignant neoplasm of digestive organs: Secondary | ICD-10-CM | POA: Insufficient documentation

## 2021-02-01 DIAGNOSIS — Z801 Family history of malignant neoplasm of trachea, bronchus and lung: Secondary | ICD-10-CM | POA: Insufficient documentation

## 2021-02-01 DIAGNOSIS — D500 Iron deficiency anemia secondary to blood loss (chronic): Secondary | ICD-10-CM | POA: Insufficient documentation

## 2021-02-01 NOTE — Progress Notes (Signed)
REFERRING PROVIDER: Earlie Server, MD [place],  [place] [id number]  PRIMARY PROVIDER:  Burnard Hawthorne, FNP  PRIMARY REASON FOR VISIT:  1. Family history of breast cancer   2. Family history of colon cancer   3. Family history of lung cancer   4. Family history of throat cancer      HISTORY OF PRESENT ILLNESS:   [name], a 30 y.o. female, was seen for a [facility] cancer genetics consultation at the request of Dr. Tasia Catchings due to a family history of cancer.  Ms. [name] presents to clinic today to discuss the possibility of a hereditary predisposition to cancer, genetic testing, and to further clarify her future cancer risks, as well as potential cancer risks for family members.    [name] is a 30 y.o. female with no personal history of cancer.    CANCER HISTORY:  Oncology History   No history exists.     RISK FACTORS:  Menarche was at age 33.   OCP use for approximately 15 years. (on and off) Ovaries intact: yes.  Hysterectomy: no.  Menopausal status: premenopausal.  HRT use: 0 years. Colonoscopy: no; not examined. Mammogram within the last year: no. Number of breast biopsies: 0. Up to date with pelvic exams: yes.   Past Medical History:  Diagnosis Date  . Anemia   . Chlamydia   . Family history of breast cancer    12/20 cancer genetic tesing letter sent  . Family history of breast cancer   . Family history of colon cancer   . Family history of lung cancer   . Family history of pancreatic cancer   . Family history of throat cancer   . GAD (generalized anxiety disorder)   . Gonorrhea   . Hypertension   . Major depressive disorder, single episode, severe without psychotic features ([facility]) 01/25/2016    Past Surgical History:  Procedure Laterality Date  . NO PAST SURGERIES      Social History   Socioeconomic History  . Marital status: Single    Spouse name: Not on file  . Number of children: 0  . Years of education: Not on file  . Highest  education level: Not on file  Occupational History  . Not on file  Tobacco Use  . Smoking status: Never Smoker  . Smokeless tobacco: Never Used  Vaping Use  . Vaping Use: Never used  Substance and Sexual Activity  . Alcohol use: Not Currently    Comment: socially  . Drug use: Yes    Types: Other-see comments    Comment: Etables   . Sexual activity: Yes    Birth control/protection: None, Condom  Other Topics Concern  . Not on file  Social History Narrative   Originally from [place] to apply to [employer] for [employer] studies      Works as Scientist, research (medical)      Recent break up 10/2016      Social Determinants of Radio broadcast assistant Strain: Not on [employer] Insecurity: Not on file  Transportation Needs: Not on file  Physical Activity: Not on file  Stress: Not on file  Social Connections: Not on file     FAMILY HISTORY:  We obtained a detailed, 4-generation family history.  Significant diagnoses are listed below: Family History  Problem Relation Age of Onset  . Colon cancer Maternal Grandmother 14  . Breast cancer Maternal Grandmother 90  . Breast  cancer Paternal Aunt 81  . Colon cancer Maternal Aunt 16  . Pancreatic cancer Maternal Aunt   . Breast cancer Cousin 46   [name] has 1 brother. Parents are both living, no history of cancer.   [name] maternal aunt had colon cancer at 70 and also had history of benign brain tumor in her 77s. Maternal uncle had colon cancer in his 98s. Maternal grandmother had colon cancer in her 24s and breast in her 48s. Most female relatives had hysterectomy in their 87s due to fibroids.   [name] paternal cousin had breast cancer and passed at 50.   [name] is unaware of previous family history of genetic testing for hereditary cancer risks. Patient's maternal ancestors are of African American descent, and paternal ancestors are of African American descent. There is no reported Ashkenazi Jewish ancestry. There is  no known consanguinity.   GENETIC COUNSELING ASSESSMENT: [name] is a 30 y.o. female with a family history of colon cancer and breast cancer which is somewhat suggestive of a hereditary cancer syndrome and predisposition to cancer. We, therefore, discussed and recommended the following at today's visit.   DISCUSSION: We discussed that approximately 5-10% of colon and breast cancer is hereditary  Most cases of hereditary colon cancer are associated with Lynch syndrome genes, although there are other genes associated with hereditary cancer as well including BRCA1/BRCA2 and CHEK2 .  We discussed that testing is beneficial for several reasons including knowing about cancer risks, identifying potential screening and risk-reduction options that may be appropriate, and to understand if other family members could be at risk for cancer and allow them to undergo genetic testing.   We reviewed the characteristics, features and inheritance patterns of hereditary cancer syndromes. We also discussed genetic testing, including the appropriate family members to test, the process of testing, insurance coverage and turn-around-time for results. We discussed the implications of a negative, positive and/or variant of uncertain significant result. We recommended [name] pursue genetic testing for the [facility] CancerNext-Expanded gene panel.   The CancerNext-Expanded + RNAinsight gene panel offered by [facility] and includes sequencing and rearrangement analysis for the following 77 genes: IP, ALK, APC*, ATM*, AXIN2, BAP1, BARD1, BLM, BMPR1A, BRCA1*, BRCA2*, BRIP1*, CDC73, CDH1*,CDK4, CDKN1B, CDKN2A, CHEK2*, CTNNA1, DICER1, FANCC, FH, FLCN, GALNT12, KIF1B, LZTR1, MAX, MEN1, MET, MLH1*, MSH2*, MSH3, MSH6*, MUTYH*, NBN, NF1*, NF2, NTHL1, PALB2*, PHOX2B, PMS2*, POT1, PRKAR1A, PTCH1, PTEN*, RAD51C*, RAD51D*,RB1, RECQL, RET, SDHA, SDHAF2, SDHB, SDHC, SDHD, SMAD4, SMARCA4, SMARCB1, SMARCE1, STK11, SUFU, TMEM127, TP53*,TSC1, TSC2,  VHL and XRCC2 (sequencing and deletion/duplication); EGFR, EGLN1, HOXB13, KIT, MITF, PDGFRA, POLD1 and POLE (sequencing only); EPCAM and GREM1 (deletion/duplication only).   Based on Ms. [name]'s family history of cancer, she meets medical criteria for genetic testing. Despite that she meets criteria, she may still have an out of pocket cost. We discussed that if her out of pocket cost for testing is over $100, the laboratory will call and confirm whether she wants to proceed with testing.  If the out of pocket cost of testing is less than $100 she will be billed by the genetic testing laboratory.   We discussed that some people do not want to undergo genetic testing due to fear of genetic discrimination.  A federal law called the Genetic Information Non-Discrimination Act (GINA) of 2008 helps protect individuals against genetic discrimination based on their genetic test results.  It impacts both health insurance and employment.  For health insurance, it protects against increased premiums, being  kicked off insurance or being forced to take a test in order to be insured.  For employment it protects against hiring, firing and promoting decisions based on genetic test results.  Health status due to a cancer diagnosis is not protected under GINA.  This law does not protect life insurance, disability insurance, or other types of insurance.   PLAN: After considering the risks, benefits, and limitations, Ms. Skorupski provided informed consent to pursue genetic testing and the blood sample was sent to [facility] for analysis of the CancerNext-Expanded+RNA panel. Results should be available within approximately 2-3 weeks' time, at which point they will be disclosed by telephone to Ms. Griffith, as will any additional recommendations warranted by these results. Ms. Meadow will receive a summary of her genetic counseling visit and a copy of her results once available. This information will also be available in Epic.    Ms. Leite questions were answered to her satisfaction today. Our contact information was provided should additional questions or concerns arise. Thank you for the referral and allowing [place] to share in the care of your patient.   Faith Rogue, MS, [facility] Genetic Counselor [email or web address] Phone: [phone]  The patient was seen for a total of 30 minutes in face-to-face genetic counseling.  Dr. Grayland Ormond was available for discussion regarding this case.   _______________________________________________________________________ For Office Staff:  Number of people involved in session: 1 Was an Intern/ student involved with case: no

## 2021-02-03 ENCOUNTER — Ambulatory Visit: Payer: Managed Care, Other (non HMO)

## 2021-02-08 ENCOUNTER — Ambulatory Visit: Payer: Managed Care, Other (non HMO)

## 2021-02-08 ENCOUNTER — Other Ambulatory Visit: Payer: Managed Care, Other (non HMO)

## 2021-02-10 ENCOUNTER — Inpatient Hospital Stay: Payer: Managed Care, Other (non HMO)

## 2021-02-10 VITALS — BP 133/87 | HR 70 | Temp 96.9°F | Resp 16

## 2021-02-10 DIAGNOSIS — D500 Iron deficiency anemia secondary to blood loss (chronic): Secondary | ICD-10-CM | POA: Diagnosis present

## 2021-02-10 LAB — PREGNANCY, URINE: Preg Test, Ur: NEGATIVE

## 2021-02-10 MED ORDER — SODIUM CHLORIDE 0.9 % IV SOLN
Freq: Once | INTRAVENOUS | Status: AC
Start: 2021-02-10 — End: 2021-02-10
  Filled 2021-02-10: qty 250

## 2021-02-10 MED ORDER — IRON SUCROSE 20 MG/ML IV SOLN
200.0000 mg | Freq: Once | INTRAVENOUS | Status: AC
  Administered 2021-02-10: 200 mg via INTRAVENOUS
  Filled 2021-02-10: qty 10

## 2021-02-10 MED ORDER — SODIUM CHLORIDE 0.9 % IV SOLN: 200.0000 mg | Freq: Once | INTRAVENOUS | Status: DC

## 2021-02-21 ENCOUNTER — Ambulatory Visit: Payer: Self-pay | Admitting: Licensed Clinical Social Worker

## 2021-02-21 ENCOUNTER — Encounter: Payer: Self-pay | Admitting: Licensed Clinical Social Worker

## 2021-02-21 ENCOUNTER — Telehealth: Payer: Self-pay | Admitting: Licensed Clinical Social Worker

## 2021-02-21 DIAGNOSIS — Z1379 Encounter for other screening for genetic and chromosomal anomalies: Secondary | ICD-10-CM

## 2021-02-21 DIAGNOSIS — Z800 Family history of malignant neoplasm of digestive organs: Secondary | ICD-10-CM

## 2021-02-21 DIAGNOSIS — Z803 Family history of malignant neoplasm of breast: Secondary | ICD-10-CM

## 2021-02-21 DIAGNOSIS — Z801 Family history of malignant neoplasm of trachea, bronchus and lung: Secondary | ICD-10-CM

## 2021-02-21 NOTE — Progress Notes (Signed)
HPI:  Kim Reeves was previously seen in the [place] clinic due to a family history of cancer and concerns regarding a hereditary predisposition to cancer. Please refer to our prior cancer genetics clinic note for more information regarding our discussion, assessment and recommendations, at the time. Kim Reeves recent genetic test results were disclosed to her, as were recommendations warranted by these results. These results and recommendations are discussed in more detail below.  CANCER HISTORY:  Oncology History   No history exists.    FAMILY HISTORY:  We obtained a detailed, 4-generation family history.  Significant diagnoses are listed below: Family History  Problem Relation Age of Onset  . Colon cancer Maternal Grandmother 54  . Breast cancer Maternal Grandmother 85  . Breast cancer Paternal Aunt 84  . Colon cancer Maternal Aunt 80  . Pancreatic cancer Maternal Aunt   . Breast cancer Cousin 35    Kim Reeves has 1 brother. Parents are both living, no history of cancer.   Kim Reeves maternal aunt had colon cancer at 51 and also had history of benign brain tumor in her 54s. Maternal uncle had colon cancer in his 63s. Maternal grandmother had colon cancer in her 72s and breast in her 48s. Most female relatives had hysterectomy in their 92s due to fibroids.   Kim Reeves paternal cousin had breast cancer and passed at 38.   Kim Reeves is unaware of previous family history of genetic testing for hereditary cancer risks. Patient's maternal ancestors are of African American descent, and paternal ancestors are of African American descent. There is no reported Ashkenazi Jewish ancestry. There is no known consanguinity.     GENETIC TEST RESULTS: Genetic testing reported out on 02/18/2021 through the [facility] CancerNext-Expanded+RNA cancer panel found no pathogenic mutations.   The CancerNext-Expanded + RNAinsight gene panel offered by [facility] and includes  sequencing and rearrangement analysis for the following 77 genes: IP, ALK, APC*, ATM*, AXIN2, BAP1, BARD1, BLM, BMPR1A, BRCA1*, BRCA2*, BRIP1*, CDC73, CDH1*,CDK4, CDKN1B, CDKN2A, CHEK2*, CTNNA1, DICER1, FANCC, FH, FLCN, GALNT12, KIF1B, LZTR1, MAX, MEN1, MET, MLH1*, MSH2*, MSH3, MSH6*, MUTYH*, NBN, NF1*, NF2, NTHL1, PALB2*, PHOX2B, PMS2*, POT1, PRKAR1A, PTCH1, PTEN*, RAD51C*, RAD51D*,RB1, RECQL, RET, SDHA, SDHAF2, SDHB, SDHC, SDHD, SMAD4, SMARCA4, SMARCB1, SMARCE1, STK11, SUFU, TMEM127, TP53*,TSC1, TSC2, VHL and XRCC2 (sequencing and deletion/duplication); EGFR, EGLN1, HOXB13, KIT, MITF, PDGFRA, POLD1 and POLE (sequencing only); EPCAM and GREM1 (deletion/duplication only).   The test report has been scanned into EPIC and is located under the Molecular Pathology section of the Results Review tab.  A portion of the result report is included below for reference.     We discussed with Kim Reeves that because current genetic testing is not perfect, it is possible there may be a gene mutation in one of these genes that current testing cannot detect, but that chance is small.  We also discussed, that there could be another gene that has not yet been discovered, or that we have not yet tested, that is responsible for the cancer diagnoses in the family. It is also possible there is a hereditary cause for the cancer in the family that Kim Reeves did not inherit and therefore was not identified in her testing.  Therefore, it is important to remain in touch with cancer genetics in the future so that we can continue to offer Kim Reeves the most up to date genetic testing.   Genetic testing did identify a variant of uncertain significance (VUS) in the SDHA gene  called [id number]  At this time, it is unknown if this variant is associated with increased cancer risk or if this is a normal finding, but most variants such as this get reclassified to being inconsequential. It should not be used to make medical management decisions.  With time, we suspect the lab will determine the significance of this variant, if any. If we do learn more about it, we will try to contact Kim Reeves to discuss it further. However, it is important to stay in touch with [place] periodically and keep the address and phone number up to date.  ADDITIONAL GENETIC TESTING: We discussed with Kim Reeves that her genetic testing was fairly extensive.  If there are genes identified to increase cancer risk that can be analyzed in the future, we would be happy to discuss and coordinate this testing at that time.    CANCER SCREENING RECOMMENDATIONS: Kim Reeves test result is considered negative (normal).  This means that we have not identified a hereditary cause for her  family history of cancer at this time.   While reassuring, this does not definitively rule out a hereditary predisposition to cancer. It is still possible that there could be genetic mutations that are undetectable by current technology. There could be genetic mutations in genes that have not been tested or identified to increase cancer risk.  Therefore, it is recommended she continue to follow the cancer management and screening guidelines provided by her  primary healthcare provider.   An individual's cancer risk and medical management are not determined by genetic test results alone. Overall cancer risk assessment incorporates additional factors, including personal medical history, family history, and any available genetic information that may result in a personalized plan for cancer prevention and surveillance.  Based on Kim Reeves personal and family history of cancer as well as her genetic test results, risk model Harriett Rush was used to estimate her risk of developing breast cancer. This estimates her lifetime risk of developing breast cancer to be approximately 16%.  The patient's lifetime breast cancer risk is a preliminary estimate based on available information using one of several models  endorsed by the [place] (ACS). The ACS recommends consideration of breast MRI screening as an adjunct to mammography for patients at high risk (defined as 20% or greater lifetime risk).      RECOMMENDATIONS FOR FAMILY MEMBERS:  Relatives in this family might be at some increased risk of developing cancer, over the general population risk, simply due to the family history of cancer.  We recommended female relatives in this family have a yearly mammogram beginning at age 31, or 85 years younger than the earliest onset of cancer, an annual clinical breast exam, and perform monthly breast self-exams. Female relatives in this family should also have a gynecological exam as recommended by their primary provider.  All family members should be referred for colonoscopy starting at age 9.    It is also possible there is a hereditary cause for the cancer in Kim Reeves family that she did not inherit and therefore was not identified in her.  Based on Kim Reeves family history, we recommended maternal and paternal relatives, especially those who have had cancer,  have genetic counseling and testing. Kim Reeves will let us know if we can be of any assistance in coordinating genetic counseling and/or testing for these family members.  FOLLOW-UP: Lastly, we discussed with Kim Reeves. Magda that cancer genetics is a rapidly advancing field and it is possible that  new genetic tests will be appropriate for her and/or her family members in the future. We encouraged her to remain in contact with cancer genetics on an annual basis so we can update her personal and family histories and let her know of advances in cancer genetics that may benefit this family.   Our contact number was provided. Kim Reeves. Seybold questions were answered to her satisfaction, and she knows she is welcome to call us at anytime with additional questions or concerns.   Faith Rogue, Kim Reeves, [facility] Genetic  Counselor [email or web address] Phone: [phone]

## 2021-02-21 NOTE — Telephone Encounter (Signed)
Revealed negative genetic testing.  Revealed that a VUS in SDHA was identified. This normal result is reassuring,  It is unlikely that there is an increased risk of cancer due to a mutation in one of these genes.  However, genetic testing is not perfect, and cannot definitively rule out a hereditary cause.  It will be important for her to keep in contact with genetics to learn if any additional testing may be needed in the future.

## 2021-03-03 ENCOUNTER — Encounter: Payer: Self-pay | Admitting: Internal Medicine

## 2021-03-03 ENCOUNTER — Telehealth (INDEPENDENT_AMBULATORY_CARE_PROVIDER_SITE_OTHER): Payer: Managed Care, Other (non HMO) | Admitting: Internal Medicine

## 2021-03-03 ENCOUNTER — Telehealth: Payer: Self-pay

## 2021-03-03 VITALS — Ht 68.0 in | Wt 184.0 lb

## 2021-03-03 DIAGNOSIS — K5900 Constipation, unspecified: Secondary | ICD-10-CM | POA: Diagnosis not present

## 2021-03-03 DIAGNOSIS — R112 Nausea with vomiting, unspecified: Secondary | ICD-10-CM | POA: Diagnosis not present

## 2021-03-03 MED ORDER — ONDANSETRON HCL 4 MG PO TABS: 4.0000 mg | ORAL_TABLET | Freq: Three times a day (TID) | ORAL | 1 refills | Status: DC | PRN

## 2021-03-03 MED ORDER — DOCUSATE SODIUM 100 MG PO CAPS: 100.0000 mg | ORAL_CAPSULE | Freq: Two times a day (BID) | ORAL | 0 refills | Status: DC | PRN

## 2021-03-03 MED ORDER — POLYETHYLENE GLYCOL 3350 17 G PO PACK: 17.0000 g | PACK | Freq: Every day | ORAL | 0 refills | Status: DC | PRN

## 2021-03-03 NOTE — Patient Instructions (Signed)
Bland Diet A bland diet consists of foods that are often soft and do not have a lot of fat, fiber, or extra seasonings. Foods without fat, fiber, or seasoning are easier for the body to digest. They are also less likely to irritate your mouth, throat, stomach, and other parts of your digestive system. A bland diet is sometimes called a BRAT diet. What is my plan? Your health care provider or food and nutrition specialist (dietitian) may recommend specific changes to your diet to prevent symptoms or to treat your symptoms. These changes may include:  Eating small meals often.  Cooking food until it is soft enough to chew easily.  Chewing your food well.  Drinking fluids slowly.  Not eating foods that are very spicy, sour, or fatty.  Not eating citrus fruits, such as oranges and grapefruit. What do I need to know about this diet?  Eat a variety of foods from the bland diet food list.  Do not follow a bland diet longer than needed.  Ask your health care provider whether you should take vitamins or supplements. What foods can I eat? Grains Hot cereals, such as cream of wheat. Rice. Bread, crackers, or tortillas made from refined white flour.   Vegetables Canned or cooked vegetables. Mashed or boiled potatoes. Fruits Bananas. Applesauce. Other types of cooked or canned fruit with the skin and seeds removed, such as canned peaches or pears.   Meats and other proteins Scrambled eggs. Creamy peanut butter or other nut butters. Lean, well-cooked meats, such as chicken or fish. Tofu. Soups or broths.   Dairy Low-fat dairy products, such as milk, cottage cheese, or yogurt. Beverages Water. Herbal tea. Apple juice.   Fats and oils Mild salad dressings. Canola or olive oil. Sweets and desserts Pudding. Custard. Fruit gelatin. Ice cream. The items listed above may not be a complete list of recommended foods and beverages. Contact a dietitian for more options. What foods are not  recommended? Grains Whole grain breads and cereals. Vegetables Raw vegetables. Fruits Raw fruits, especially citrus, berries, or dried fruits. Dairy Whole fat dairy foods. Beverages Caffeinated drinks. Alcohol. Seasonings and condiments Strongly flavored seasonings or condiments. Hot sauce. Salsa. Other foods Spicy foods. Fried foods. Sour foods, such as pickled or fermented foods. Foods with high sugar content. Foods high in fiber. The items listed above may not be a complete list of foods and beverages to avoid. Contact a dietitian for more information. Summary  A bland diet consists of foods that are often soft and do not have a lot of fat, fiber, or extra seasonings.  Foods without fat, fiber, or seasoning are easier for the body to digest.  Check with your health care provider to see how long you should follow this diet plan. It is not meant to be followed for long periods. This information is not intended to replace advice given to you by your health care provider. Make sure you discuss any questions you have with your health care provider. Document Revised: 12/19/2017 Document Reviewed: 12/19/2017 Elsevier Patient Education  2021 Elsevier Inc.  Nausea and Vomiting, Adult Nausea is the feeling that you have an upset stomach or that you are about to vomit. Vomiting is when stomach contents are thrown up and out of the mouth as a result of nausea. Vomiting can make you feel weak and cause you to become dehydrated. Dehydration can make you feel tired and thirsty, cause you to have a dry mouth, and decrease how often you urinate.  Older adults and people with other diseases or a weak disease-fighting system (immune system) are at higher risk for dehydration. It is important to treat your nausea and vomiting as told by your health care provider. Follow these instructions at home: Watch your symptoms for any changes. Tell your health care provider about them. Follow these instructions  to care for yourself at home. Eating and drinking  Take an oral rehydration solution (ORS). This is a drink that is sold at pharmacies and retail stores.  Drink clear fluids slowly and in small amounts as you are able. Clear fluids include water, ice chips, low-calorie sports drinks, and fruit juice that has water added (diluted fruit juice).  Eat bland, easy-to-digest foods in small amounts as you are able. These foods include bananas, applesauce, rice, lean meats, toast, and crackers.  Avoid fluids that contain a lot of sugar or caffeine, such as energy drinks, sports drinks, and soda.  Avoid alcohol.  Avoid spicy or fatty foods.      General instructions  Take over-the-counter and prescription medicines only as told by your health care provider.  Drink enough fluid to keep your urine pale yellow.  Wash your hands often using soap and water. If soap and water are not available, use hand sanitizer.  Make sure that all people in your household wash their hands well and often.  Rest at home while you recover.  Watch your condition for any changes.  Breathe slowly and deeply when you feel nauseated.  Keep all follow-up visits as told by your health care provider. This is important. Contact a health care provider if:  Your symptoms get worse.  You have new symptoms.  You have a fever.  You cannot drink fluids without vomiting.  Your nausea does not go away after 2 days.  You feel light-headed or dizzy.  You have a headache.  You have muscle cramps.  You have a rash.  You have pain while urinating. Get help right away if:  You have pain in your chest, neck, arm, or jaw.  You feel extremely weak or you faint.  You have persistent vomiting.  You have vomit that is bright red or looks like black coffee grounds.  You have bloody or black stools or stools that look like tar.  You have a severe headache, a stiff neck, or both.  You have severe pain, cramping,  or bloating in your abdomen.  You have difficulty breathing, or you are breathing very quickly.  Your heart is beating very quickly.  Your skin feels cold and clammy.  You feel confused.  You have signs of dehydration, such as: ? Dark urine, very little urine, or no urine. ? Cracked lips. ? Dry mouth. ? Sunken eyes. ? Sleepiness. ? Weakness. These symptoms may represent a serious problem that is an emergency. Do not wait to see if the symptoms will go away. Get medical help right away. Call your local emergency services (911 in the [place] Do not drive yourself to the hospital. Summary  Nausea is the feeling that you have an upset stomach or that you are about to vomit. As nausea gets worse, it can lead to vomiting. Vomiting can make you feel weak and cause you to become dehydrated.  Follow instructions from your health care provider about eating and drinking to prevent dehydration.  Take over-the-counter and prescription medicines only as told by your health care provider.  Contact your health care provider if your symptoms get worse, or you  have new symptoms.  Keep all follow-up visits as told by your health care provider. This is important. This information is not intended to replace advice given to you by your health care provider. Make sure you discuss any questions you have with your health care provider. Document Revised: 03/14/2019 Document Reviewed: 04/30/2018 Elsevier Patient Education  2021 Elsevier Inc.  Nausea, Adult Nausea is the feeling that you have an upset stomach or that you are about to vomit. Nausea on its own is not usually a serious concern, but it may be an early sign of a more serious medical problem. As nausea gets worse, it can lead to vomiting. If vomiting develops, or if you are not able to drink enough fluids, you are at risk of becoming dehydrated. Dehydration can make you tired and thirsty, cause you to have a dry mouth, and decrease how often you  urinate. Older adults and people with other diseases or a weak disease-fighting system (immune system) are at higher risk for dehydration. The main goals of treating your nausea are:  To relieve your nausea.  To limit repeated nausea episodes.  To prevent vomiting and dehydration. Follow these instructions at home: Watch your symptoms for any changes. Tell your health care provider about them. Follow these instructions as told by your health care provider. Eating and drinking  Take an oral rehydration solution (ORS). This is a drink that is sold at pharmacies and retail stores.  Drink clear fluids slowly and in small amounts as you are able. Clear fluids include water, ice chips, low-calorie sports drinks, and fruit juice that has water added (diluted fruit juice).  Eat bland, easy-to-digest foods in small amounts as you are able. These foods include bananas, applesauce, rice, lean meats, toast, and crackers.  Avoid drinking fluids that contain a lot of sugar or caffeine, such as energy drinks, sports drinks, and soda.  Avoid alcohol.  Avoid spicy or fatty foods.      General instructions  Take over-the-counter and prescription medicines only as told by your health care provider.  Rest at home while you recover.  Drink enough fluid to keep your urine pale yellow.  Breathe slowly and deeply when you feel nauseous.  Avoid smelling things that have strong odors.  Wash your hands often using soap and water. If soap and water are not available, use hand sanitizer.  Make sure that all people in your household wash their hands well and often.  Keep all follow-up visits as told by your health care provider. This is important. Contact a health care provider if:  Your nausea gets worse.  Your nausea does not go away after two days.  You vomit.  You cannot drink fluids without vomiting.  You have any of the following: ? New symptoms. ? A fever. ? A headache. ? Muscle  cramps. ? A rash. ? Pain while urinating.  You feel light-headed or dizzy. Get help right away if:  You have pain in your chest, neck, arm, or jaw.  You feel extremely weak or you faint.  You have vomit that is bright red or looks like coffee grounds.  You have bloody or black stools or stools that look like tar.  You have a severe headache, a stiff neck, or both.  You have severe pain, cramping, or bloating in your abdomen.  You have difficulty breathing or are breathing very quickly.  Your heart is beating very quickly.  Your skin feels cold and clammy.  You feel confused.  You have signs of dehydration, such as: ? Dark urine, very little urine, or no urine. ? Cracked lips. ? Dry mouth. ? Sunken eyes. ? Sleepiness. ? Weakness. These symptoms may represent a serious problem that is an emergency. Do not wait to see if the symptoms will go away. Get medical help right away. Call your local emergency services (911 in the [place] Do not drive yourself to the hospital. Summary  Nausea is the feeling that you have an upset stomach or that you are about to vomit. Nausea on its own is not usually a serious concern, but it may be an early sign of a more serious medical problem.  If vomiting develops, or if you are not able to drink enough fluids, you are at risk of becoming dehydrated.  Follow recommendations for eating and drinking and take over-the-counter and prescription medicines only as told by your health care provider.  Contact a health care provider right away if your symptoms worsen or you have new symptoms.  Keep all follow-up visits as told by your health care provider. This is important. This information is not intended to replace advice given to you by your health care provider. Make sure you discuss any questions you have with your health care provider. Document Revised: 10/21/2019 Document Reviewed: 04/30/2018 Elsevier Patient Education  2021 Elsevier  Inc.  Constipation, Adult Constipation is when a person has fewer than three bowel movements in a week, has difficulty having a bowel movement, or has stools (feces) that are dry, hard, or larger than normal. Constipation may be caused by an underlying condition. It may become worse with age if a person takes certain medicines and does not take in enough fluids. Follow these instructions at home: Eating and drinking  Eat foods that have a lot of fiber, such as beans, whole grains, and fresh fruits and vegetables.  Limit foods that are low in fiber and high in fat and processed sugars, such as fried or sweet foods. These include french fries, hamburgers, cookies, candies, and soda.  Drink enough fluid to keep your urine pale yellow.   General instructions  Exercise regularly or as told by your health care provider. Try to do 150 minutes of moderate exercise each week.  Use the bathroom when you have the urge to go. Do not hold it in.  Take over-the-counter and prescription medicines only as told by your health care provider. This includes any fiber supplements.  During bowel movements: ? Practice deep breathing while relaxing the lower abdomen. ? Practice pelvic floor relaxation.  Watch your condition for any changes. Let your health care provider know about them.  Keep all follow-up visits as told by your health care provider. This is important. Contact a health care provider if:  You have pain that gets worse.  You have a fever.  You do not have a bowel movement after 4 days.  You vomit.  You are not hungry or you lose weight.  You are bleeding from the opening between the buttocks (anus).  You have thin, pencil-like stools. Get help right away if:  You have a fever and your symptoms suddenly get worse.  You leak stool or have blood in your stool.  Your abdomen is bloated.  You have severe pain in your abdomen.  You feel dizzy or you  faint. Summary  Constipation is when a person has fewer than three bowel movements in a week, has difficulty having a bowel movement, or has stools (feces) that  are dry, hard, or larger than normal.  Eat foods that have a lot of fiber, such as beans, whole grains, and fresh fruits and vegetables.  Drink enough fluid to keep your urine pale yellow.  Take over-the-counter and prescription medicines only as told by your health care provider. This includes any fiber supplements. This information is not intended to replace advice given to you by your health care provider. Make sure you discuss any questions you have with your health care provider. Document Revised: 10/08/2019 Document Reviewed: 10/08/2019 Elsevier Patient Education  2021 [employer].

## 2021-03-03 NOTE — Telephone Encounter (Signed)
Appointment has been scheduled.    [facility] Night - Cl TELEPHONE ADVICE RECORD Access[facility]urse Patient Name: Kim Reeves [facility] Gender: Female DOB: 1991/02/03 Age: 30 Y 9 M 12 D Return Phone Number: [phone] (Primary) Address: City/ State/ Zip: [place]  [address] Client [facility] Night - Cl Client Site [facility] - Night Physician Rennie Plowman- NP Contact Type Call Who Is Calling Patient / Member / Family / Caregiver Call Type Triage / Clinical Relationship To Patient Self Return Phone Number [phone] (Primary) Chief Complaint VOMITING - and no urine output in 8 hours Reason for Call Symptomatic / Request for Health Information Initial Comment Caller's tongue is black in color since she woke up this morning. Caller reports she has been experiencing chronic nausea, and vomiting. Caller is urinating in small amounts. Translation No Nurse Assessment Nurse: Mayford Knife, RN, Lupe Carney Date/Time Lamount Cohen Time): 03/03/2021 7:38:38 AM Confirm and document reason for call. If symptomatic, describe symptoms. ---Has been vomiting off and since thursday. Having small amounts of urine. Last urine was this morning. No fever. two negative pregnancy tests. Does the patient have any new or worsening symptoms? ---Yes Will a triage be completed? ---Yes Related visit to physician within the last 2 weeks? ---No Does the PT have any chronic conditions? (i.e. diabetes, asthma, this includes High risk factors for pregnancy, etc.) ---Yes List chronic conditions. ---HTN Is the patient pregnant or possibly pregnant? (Ask all females between the ages of 55-55) ---No Is this a behavioral health or substance abuse call? ---No Guidelines Guideline Title Affirmed Question Affirmed Notes Nurse Date/Time (Eastern Time) Vomiting [1] MILD or MODERATE vomiting AND [2] present > 48 hours Turner, RN,  Rebekah 03/03/2021 7:41:06 AM PLEASE NOTE: All timestamps contained within this report are represented as [place] Standard Time. CONFIDENTIALTY NOTICE: This fax transmission is intended only for the addressee. It contains information that is legally privileged, confidential or otherwise protected from use or disclosure. If you are not the intended recipient, you are strictly prohibited from reviewing, disclosing, copying using or disseminating any of this information or taking any action in reliance on or regarding this information. If you have received this fax in error, please notify us immediately by telephone so that we can arrange for its return to [place]. Phone: [phone], Toll-Free: [phone], Fax: [phone] Page: 2 of 2 Call Id: [id number] Guidelines Guideline Title Affirmed Question Affirmed Notes Nurse Date/Time Lamount Cohen Time) (2 days) (Exception: mild vomiting with associated diarrhea) Disp. Time Lamount Cohen Time) Disposition Final User 03/03/2021 7:34:49 AM Send to Urgent Queue Simon Rhein 03/03/2021 7:45:42 AM See PCP within 24 Hours Yes Mayford Knife, RN, Lupe Carney Caller Disagree/Comply Comply Caller Understands Yes PreDisposition Call Doctor Care Advice Given Per Guideline SEE PCP WITHIN 24 HOURS: * IF OFFICE WILL BE OPEN: You need to be examined within the next 24 hours. Call your doctor (or NP/PA) when the office opens and make an appointment. CLEAR LIQUIDS: * Try to sip small amounts (1 tablespoon or 15 ml) of liquid frequently (every 5 minutes) for 8 hours, rather than trying to drink a lot of liquid all at one time. * Sip water or a 1/2 strength sports drink (e.g., Gatorade or Powerade). * Other options: 1/2 strength flat lemon-lime soda or ginger ale. SOLID FOOD: * You may begin eating bland foods after 8 hours without vomiting. CALL BACK IF: * Vomit contains bile (green color) * Constant stomach pain lasting more than 2 hours * Signs of dehydration (  e.g., no urination over  12 hours, very lightheaded) * You become worse CARE ADVICE per Vomiting (Adult) guideline. Referrals REFERRED TO PCP OFFICE

## 2021-03-03 NOTE — Telephone Encounter (Signed)
Call pt   She needs an appt to be seen for assess urination, dehydration status , either with [place] today or she will need to evaluated at urgent care.  Please advise patient

## 2021-03-03 NOTE — Telephone Encounter (Signed)
Pt actually had VV today with Dr. French Ana.

## 2021-03-03 NOTE — Progress Notes (Signed)
Virtual Visit via Video Note  I connected with Kim Reeves  on 03/03/21 at  2:00 PM EDT by a video enabled telemedicine application and verified that I am speaking with the correct person using two identifiers.  Location patient: work [employer] or home office Persons participating in the virtual visit: patient, provider  I discussed the limitations of evaluation and management by telemedicine and the availability of in person appointments. The patient expressed understanding and agreed to proceed.   HPI: N/v since last Thursday 02/24/21 urine preg test otc neg x 2 lmp was 02/23/21 and she was on cycle when had nausea/vomitting. C/o reduced appetite she always has fatigue denies cough body aches. She has chronic constipation and has not had bowel movement since 02/24/21. Also she gets venofer iron infusions but not had since the end of 01/2021. Nausea/vomiting started Thursday 02/24/21 after eating a big salad weds every time she eats foods n/v, denies dysria/ha she does have abdominal cramps, +constipatoin, no fever tried gingerale, body armor, peptobismol but stopped this as she woke up and her tongue was black. Last Thursday she vomited x 1, Friday vomited x 1, sat vomited x 1 did not vomit Sunday, Monday, Tuesday or weds but vomited today   No sick contacts she knows of wearing a mask   ROS: See pertinent positives and negatives per HPI.  Past Medical History:  Diagnosis Date  . Anemia   . Chlamydia   . Family history of breast cancer    12/20 cancer genetic tesing letter sent  . Family history of breast cancer   . Family history of colon cancer   . Family history of lung cancer   . Family history of pancreatic cancer   . Family history of throat cancer   . GAD (generalized anxiety disorder)   . Gonorrhea   . Hypertension   . 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 NO PAST SURGERIES       Current Outpatient Medications:  .  docusate sodium (COLACE) 100 MG capsule, Take 1 capsule (100 mg total) by mouth 2 (two) times daily as needed for mild constipation., Disp: 30 capsule, Rfl: 0 .  hydrochlorothiazide (MICROZIDE) 12.5 MG capsule, TAKE 1 CAPSULE(12.5 MG) BY MOUTH DAILY, Disp: 90 capsule, Rfl: 0 .  meloxicam (MOBIC) 7.5 MG tablet, Take 1 tablet (7.5 mg total) by mouth daily as needed for pain., Disp: 30 tablet, Rfl: 1 .  ondansetron (ZOFRAN) 4 MG tablet, Take 1 tablet (4 mg total) by mouth every 8 (eight) hours as needed for nausea or vomiting., Disp: 40 tablet, Rfl: 1 .  polyethylene glycol (MIRALAX) 17 g packet, Take 17 g by mouth daily as needed., Disp: 30 each, Rfl: 0  EXAM:  VITALS per patient if applicable:  GENERAL: alert, oriented, appears well and in no acute distress  HEENT: atraumatic, conjunttiva clear, no obvious abnormalities on inspection of external nose and ears  NECK: normal movements of the head and neck  LUNGS: on inspection no signs of respiratory distress, breathing rate appears normal, no obvious gross SOB, gasping or wheezing  CV: no obvious cyanosis  MS: moves all visible extremities without noticeable abnormality  PSYCH/NEURO: pleasant and cooperative, no obvious depression or anxiety, speech and thought processing grossly intact  ASSESSMENT AND PLAN:  Discussed the following assessment and plan:  Nausea and vomiting, intractability of vomiting not specified, unspecified vomiting type  could be due to constipation- Plan: ondansetron (ZOFRAN) 4 MG tablet Brat diet  Consider US abdomen/abdominal Xray in future to further w/u  Neg home preg test x 2  Will go today and get covid 19 tested and let us know   Constipation, unspecified constipation type - Plan: polyethylene glycol (MIRALAX) 17 g packet, docusate sodium (COLACE) 100 MG capsule  -we discussed possible serious and likely etiologies,  options for evaluation and workup, limitations of telemedicine visit vs in person visit, treatment, treatment risks and precautions.     I discussed the assessment and treatment plan with the patient. The patient was provided an opportunity to ask questions and all were answered. The patient agreed with the plan and demonstrated an understanding of the instructions.    Time spent 20 min Bevelyn Buckles, MD

## 2021-03-28 ENCOUNTER — Ambulatory Visit: Payer: Managed Care, Other (non HMO) | Admitting: Family

## 2021-03-28 ENCOUNTER — Other Ambulatory Visit: Payer: Self-pay

## 2021-03-28 ENCOUNTER — Encounter: Payer: Self-pay | Admitting: Family

## 2021-03-28 VITALS — BP 116/80 | HR 74 | Temp 97.9°F | Ht 68.0 in | Wt 183.4 lb

## 2021-03-28 DIAGNOSIS — I10 Essential (primary) hypertension: Secondary | ICD-10-CM | POA: Diagnosis not present

## 2021-03-28 DIAGNOSIS — K5900 Constipation, unspecified: Secondary | ICD-10-CM | POA: Insufficient documentation

## 2021-03-28 DIAGNOSIS — R5383 Other fatigue: Secondary | ICD-10-CM

## 2021-03-28 NOTE — Patient Instructions (Signed)
Referral to neurology Let us know if you dont hear back within a week in regards to an appointment being scheduled.

## 2021-03-28 NOTE — Assessment & Plan Note (Signed)
Excellent control. Continue hctz 12.5mg 

## 2021-03-28 NOTE — Assessment & Plan Note (Addendum)
Uncontrolled. Discussed anemia, lack of exercise, sleep hygiene, stress from school and upcoming graduation. Non restorative sleep and we discussed evaluation for sleep apnea . Neurology referral placed.

## 2021-03-28 NOTE — Progress Notes (Signed)
Subjective:    Patient ID: Kim Reeves, female    DOB: 08-06-1991, 30 y.o.   MRN: [id number]  CC: Kim Reeves is a 30 y.o. female who presents today for follow up.   HPI: Complains of continued fatigue. She hasnt  felt any improvement with IV venofer 2 months ago.  Pulling 'all nighter' this week to study as she in final exam week. she graduates next week. Work adds to this stress. No formal exercise.   She is able to fall asleep easily and even when does sleep 6-8 hours, she continues to feel fatigue. Sleep is not restorative. She snores.   HA has resolved with blood pressure control.   Compliant with hctz 12.5mg . No cp, sob  Menses are monthly. LMP 7 days ago. She is not on birth control. Cycles have become less heavy.    Chronic constipation and normal for her to go once per week.  She has been this way 'her whole life.' No weight loss, blood in stool, abdominal pain, vomiting.  Constipation improves exercise, and using miralax once per week.       HISTORY:  Past Medical History:  Diagnosis Date  . Anemia   . Chlamydia   . Family history of breast cancer    12/20 cancer genetic tesing letter sent  . Family history of breast cancer   . Family history of colon cancer   . Family history of lung cancer   . Family history of pancreatic cancer   . Family history of throat cancer   . GAD (generalized anxiety disorder)   . Gonorrhea   . Hypertension   . 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 NO PAST SURGERIES     Family History  Problem Relation Age of Onset  . Colon cancer Maternal Grandmother 60  . Breast cancer Maternal Grandmother 19  . Breast cancer Paternal Aunt 40  . Colon cancer Maternal Aunt 52  . Pancreatic cancer Maternal Aunt   . Breast cancer Cousin 41    Allergies: Patient has no known allergies. Current Outpatient Medications on File Prior  to Visit  Medication Sig Dispense Refill  . hydrochlorothiazide (MICROZIDE) 12.5 MG capsule TAKE 1 CAPSULE(12.5 MG) BY MOUTH DAILY 90 capsule 0  . meloxicam (MOBIC) 7.5 MG tablet Take 1 tablet (7.5 mg total) by mouth daily as needed for pain. 30 tablet 1  . polyethylene glycol (MIRALAX) 17 g packet Take 17 g by mouth daily as needed. 30 each 0  . docusate sodium (COLACE) 100 MG capsule Take 1 capsule (100 mg total) by mouth 2 (two) times daily as needed for mild constipation. (Patient not taking: Reported on 03/28/2021) 30 capsule 0  . ondansetron (ZOFRAN) 4 MG tablet Take 1 tablet (4 mg total) by mouth every 8 (eight) hours as needed for nausea or vomiting. (Patient not taking: Reported on 03/28/2021) 40 tablet 1   No current facility-administered medications on file prior to visit.    Social History   Tobacco Use  . Smoking status: Never Smoker  . Smokeless tobacco: Never Used  Vaping Use  . Vaping Use: Never used  Substance Use Topics  . Alcohol use: Not Currently    Comment: socially  . Drug use: Yes    Types: Other-see comments    Comment: Etables     Review of Systems  Constitutional: Positive for fatigue. Negative for chills and fever.  Respiratory: Negative for cough.   Cardiovascular: Negative for chest pain and palpitations.  Gastrointestinal: Positive for constipation. Negative for abdominal pain, blood in stool, diarrhea, nausea and vomiting.  Genitourinary: Negative for menstrual problem and vaginal bleeding.      Objective:    BP 116/80   Pulse 74   Temp 97.9 F (36.6 C)   Ht 5\' 8"  (1.727 m)   Wt 183 lb 6.4 oz (83.2 kg)   SpO2 98%   BMI 27.89 kg/m  BP Readings from Last 3 Encounters:  03/28/21 116/80  02/10/21 133/87  01/31/21 (!) 132/92   Wt Readings from Last 3 Encounters:  03/28/21 183 lb 6.4 oz (83.2 kg)  03/03/21 184 lb (83.5 kg)  01/11/21 184 lb 6.4 oz (83.6 kg)    Physical Exam Vitals reviewed.  Constitutional:      Appearance: She is  well-developed.  Eyes:     Conjunctiva/sclera: Conjunctivae normal.  Cardiovascular:     Rate and Rhythm: Normal rate and regular rhythm.     Pulses: Normal pulses.     Heart sounds: Normal heart sounds.  Pulmonary:     Effort: Pulmonary effort is normal.     Breath sounds: Normal breath sounds. No wheezing, rhonchi or rales.  Skin:    General: Skin is warm and dry.  Neurological:     Mental Status: She is alert.  Psychiatric:        Speech: Speech normal.        Behavior: Behavior normal.        Thought Content: Thought content normal.        Assessment & Plan:   Problem List Items Addressed This Visit      Cardiovascular and Mediastinum   HTN (hypertension)    Excellent control. Continue hctz 12.5mg         Other   Constipation    Uncontrolled. Chronic. Pending stool cards, celiac screen. Discussed increased exercise and use of colace/ miralax to have bowel movement every day. Advised GI consult and she politely declines at this time.      Fatigue - Primary    Uncontrolled. Discussed anemia, lack of exercise, sleep hygiene, stress from school and upcoming graduation. Non restorative sleep and we discussed evaluation for sleep apnea . Neurology referral placed.        Relevant Orders   Celiac Disease Ab Screen w/Rfx   Fecal occult blood, imunochemical   Ambulatory referral to Neurology       I am having 03/11/21 maintain her meloxicam, hydrochlorothiazide, ondansetron, polyethylene glycol, and docusate sodium.   No orders of the defined types were placed in this encounter.   Return precautions given.   Risks, benefits, and alternatives of the medications and treatment plan prescribed today were discussed, and patient expressed understanding.   Education regarding symptom management and diagnosis given to patient on AVS.  Continue to follow with [name], FNP for routine health maintenance.   [name] and I agreed with plan.   [name], FNP

## 2021-03-28 NOTE — Assessment & Plan Note (Addendum)
Uncontrolled. Chronic. Pending stool cards, celiac screen. Discussed increased exercise and use of colace/ miralax to have bowel movement every day. Advised GI consult and she politely declines at this time.

## 2021-03-29 LAB — CELIAC DISEASE AB SCREEN W/RFX
Antigliadin Abs, IgA: 4 units (ref 0–19)
IgA/Immunoglobulin A, Serum: 288 mg/dL (ref 87–352)
Transglutaminase IgA: <2 U/mL (ref 0–3)

## 2021-04-01 ENCOUNTER — Other Ambulatory Visit (HOSPITAL_COMMUNITY)
Admission: RE | Admit: 2021-04-01 | Discharge: 2021-04-01 | Disposition: A | Discharge status: Home / Self Care | Payer: Managed Care, Other (non HMO) | Source: Ambulatory Visit | Attending: Obstetrics and Gynecology | Admitting: Obstetrics and Gynecology

## 2021-04-01 ENCOUNTER — Other Ambulatory Visit (INDEPENDENT_AMBULATORY_CARE_PROVIDER_SITE_OTHER): Payer: Managed Care, Other (non HMO)

## 2021-04-01 ENCOUNTER — Other Ambulatory Visit: Payer: Self-pay

## 2021-04-01 ENCOUNTER — Ambulatory Visit: Payer: Managed Care, Other (non HMO) | Admitting: Obstetrics and Gynecology

## 2021-04-01 ENCOUNTER — Encounter: Payer: Self-pay | Admitting: Obstetrics and Gynecology

## 2021-04-01 VITALS — BP 126/82 | Ht 67.5 in | Wt 183.0 lb

## 2021-04-01 DIAGNOSIS — Z113 Encounter for screening for infections with a predominantly sexual mode of transmission: Secondary | ICD-10-CM

## 2021-04-01 DIAGNOSIS — R5383 Other fatigue: Secondary | ICD-10-CM | POA: Diagnosis not present

## 2021-04-01 DIAGNOSIS — N898 Other specified noninflammatory disorders of vagina: Secondary | ICD-10-CM

## 2021-04-01 LAB — FECAL OCCULT BLOOD, IMMUNOCHEMICAL: Fecal Occult Bld: NEGATIVE

## 2021-04-01 NOTE — Progress Notes (Signed)
Gynecology STD Evaluation   Chief Complaint:  Chief Complaint  Patient presents with  . Vaginal Discharge    Discharge and odor, want STI testing full panel. RM 5    History of Present Illness: Patient is a 30 y.o. G1P0010 presents for STD testing.  The patient has not noted intermenstrual spotting,  has not experienced postcoital bleeding, and does report increased vaginal discharge.  There is a history of prior sexually transmitted infection(s).     The patient is sexually active. She currently uses NFP (monitoring ovulation) for contraception. The patient also relies on condoms to prevent the spread of sexually transmitted infections.   PMHx: She  has a past medical history of Anemia, Chlamydia, Family history of breast cancer, Family history of breast cancer, Family history of colon cancer, Family history of lung cancer, Family history of pancreatic cancer, Family history of throat cancer, GAD (generalized anxiety disorder), Gonorrhea, Hypertension, and Major depressive disorder, single episode, severe without psychotic features (HCC) (01/25/2016). Also,  has a past surgical history that includes No past surgeries., family history includes Breast cancer (age of onset: 100) in her paternal aunt; Breast cancer (age of onset: 51) in her cousin; Breast cancer (age of onset: 107) in her maternal grandmother; Colon cancer (age of onset: 35) in her maternal aunt; Colon cancer (age of onset: 78) in her maternal grandmother; Pancreatic cancer in her maternal aunt.,  reports that she has never smoked. She has never used smokeless tobacco. She reports previous alcohol use. She reports current drug use. Drug: Other-see comments.  She has a current medication list which includes the following prescription(s): hydrochlorothiazide, meloxicam, polyethylene glycol, docusate sodium, and ondansetron. Also, has No Known Allergies.  Review of Systems  Constitutional: Negative.   HENT: Negative.   Eyes: Negative.    Respiratory: Negative.   Cardiovascular: Negative.   Gastrointestinal: Negative.   Genitourinary:       Vaginal discharge Vaginal odor  Musculoskeletal: Negative.   Skin: Negative.   Neurological: Negative.   Endo/Heme/Allergies: Negative.   Psychiatric/Behavioral: Negative.     Objective: BP 126/82   Ht 5' 7.5" (1.715 m)   Wt 183 lb (83 kg)   LMP 03/22/2021   BMI 28.24 kg/m  Physical Exam Vitals reviewed.  Constitutional:      Appearance: She is well-developed. NAD. Pulmonary:     Effort: Pulmonary effort is normal.  Genitourinary:    General: Normal vulva.     Pubic Area: No rash.      Labia:        Right: No rash, tenderness or lesion.        Left: No rash, tenderness or lesion.      Vagina: Normal. Thick white to yellow discharge noted. No erythema or tenderness.     Cervix: Normal.     Uterus: Normal. Not enlarged and not tender.      Adnexa: Right adnexa normal and left adnexa normal.       Right: No mass or tenderness.         Left: No mass or tenderness.    Musculoskeletal:        General: Normal range of motion.     Cervical back: Normal range of motion.  Skin:    General: Skin is warm and dry.  Neurological:     General: No focal deficit present.     Mental Status: She is alert and oriented to person, place, and time.  Psychiatric:  Mood and Affect: Mood normal.        Behavior: Behavior normal.        Thought Content: Thought content normal.        Judgment: Judgment normal  Assessment: 30 y.o. G1P0010 with increased vaginal discharge and odor. Requesting screening for STIs today.  Plan:    Visit Diagnoses    Vaginal discharge    -  Primary   Relevant Orders   Cervicovaginal ancillary only   Screen for sexually transmitted diseases       Relevant Orders   HIV Antibody (routine testing w rflx)   RPR Qual   Hepatitis C antibody   Vaginal odor         1) Contraception - Education given regarding options for contraception, including  NFP. 2) STI screening was offered and completed.  Will f/u with lab results.   Zipporah Plants, CNM [facility], [facility] 04/01/2021 4:20 PM

## 2021-04-02 LAB — HIV ANTIBODY (ROUTINE TESTING W REFLEX): HIV Screen 4th Generation wRfx: NONREACTIVE

## 2021-04-02 LAB — HEPATITIS C ANTIBODY: Hep C Virus Ab: 0.1 s/co ratio (ref 0.0–0.9)

## 2021-04-02 LAB — RPR QUALITATIVE: RPR Ser Ql: NONREACTIVE

## 2021-04-05 ENCOUNTER — Other Ambulatory Visit: Payer: Self-pay | Admitting: Obstetrics and Gynecology

## 2021-04-05 DIAGNOSIS — N760 Acute vaginitis: Secondary | ICD-10-CM

## 2021-04-05 DIAGNOSIS — B9689 Other specified bacterial agents as the cause of diseases classified elsewhere: Secondary | ICD-10-CM

## 2021-04-05 LAB — CERVICOVAGINAL ANCILLARY ONLY
Bacterial Vaginitis (gardnerella): POSITIVE — AB
Candida Glabrata: NEGATIVE
Candida Vaginitis: NEGATIVE
Chlamydia: NEGATIVE
Comment: NEGATIVE
Comment: NEGATIVE
Comment: NEGATIVE
Comment: NEGATIVE
Comment: NEGATIVE
Comment: NORMAL
Neisseria Gonorrhea: NEGATIVE
Trichomonas: NEGATIVE

## 2021-04-05 MED ORDER — METRONIDAZOLE 0.75 % VA GEL: 1.0000 | Freq: Every day | VAGINAL | 0 refills | Status: AC

## 2021-04-22 ENCOUNTER — Inpatient Hospital Stay: Payer: Managed Care, Other (non HMO) | Attending: Oncology

## 2021-04-22 ENCOUNTER — Other Ambulatory Visit: Payer: Self-pay

## 2021-04-22 DIAGNOSIS — D509 Iron deficiency anemia, unspecified: Secondary | ICD-10-CM | POA: Insufficient documentation

## 2021-04-22 DIAGNOSIS — D500 Iron deficiency anemia secondary to blood loss (chronic): Secondary | ICD-10-CM

## 2021-04-22 LAB — IRON AND TIBC
Iron: 109 ug/dL (ref 28–170)
Saturation Ratios: 31 % (ref 10.4–31.8)
TIBC: 349 ug/dL (ref 250–450)
UIBC: 240 ug/dL

## 2021-04-22 LAB — CBC WITH DIFFERENTIAL/PLATELET
Abs Immature Granulocytes: 0.02 10*3/uL (ref 0.00–0.07)
Basophils Absolute: 0 10*3/uL (ref 0.0–0.1)
Basophils Relative: 0 %
Eosinophils Absolute: 0.1 10*3/uL (ref 0.0–0.5)
Eosinophils Relative: 1 %
HCT: 35.5 % — ABNORMAL LOW (ref 36.0–46.0)
Hemoglobin: 11.2 g/dL — ABNORMAL LOW (ref 12.0–15.0)
Immature Granulocytes: 0 %
Lymphocytes Relative: 40 %
Lymphs Abs: 3.6 10*3/uL (ref 0.7–4.0)
MCH: 23 pg — ABNORMAL LOW (ref 26.0–34.0)
MCHC: 31.5 g/dL (ref 30.0–36.0)
MCV: 72.7 fL — ABNORMAL LOW (ref 80.0–100.0)
Monocytes Absolute: 0.7 10*3/uL (ref 0.1–1.0)
Monocytes Relative: 8 %
Neutro Abs: 4.6 10*3/uL (ref 1.7–7.7)
Neutrophils Relative %: 51 %
Platelets: 349 10*3/uL (ref 150–400)
RBC: 4.88 MIL/uL (ref 3.87–5.11)
RDW: 14.7 % (ref 11.5–15.5)
WBC: 9.1 10*3/uL (ref 4.0–10.5)
nRBC: 0 % (ref 0.0–0.2)

## 2021-04-22 LAB — FERRITIN: Ferritin: 36 ng/mL (ref 11–307)

## 2021-04-25 ENCOUNTER — Inpatient Hospital Stay (HOSPITAL_BASED_OUTPATIENT_CLINIC_OR_DEPARTMENT_OTHER): Payer: Managed Care, Other (non HMO) | Admitting: Oncology

## 2021-04-25 ENCOUNTER — Encounter: Payer: Self-pay | Admitting: Oncology

## 2021-04-25 DIAGNOSIS — D500 Iron deficiency anemia secondary to blood loss (chronic): Secondary | ICD-10-CM

## 2021-04-25 DIAGNOSIS — D509 Iron deficiency anemia, unspecified: Secondary | ICD-10-CM | POA: Diagnosis not present

## 2021-04-25 DIAGNOSIS — Z803 Family history of malignant neoplasm of breast: Secondary | ICD-10-CM | POA: Diagnosis not present

## 2021-04-25 NOTE — Progress Notes (Signed)
Patient reports improvement in her fatigue.  Patient verified using two identifiers for virtual visit via telephone today.

## 2021-04-25 NOTE — Progress Notes (Signed)
HEMATOLOGY-ONCOLOGY TeleHEALTH VISIT PROGRESS NOTE  I connected with [name] on 04/25/21 at  2:00 PM EDT by video enabled telemedicine visit and verified that I am speaking with the correct person using two identifiers. I discussed the limitations, risks, security and privacy concerns of performing an evaluation and management service by telemedicine and the availability of in-person appointments. I also discussed with the patient that there may be a patient responsible charge related to this service. The patient expressed understanding and agreed to proceed.   Other persons participating in the visit and their role in the encounter:  None  Patient's location: in her car- not driving Provider's location: office Chief Complaint: follow up for anemia.    INTERVAL HISTORY Cing Ruby is a 30 y.o. female who has above history reviewed by me today presents for follow up visit for management of anemia, thalassemia minor.  Problems and complaints are listed below:  Patient reports feeling a lot better.  Fatigue level has improved.  No new complaints.  Review of Systems  Constitutional: Negative for appetite change, chills and fever.  HENT:   Negative for hearing loss and voice change.   Eyes: Negative for eye problems.  Respiratory: Negative for chest tightness and cough.   Cardiovascular: Negative for chest pain.  Gastrointestinal: Negative for abdominal distention, abdominal pain and blood in stool.  Endocrine: Negative for hot flashes.  Genitourinary: Negative for difficulty urinating and frequency.   Musculoskeletal: Negative for arthralgias.  Skin: Negative for itching and rash.  Neurological: Negative for extremity weakness.  Hematological: Negative for adenopathy.  Psychiatric/Behavioral: Negative for confusion.    Past Medical History:  Diagnosis Date  . Anemia   . Chlamydia   . Family history of breast cancer    12/20 cancer genetic tesing letter sent  . Family history of  breast cancer   . Family history of colon cancer   . Family history of lung cancer   . Family history of pancreatic cancer   . Family history of throat cancer   . GAD (generalized anxiety disorder)   . Gonorrhea   . Hypertension   . 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 NO PAST SURGERIES      Family History  Problem Relation Age of Onset  . Colon cancer Maternal Grandmother 60  . Breast cancer Maternal Grandmother 78  . Breast cancer Paternal Aunt 40  . Colon cancer Maternal Aunt 52  . Pancreatic cancer Maternal Aunt   . Breast cancer Cousin 79    Social History   Socioeconomic History  . Marital status: Single    Spouse name: Not on file  . Number of children: 0  . Years of education: Not on file  . Highest education level: Not on file  Occupational History  . Not on file  Tobacco Use  . Smoking status: Never Smoker  . Smokeless tobacco: Never Used  Vaping Use  . Vaping Use: Never used  Substance and Sexual Activity  . Alcohol use: Not Currently    Comment: socially  . Drug use: Yes    Types: Other-see comments    Comment: Etables   . Sexual activity: Yes    Birth control/protection: Condom  Other Topics Concern  . Not on file  Social History Narrative   Originally from [place] to apply to [employer] for [employer] studies  Works as Air cabin crew      Recent break up 10/2016      Social Determinants of Corporate investment banker Strain: Not on file  Food Insecurity: Not on file  Transportation Needs: Not on file  Physical Activity: Not on file  Stress: Not on file  Social Connections: Not on file  Intimate Partner Violence: Not on file    Current Outpatient Medications on File Prior to Visit  Medication Sig Dispense Refill  . hydrochlorothiazide (MICROZIDE) 12.5 MG capsule TAKE 1 CAPSULE(12.5 MG) BY MOUTH DAILY 90 capsule 0  .  meloxicam (MOBIC) 7.5 MG tablet Take 1 tablet (7.5 mg total) by mouth daily as needed for pain. 30 tablet 1  . polyethylene glycol (MIRALAX) 17 g packet Take 17 g by mouth daily as needed. 30 each 0  . docusate sodium (COLACE) 100 MG capsule Take 1 capsule (100 mg total) by mouth 2 (two) times daily as needed for mild constipation. (Patient not taking: No sig reported) 30 capsule 0  . ondansetron (ZOFRAN) 4 MG tablet Take 1 tablet (4 mg total) by mouth every 8 (eight) hours as needed for nausea or vomiting. (Patient not taking: No sig reported) 40 tablet 1   No current facility-administered medications on file prior to visit.    No Known Allergies     Observations/Objective: Today's Vitals   04/25/21 1315  PainSc: 0-No pain   There is no height or weight on file to calculate BMI.  Physical Exam Constitutional:      General: She is not in acute distress. Neurological:     Mental Status: She is alert.     CBC    Component Value Date/Time   WBC 9.1 04/22/2021 1300   RBC 4.88 04/22/2021 1300   HGB 11.2 (L) 04/22/2021 1300   HGB 11.3 12/31/2020 1614   HCT 35.5 (L) 04/22/2021 1300   HCT 35.5 12/31/2020 1614   PLT 349 04/22/2021 1300   PLT 386 12/31/2020 1614   MCV 72.7 (L) 04/22/2021 1300   MCV 72 (L) 12/31/2020 1614   MCH 23.0 (L) 04/22/2021 1300   MCHC 31.5 04/22/2021 1300   RDW 14.7 04/22/2021 1300   RDW 14.7 12/31/2020 1614   LYMPHSABS 3.6 04/22/2021 1300   LYMPHSABS 3.9 (H) 12/31/2020 1614   MONOABS 0.7 04/22/2021 1300   EOSABS 0.1 04/22/2021 1300   EOSABS 0.1 12/31/2020 1614   BASOSABS 0.0 04/22/2021 1300   BASOSABS 0.0 12/31/2020 1614    CMP     Component Value Date/Time   NA 136 01/11/2021 1041   NA 140 10/27/2020 1114   K 4.0 01/11/2021 1041   CL 102 01/11/2021 1041   CO2 26 01/11/2021 1041   GLUCOSE 105 (H) 01/11/2021 1041   BUN 10 01/11/2021 1041   BUN 10 10/27/2020 1114   CREATININE 0.92 01/11/2021 1041   CALCIUM 8.6 (L) 01/11/2021 1041   PROT 6.9  01/11/2021 1041   PROT 7.3 06/25/2019 1609   ALBUMIN 3.7 01/11/2021 1041   ALBUMIN 4.3 06/25/2019 1609   AST 29 01/11/2021 1041   ALT 19 01/11/2021 1041   ALKPHOS 46 01/11/2021 1041   BILITOT 0.6 01/11/2021 1041   BILITOT 0.3 06/25/2019 1609   GFRNONAA >60 01/11/2021 1041   GFRAA 109 10/27/2020 1114     Assessment and Plan: 1. Iron deficiency anemia due to chronic blood loss   2. Microcytic anemia   3. Family history of breast cancer     #Iron deficiency anemia, Labs  reviewed and discussed with patient Hemoglobin has remained stable and mildly decreased at 11.2. Iron panel has improved with ferritin level 36, iron saturation 31 Hold off additional IV Venofer treatment at this point Patient cannot tolerate oral.  Recommend patient is to try over-the-counter multivitamin  #Family history of cancer  Patient has established with genetic counselor and had a genetic testing done. 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    Follow Up Instructions: 6 months  I discussed the assessment and treatment plan with the patient. The patient was provided an opportunity to ask questions and all were answered. The patient agreed with the plan and demonstrated an understanding of the instructions.  The patient was advised to call back or seek an in-person evaluation if the symptoms worsen or if the condition fails to improve as anticipated.    Rickard Patience, MD 04/25/2021 9:42 PM

## 2021-04-26 ENCOUNTER — Inpatient Hospital Stay: Payer: Managed Care, Other (non HMO)

## 2021-05-19 ENCOUNTER — Telehealth: Payer: Self-pay

## 2021-05-19 NOTE — Telephone Encounter (Signed)
Noted, thank you

## 2021-05-19 NOTE — Telephone Encounter (Signed)
I called pt per Dr. Johny Sax request in regards to sleep consult appt scheduled for 05/23/21.   Incoming referrals received sleep consult request from PCP. Pt was seen back in 2020 for a sleep consult with Dr. Frances Furbish but declined scheduling of the sleep study at the time and advised sleep tech she would call back to reschedule.( See telephone note from 08/12/19) Pt did not pursue call back at that time.  Per Dr. Frances Furbish she will be happy to see the pt again for a follow up appt since it has not been 3 years since her last visit, but wanted the pt to be aware prior to the appointment that pursuing sleep studies would still be recommended.  I called pt and advised of this, she verbalized understanding and sts she would probably like to pursuing testing at this time, as long as her insurance would cover.  Pt did ask to move the appt though because of a conflict on Monday and appt has been rescheduled for 07/19/21 at 300 pm

## 2021-05-20 NOTE — Telephone Encounter (Signed)
noted 

## 2021-05-23 ENCOUNTER — Institutional Professional Consult (permissible substitution): Payer: Managed Care, Other (non HMO) | Admitting: Neurology

## 2021-06-27 ENCOUNTER — Encounter: Payer: Self-pay | Admitting: Family

## 2021-06-27 ENCOUNTER — Other Ambulatory Visit: Payer: Self-pay

## 2021-06-27 ENCOUNTER — Other Ambulatory Visit (HOSPITAL_COMMUNITY)
Admission: RE | Admit: 2021-06-27 | Discharge: 2021-06-27 | Disposition: A | Discharge status: Home / Self Care | Payer: Managed Care, Other (non HMO) | Source: Ambulatory Visit | Attending: Family | Admitting: Family

## 2021-06-27 ENCOUNTER — Ambulatory Visit: Payer: Managed Care, Other (non HMO) | Admitting: Family

## 2021-06-27 VITALS — BP 112/64 | HR 73 | Temp 98.5°F | Ht 67.5 in | Wt 182.6 lb

## 2021-06-27 DIAGNOSIS — I10 Essential (primary) hypertension: Secondary | ICD-10-CM | POA: Diagnosis not present

## 2021-06-27 DIAGNOSIS — R5383 Other fatigue: Secondary | ICD-10-CM

## 2021-06-27 DIAGNOSIS — N760 Acute vaginitis: Secondary | ICD-10-CM | POA: Insufficient documentation

## 2021-06-27 DIAGNOSIS — B9689 Other specified bacterial agents as the cause of diseases classified elsewhere: Secondary | ICD-10-CM | POA: Diagnosis present

## 2021-06-27 MED ORDER — METRONIDAZOLE 500 MG PO TABS: 500.0000 mg | ORAL_TABLET | Freq: Two times a day (BID) | ORAL | 0 refills | Status: DC

## 2021-06-27 MED ORDER — HYDROCHLOROTHIAZIDE 12.5 MG PO CAPS: 12.5000 mg | ORAL_CAPSULE | Freq: Every day | ORAL | 1 refills | Status: DC | PRN

## 2021-06-27 NOTE — Assessment & Plan Note (Addendum)
Recurrent BV . She will self swab for formal diagnosis.  Start p.o. Flagyl.  She understands the importance of abstaining from alcohol completely until she is finished antibiotic course.  Encouraged probiotics to hopefully curtail recurrence.

## 2021-06-27 NOTE — Progress Notes (Signed)
Subjective:    Patient ID: Kim Reeves, female    DOB: [birth year], 30 y.o.   MRN: [id number]  CC: Kim Reeves is a 30 y.o. female who presents today for follow up.   HPI: Complains of recurrent vaginitis.  She is not sexually active. Complains of thick increased white to grey vaginal discharge. No  pelvic pain, fever, vaginal itching. She has tried Boric acid suppositories and metrogel with recurrence. She would like PO flagyl today. She plans to abstain from alcohol.   Fatigue improved and she is not as bothered by this.   Stress has decreased significantly. She has graduated from her Master's and feels school was a significant stressor for her.   She doesn't feel 'exhausted'.   Sleep is not restorative. She will get tired in the afternoon and nap.   Had follow-up with Dr. 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  No IV Venofer given at last visit.  Follow-up in 6 months  [facility] evaluation-upcoming appointment with neurology, Dr. Frances Furbish  She has not required hydrochlorothiazide 12.5 mg many weeks.  She suspects her blood pressure has been elevated related to the stress from school, poor sleep    B12 and TSH WNL 06/25/19   HISTORY:  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   Past Surgical History:  Procedure Laterality Date   NO PAST SURGERIES     Family History  Problem Relation Age of Onset   Colon cancer Maternal Grandmother 60   Breast cancer Maternal Grandmother 68   Breast cancer Paternal Aunt 3   Colon cancer  Maternal Aunt 52   Pancreatic cancer Maternal Aunt    Breast cancer Cousin 41    Allergies: Patient has no known allergies. Current Outpatient Medications on File Prior to Visit  Medication Sig Dispense Refill   meloxicam (MOBIC) 7.5 MG tablet Take 1 tablet (7.5 mg total) by mouth daily as needed for pain. 30 tablet 1   polyethylene glycol (MIRALAX) 17 g packet Take 17 g by mouth daily as needed. (Patient not taking: Reported on 06/27/2021) 30 each 0   No current facility-administered medications on file prior to visit.    Social History   Tobacco Use   Smoking status: Never   Smokeless tobacco: Never  Vaping Use   Vaping Use: Never used  Substance Use Topics   Alcohol use: Not Currently    Comment: socially   Drug use: Yes    Types: Other-see comments    Comment: Etables     Review of Systems  Constitutional:  Positive for fatigue. Negative for chills and fever.  Respiratory:  Negative for cough.   Cardiovascular:  Negative for chest pain and palpitations.  Gastrointestinal:  Negative for nausea and vomiting.  Genitourinary:  Positive for vaginal discharge. Negative for dysuria and vaginal pain.     Objective:    BP 112/64 (BP Location: Left Arm, Patient Position: Sitting, Cuff Size: Large)   Pulse 73  Temp 98.5 F (36.9 C) (Oral)   Ht 5' 7.5" (1.715 m)   Wt 182 lb 9.6 oz (82.8 kg)   SpO2 98%   BMI 28.18 kg/m  BP Readings from Last 3 Encounters:  06/27/21 112/64  04/01/21 126/82  03/28/21 116/80   Wt Readings from Last 3 Encounters:  06/27/21 182 lb 9.6 oz (82.8 kg)  04/01/21 183 lb (83 kg)  03/28/21 183 lb 6.4 oz (83.2 kg)    Physical Exam Vitals reviewed.  Constitutional:      Appearance: She is well-developed.  Eyes:     Conjunctiva/sclera: Conjunctivae normal.  Cardiovascular:     Rate and Rhythm: Normal rate and regular rhythm.     Pulses: Normal pulses.     Heart sounds: Normal heart sounds.  Pulmonary:     Effort: Pulmonary effort is  normal.     Breath sounds: Normal breath sounds. No wheezing, rhonchi or rales.  Skin:    General: Skin is warm and dry.  Neurological:     Mental Status: She is alert.  Psychiatric:        Speech: Speech normal.        Behavior: Behavior normal.        Thought Content: Thought content normal.       Assessment & Plan:   Problem List Items Addressed This Visit       Cardiovascular and Mediastinum   HTN (hypertension)    Excellent control.  She is using hydrochlorothiazide 12.5mg   only as needed.  Continue       Relevant Medications   hydrochlorothiazide (MICROZIDE) 12.5 MG capsule     Genitourinary   Bacterial vaginosis - Primary    Recurrent BV . She will self swab for formal diagnosis.  Start p.o. Flagyl.  She understands the importance of abstaining from alcohol completely until she is finished antibiotic course.  Encouraged probiotics to hopefully curtail recurrence.       Relevant Medications   metroNIDAZOLE (FLAGYL) 500 MG tablet   Other Relevant Orders   Cervicovaginal ancillary only( [place])     Other   Fatigue    Improved.  Patient is not particularly bothered by fatigue.  She would like to move forward with neurology appointment next month.  Declines further work-up for fatigue at this time.         I have discontinued Elza Gillooly's ondansetron and docusate sodium. I have also changed her hydrochlorothiazide. Additionally, I am having her start on metroNIDAZOLE. Lastly, I am having her maintain her meloxicam and polyethylene glycol.   Meds ordered this encounter  Medications   hydrochlorothiazide (MICROZIDE) 12.5 MG capsule    Sig: Take 1 capsule (12.5 mg total) by mouth daily as needed. Take for blood pressure greater 130/80.    Dispense:  90 capsule    Refill:  1    Order Specific Question:   Supervising Provider    Answer:   Duncan Dull L [[id number]]   metroNIDAZOLE (FLAGYL) 500 MG tablet    Sig: Take 1 tablet (500 mg total) by mouth 2 (two)  times daily.    Dispense:  14 tablet    Refill:  0    Order Specific Question:   Supervising Provider    Answer:   Sherlene Shams [[id number]]    Return precautions given.   Risks, benefits, and alternatives of the medications and treatment plan prescribed today were discussed, and patient expressed understanding.   Education regarding symptom management and diagnosis given  to patient on AVS.  Continue to follow with [name], FNP for routine health maintenance.   [name] and I agreed with plan.   Rennie Plowman, FNP

## 2021-06-27 NOTE — Assessment & Plan Note (Signed)
Excellent control.  She is using hydrochlorothiazide 12.5mg   only as needed.  Continue

## 2021-06-27 NOTE — Assessment & Plan Note (Signed)
Improved.  Patient is not particularly bothered by fatigue.  She would like to move forward with neurology appointment next month.  Declines further work-up for fatigue at this time.

## 2021-06-27 NOTE — Patient Instructions (Addendum)
Ensure to take probiotics while on antibiotics and also for 2 weeks after completion. This can either be by eating yogurt daily or taking a probiotic supplement over the counter such as Culturelle.It is important to re-colonize the gut with good bacteria and also to prevent any diarrheal infections associated with antibiotic use.    Start flagyl.  Nice to see you!

## 2021-06-29 LAB — CERVICOVAGINAL ANCILLARY ONLY
Bacterial Vaginitis (gardnerella): POSITIVE — AB
Candida Glabrata: NEGATIVE
Candida Vaginitis: NEGATIVE
Comment: NEGATIVE
Comment: NEGATIVE
Comment: NEGATIVE

## 2021-07-19 ENCOUNTER — Institutional Professional Consult (permissible substitution): Payer: Self-pay | Admitting: Neurology

## 2021-08-20 ENCOUNTER — Encounter: Payer: Self-pay | Admitting: Family

## 2021-08-23 ENCOUNTER — Other Ambulatory Visit: Payer: Self-pay

## 2021-08-23 DIAGNOSIS — M542 Cervicalgia: Secondary | ICD-10-CM

## 2021-08-23 MED ORDER — MELOXICAM 7.5 MG PO TABS: 7.5000 mg | ORAL_TABLET | Freq: Every day | ORAL | 1 refills | Status: DC | PRN

## 2021-09-28 ENCOUNTER — Institutional Professional Consult (permissible substitution): Payer: Managed Care, Other (non HMO) | Admitting: Neurology

## 2021-10-24 ENCOUNTER — Inpatient Hospital Stay: Payer: Managed Care, Other (non HMO) | Attending: Oncology

## 2021-10-24 ENCOUNTER — Other Ambulatory Visit: Payer: Self-pay

## 2021-10-24 DIAGNOSIS — D500 Iron deficiency anemia secondary to blood loss (chronic): Secondary | ICD-10-CM

## 2021-10-24 DIAGNOSIS — D509 Iron deficiency anemia, unspecified: Secondary | ICD-10-CM

## 2021-10-24 LAB — CBC WITH DIFFERENTIAL/PLATELET
Abs Immature Granulocytes: 0.03 10*3/uL (ref 0.00–0.07)
Basophils Absolute: 0 10*3/uL (ref 0.0–0.1)
Basophils Relative: 0 %
Eosinophils Absolute: 0 10*3/uL (ref 0.0–0.5)
Eosinophils Relative: 0 %
HCT: 38 % (ref 36.0–46.0)
Hemoglobin: 12 g/dL (ref 12.0–15.0)
Immature Granulocytes: 0 %
Lymphocytes Relative: 32 %
Lymphs Abs: 3.4 10*3/uL (ref 0.7–4.0)
MCH: 23.2 pg — ABNORMAL LOW (ref 26.0–34.0)
MCHC: 31.6 g/dL (ref 30.0–36.0)
MCV: 73.4 fL — ABNORMAL LOW (ref 80.0–100.0)
Monocytes Absolute: 0.7 10*3/uL (ref 0.1–1.0)
Monocytes Relative: 7 %
Neutro Abs: 6.3 10*3/uL (ref 1.7–7.7)
Neutrophils Relative %: 61 %
Platelets: 311 10*3/uL (ref 150–400)
RBC: 5.18 MIL/uL — ABNORMAL HIGH (ref 3.87–5.11)
RDW: 14.7 % (ref 11.5–15.5)
WBC: 10.5 10*3/uL (ref 4.0–10.5)
nRBC: 0 % (ref 0.0–0.2)

## 2021-10-24 LAB — IRON AND TIBC
Iron: 141 ug/dL (ref 28–170)
Saturation Ratios: 33 % — ABNORMAL HIGH (ref 10.4–31.8)
TIBC: 428 ug/dL (ref 250–450)
UIBC: 287 ug/dL

## 2021-10-24 LAB — FERRITIN: Ferritin: 8 ng/mL — ABNORMAL LOW (ref 11–307)

## 2021-10-25 ENCOUNTER — Inpatient Hospital Stay (HOSPITAL_BASED_OUTPATIENT_CLINIC_OR_DEPARTMENT_OTHER): Payer: Managed Care, Other (non HMO) | Admitting: Nurse Practitioner

## 2021-10-25 ENCOUNTER — Inpatient Hospital Stay: Payer: Managed Care, Other (non HMO) | Admitting: Oncology

## 2021-10-25 ENCOUNTER — Encounter: Payer: Self-pay | Admitting: Nurse Practitioner

## 2021-10-25 DIAGNOSIS — D500 Iron deficiency anemia secondary to blood loss (chronic): Secondary | ICD-10-CM | POA: Diagnosis not present

## 2021-10-25 DIAGNOSIS — D563 Thalassemia minor: Secondary | ICD-10-CM

## 2021-10-25 NOTE — Progress Notes (Signed)
HEMATOLOGY-ONCOLOGY  PROGRESS NOTE   Virtual Visit Progress Note  I connected with Kim Reeves on 10/25/21 at  2:45 PM EST by video enabled telemedicine visit and verified that I am speaking with the correct person using two identifiers.   I discussed the limitations, risks, security and privacy concerns of performing an evaluation and management service by telemedicine and the availability of in-person appointments. I also discussed with the patient that there may be a patient responsible charge related to this service. The patient expressed understanding and agreed to proceed.   Other persons participating in the visit and their role in the encounter: none   Patient's location: work/outdoors  Provider's location: clinic   Date of Visit: 10/25/21  Chief Complaint: follow up for anemia  INTERVAL HISTORY Kim Reeves is a 30 y.o. female with above history of thalassemia minor and iron deficiency s/p IV iron who returns to clinic for discussion of lab results and follow up. Periods have normalized. She feels great and denies complaints.   Review of Systems  Constitutional:  Negative for appetite change, chills and fever.  HENT:   Negative for hearing loss and voice change.   Eyes:  Negative for eye problems.  Respiratory:  Negative for chest tightness and cough.   Cardiovascular:  Negative for chest pain.  Gastrointestinal:  Negative for abdominal distention, abdominal pain and blood in stool.  Endocrine: Negative for hot flashes.  Genitourinary:  Negative for difficulty urinating and frequency.   Musculoskeletal:  Negative for arthralgias.  Skin:  Negative for itching and rash.  Neurological:  Negative for extremity weakness.  Hematological:  Negative for adenopathy.  Psychiatric/Behavioral:  Negative for confusion.     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/2016   Past Surgical History:  Procedure Laterality Date   NO PAST SURGERIES      Family History  Problem Relation Age of Onset   Colon cancer Maternal Grandmother 60   Breast cancer Maternal Grandmother 26   Breast cancer Paternal Aunt 20   Colon cancer Maternal Aunt 52   Pancreatic cancer Maternal Aunt    Breast cancer Cousin 52    Social History   Socioeconomic History   Marital status: Single    Spouse name: Not on file   Number of children: 0   Years of education: Not on file   Highest education level: Not on file  Occupational History   Not on file  Tobacco Use   Smoking status: Never   Smokeless tobacco: Never  Vaping Use   Vaping Use: Never used  Substance and Sexual Activity   Alcohol use: Not Currently    Comment: socially   Drug use: Yes    Types: Other-see comments    Comment: Etables    Sexual activity: Yes    Birth control/protection: Condom  Other Topics Concern   Not on file  Social History Narrative   Originally from [place]      Wants to  apply to [employer] for Women's studies      Works as Scientist, research (medical)      Recent break up 10/2016      Social Determinants of Radio broadcast assistant Strain: Not on [employer] Insecurity: Not on file  Transportation Needs: Not on file  Physical Activity: Not on file  Stress: Not on file  Social Connections: Not on file  Intimate Partner Violence: Not on file    Current Outpatient Medications on File Prior to Visit  Medication Sig Dispense Refill   hydrochlorothiazide (MICROZIDE) 12.5 MG capsule Take 1 capsule (12.5 mg total) by mouth daily as needed. Take for blood pressure greater 130/80. 90  capsule 1   meloxicam (MOBIC) 7.5 MG tablet Take 1 tablet (7.5 mg total) by mouth daily as needed for pain. 30 tablet 1   metroNIDAZOLE (FLAGYL) 500 MG tablet Take 1 tablet (500 mg total) by mouth 2 (two) times daily. 14 tablet 0   polyethylene glycol (MIRALAX) 17 g packet Take 17 g by mouth daily as needed. (Patient not taking: Reported on 06/27/2021) 30 each 0   No current facility-administered medications on file prior to visit.    No Known Allergies     Observations/Objective: There were no vitals filed for this visit.  There is no height or weight on file to calculate BMI.  Physical Exam Constitutional:      General: She is not in acute distress. Neurological:     Mental Status: She is alert.    CBC Latest Ref Rng & Units 10/24/2021 04/22/2021 01/11/2021  WBC 4.0 - 10.5 K/uL 10.5 9.1 9.3  Hemoglobin 12.0 - 15.0 g/dL 12.0 11.2(L) 11.0(L)  Hematocrit 36.0 - 46.0 % 38.0 35.5(L) 34.7(L)  Platelets 150 - 400 K/uL 311 349 338   Iron/TIBC/Ferritin/ %Sat    Component Value Date/Time   IRON 141 10/24/2021 1343   IRON 98 02/05/2017 1500   TIBC 428 10/24/2021 1343   TIBC 387 02/05/2017 1500   FERRITIN 8 (L) 10/24/2021 1343   IRONPCTSAT 33 (H) 10/24/2021 1343   IRONPCTSAT 25 02/05/2017 1500    Assessment and Plan: 1. Iron deficiency anemia due to chronic blood loss   2. Thalassemia minor    Iron deficiency anemia- secondary to heavy menstrual periods. s/p IV iron. Iron saturation is elevated and anemia improved. Suspect low ferritin is related to thalassemia. No IV iron at this time.  Family history of cancer - Patient has established with genetic counselor and had a genetic testing done. 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 Menorrhagia- resolved  Follow Up Instructions: RTC in 6 months for labs (cbc, ferritin, iron studies) then next day she can have follow up with Dr. Tasia Catchings virtually for results.   I  discussed the assessment and treatment plan with the patient. The patient was provided an opportunity to ask questions and all were answered. The patient agreed with the plan and demonstrated an understanding of the instructions.   The patient was advised to call back or seek an in-person evaluation if the symptoms worsen or if the condition fails to improve as anticipated.    I spent 15 minutes face-to-face video visit time dedicated to the care of this patient on the date of this encounter to include pre-visit review of hematology notes, labs, face-to-face time with the patient, and post visit ordering of testing/documentation.    Verlon Au, NP 10/25/2021 2:53 PM

## 2021-10-26 LAB — HGB FRACTIONATION CASCADE
Hgb A2: 5 % — ABNORMAL HIGH (ref 1.8–3.2)
Hgb A: 93.8 % — ABNORMAL LOW (ref 96.4–98.8)
Hgb F: 1.2 % (ref 0.0–2.0)
Hgb S: 0 %

## 2021-12-21 ENCOUNTER — Other Ambulatory Visit: Payer: Self-pay

## 2021-12-21 ENCOUNTER — Ambulatory Visit (INDEPENDENT_AMBULATORY_CARE_PROVIDER_SITE_OTHER): Payer: Managed Care, Other (non HMO) | Admitting: Obstetrics & Gynecology

## 2021-12-21 ENCOUNTER — Encounter: Payer: Self-pay | Admitting: Obstetrics & Gynecology

## 2021-12-21 VITALS — BP 120/80 | Ht 67.5 in | Wt 191.0 lb

## 2021-12-21 DIAGNOSIS — Z113 Encounter for screening for infections with a predominantly sexual mode of transmission: Secondary | ICD-10-CM

## 2021-12-21 DIAGNOSIS — Z01419 Encounter for gynecological examination (general) (routine) without abnormal findings: Secondary | ICD-10-CM

## 2021-12-21 DIAGNOSIS — N870 Mild cervical dysplasia: Secondary | ICD-10-CM

## 2021-12-21 MED ORDER — METRONIDAZOLE 0.75 % VA GEL: 1.0000 | Freq: Every day | VAGINAL | 0 refills | Status: AC

## 2021-12-21 NOTE — Progress Notes (Signed)
HPI:      [name] is a 31 y.o. G1P0010 who LMP was Patient's last menstrual period was 11/29/2021., she presents today for her annual examination. The patient has no complaints today. The patient  is sexually active. Her last pap: was normal and has prior CIN I 2020 . The patient does perform self breast exams.  There is notable family history of breast or ovarian cancer in her family.  The patient has regular exercise: yes.  The patient denies current symptoms of depression.    GYN History: Contraception: condoms  PMHx: Past Medical History:  Diagnosis Date   Anemia    Chlamydia    Family history of breast cancer    12/20 cancer genetic tesing letter sent   Family history of breast cancer    Family history of colon cancer    Family history of lung cancer    Family history of pancreatic cancer    Family history of throat cancer    GAD (generalized anxiety disorder)    Gonorrhea    Hypertension    Major depressive disorder, single episode, severe without psychotic features ([place]) 01/25/2016   Past Surgical History:  Procedure Laterality Date   NO PAST SURGERIES     Family History  Problem Relation Age of Onset   Colon cancer Maternal Grandmother 60   Breast cancer Maternal Grandmother 64   Breast cancer Paternal Aunt 76   Colon cancer Maternal Aunt 52   Pancreatic cancer Maternal Aunt    Breast cancer Cousin 41   Social History   Tobacco Use   Smoking status: Never   Smokeless tobacco: Never  Vaping Use   Vaping Use: Never used  Substance Use Topics   Alcohol use: Not Currently    Comment: socially   Drug use: Yes    Types: Other-see comments    Comment: Etables     Current Outpatient Medications:    hydrochlorothiazide (MICROZIDE) 12.5 MG capsule, Take 1 capsule (12.5 mg total) by mouth daily as needed. Take for blood pressure greater 130/80., Disp: 90 capsule, Rfl: 1   meloxicam (MOBIC) 7.5 MG tablet, Take 1 tablet (7.5 mg total) by mouth daily as  needed for pain., Disp: 30 tablet, Rfl: 1   metroNIDAZOLE (METROGEL) 0.75 % vaginal gel, Place 1 Applicatorful vaginally at bedtime for 5 days., Disp: 70 g, Rfl: 0 Allergies: Patient has no known allergies.  Review of Systems  Constitutional:  Negative for chills, fever and malaise/fatigue.  HENT:  Negative for congestion, sinus pain and sore throat.   Eyes:  Negative for blurred vision and pain.  Respiratory:  Negative for cough and wheezing.   Cardiovascular:  Negative for chest pain and leg swelling.  Gastrointestinal:  Negative for abdominal pain, constipation, diarrhea, heartburn, nausea and vomiting.  Genitourinary:  Negative for dysuria, frequency, hematuria and urgency.  Musculoskeletal:  Negative for back pain, joint pain, myalgias and neck pain.  Skin:  Negative for itching and rash.  Neurological:  Negative for dizziness, tremors and weakness.  Endo/Heme/Allergies:  Does not bruise/bleed easily.  Psychiatric/Behavioral:  Negative for depression. The patient is not nervous/anxious and does not have insomnia.    Objective: BP 120/80    Ht 5' 7.5" (1.715 m)    Wt 191 lb (86.6 kg)    LMP 11/29/2021    BMI 29.47 kg/m   Filed Weights   12/21/21 1337  Weight: 191 lb (86.6 kg)   Body mass index is 29.47 kg/m. Physical Exam  Constitutional:      General: She is not in acute distress.    Appearance: She is well-developed.  Genitourinary:     Bladder, rectum and urethral meatus normal.     No lesions in the vagina.     Right Labia: No rash, tenderness or lesions.    Left Labia: No tenderness, lesions or rash.    No vaginal bleeding.      Right Adnexa: not tender and no mass present.    Left Adnexa: not tender and no mass present.    No cervical motion tenderness, friability, lesion or polyp.     Uterus is not enlarged.     No uterine mass detected.    Pelvic exam was performed with patient in the lithotomy position.  Breasts:    Right: No mass, skin change or tenderness.      Left: No mass, skin change or tenderness.  HENT:     Head: Normocephalic and atraumatic. No laceration.     Right Ear: Hearing normal.     Left Ear: Hearing normal.     Mouth/Throat:     Pharynx: Uvula midline.  Eyes:     Pupils: Pupils are equal, round, and reactive to light.  Neck:     Thyroid: No thyromegaly.  Cardiovascular:     Rate and Rhythm: Normal rate and regular rhythm.     Heart sounds: No murmur heard.   No friction rub. No gallop.  Pulmonary:     Effort: Pulmonary effort is normal. No respiratory distress.     Breath sounds: Normal breath sounds. No wheezing.  Abdominal:     General: Bowel sounds are normal. There is no distension.     Palpations: Abdomen is soft.     Tenderness: There is no abdominal tenderness. There is no rebound.  Musculoskeletal:        General: Normal range of motion.     Cervical back: Normal range of motion and neck supple.  Neurological:     Mental Status: She is alert and oriented to person, place, and time.     Cranial Nerves: No cranial nerve deficit.  Skin:    General: Skin is warm and dry.  Psychiatric:        Judgment: Judgment normal.  Vitals reviewed.    Assessment:  ANNUAL EXAM 1. Women's annual routine gynecological examination   2. CIN I (cervical intraepithelial neoplasia I)   3. Screen for STD (sexually transmitted disease)      Screening Plan:            1.  Cervical Screening-  Pap smear done today  2. Breast screening- Exam annually and mammogram>40 planned   3. Colonoscopy every 10 years, Hemoccult testing - after age 36  4. Labs managed by PCP STD labs today Test and Treat BV today as well    (Prefers Metrogel)    Plan Boric Acid twice weekly as prevention    Consider return to [place] that seemed to help lessen post menstrual BV infections in past  5. Counseling for contraception: condoms  [facility] - 12/21/21 1339       Pregnancy Intention Screening   Does the patient want to become pregnant in  the next year? No    Does the patient's partner want to become pregnant in the next year? No    Would the patient like to discuss contraceptive options today? No      Contraception Wrap Up   Current Method No Method -  Other Reason    End Method No Method - Other Reason    Contraception Counseling Provided No            The pregnancy intention screening data noted above was reviewed. Potential methods of contraception were discussed. The patient elected to proceed with Female Condom.      F/U  Return for and Annual when due.  Barnett Applebaum, MD, [facility], [facility] 12/21/2021  1:57 PM

## 2021-12-21 NOTE — Addendum Note (Signed)
Addended by: Gae Dry on: 12/21/2021 02:24 PM   Modules accepted: Orders

## 2021-12-21 NOTE — Patient Instructions (Signed)
PAP every year Labs yearly (with PCP)  Thank you for choosing [facility]. As part of our ongoing efforts to improve patient experience, we would appreciate your feedback. Please fill out the short survey that you will receive by mail or MyChart. Your opinion is important to us! - Dr. Cadarius Nevares  Recommendations to boost your immunity to prevent illness such as viral flu and colds, including covid19, are as follows: Vitamin K2 and Vitamin D3. Take Vitamin K2 at 200-300 mcg daily (usually 2-3 pills daily of the over the counter formulation). Take Vitamin D3 at 3000-4000 U daily (usually 3-4 pills daily of the over the counter formulation). Studies show that these two at high normal levels in your system are very effective in keeping your immunity so strong and protective that you will be unlikely to contract viral illness such as those listed above.  Dr Estelita Iten 

## 2021-12-21 NOTE — Addendum Note (Signed)
Addended by: Nadara Mustard on: 12/21/2021 02:32 PM   Modules accepted: Orders

## 2021-12-22 LAB — HEPATITIS C ANTIBODY: Hep C Virus Ab: <0.1 s/co ratio (ref 0.0–0.9)

## 2021-12-22 LAB — HEPATITIS B SURFACE ANTIGEN: Hepatitis B Surface Ag: NEGATIVE

## 2021-12-22 LAB — RPR: RPR Ser Ql: NONREACTIVE

## 2021-12-22 LAB — HIV ANTIBODY (ROUTINE TESTING W REFLEX): HIV Screen 4th Generation wRfx: NONREACTIVE

## 2021-12-28 ENCOUNTER — Ambulatory Visit: Payer: Managed Care, Other (non HMO) | Admitting: Family

## 2021-12-28 ENCOUNTER — Encounter: Payer: Self-pay | Admitting: Family

## 2021-12-28 ENCOUNTER — Other Ambulatory Visit: Payer: Self-pay

## 2021-12-28 DIAGNOSIS — M542 Cervicalgia: Secondary | ICD-10-CM

## 2021-12-28 DIAGNOSIS — I10 Essential (primary) hypertension: Secondary | ICD-10-CM

## 2021-12-28 MED ORDER — HYDROCHLOROTHIAZIDE 12.5 MG PO CAPS: 12.5000 mg | ORAL_CAPSULE | Freq: Every day | ORAL | 2 refills | Status: DC | PRN

## 2021-12-28 MED ORDER — MELOXICAM 7.5 MG PO TABS: 7.5000 mg | ORAL_TABLET | Freq: Every day | ORAL | 1 refills | Status: DC | PRN

## 2021-12-28 NOTE — Patient Instructions (Signed)
Nice to see you and best of luck in [place]!

## 2021-12-28 NOTE — Progress Notes (Signed)
Subjective:    Patient ID: Kim Reeves, female    DOB: [birth year], 31 y.o.   MRN: [id number]  CC: Kim Reeves is a 31 y.o. female who presents today for follow up.   HPI: She is doing well today.  No new complaints.  She is moving to [place] in a couple weeks however plans to visit [place] and remain established under my care.  She would like to refill meloxicam she takes this as needed for neck pain.    She notes that BP is usually 132/100 at home prior to taking hctz. BP is higher in the morning. She will take hctz 12.5mg  prn and BP will come down 120/75. She doesn't take BP daily.  No cp, sob, HA    HISTORY:  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   Past Surgical History:  Procedure Laterality Date   NO PAST SURGERIES     Family History  Problem Relation Age of Onset   Colon cancer Maternal Grandmother 60   Breast cancer Maternal Grandmother 69   Breast cancer Paternal Aunt 73   Colon cancer Maternal Aunt 52   Pancreatic cancer Maternal Aunt    Breast cancer Cousin 41    Allergies: Patient has no known allergies. No current outpatient medications on file prior to visit.   No current facility-administered medications on file prior to visit.    Social History   Tobacco Use   Smoking status: Never   Smokeless tobacco: Never  Vaping Use   Vaping Use: Never used  Substance Use Topics   Alcohol use: Not Currently    Comment: socially   Drug use: Yes    Types: Other-see comments    Comment: Etables     Review of  Systems  Constitutional:  Negative for chills and fever.  Respiratory:  Negative for cough.   Cardiovascular:  Negative for chest pain and palpitations.  Gastrointestinal:  Negative for nausea and vomiting.     Objective:    BP 128/82 (BP Location: Left Arm, Patient Position: Sitting, Cuff Size: Large)    Pulse 94    Temp 98.4 F (36.9 C) (Oral)    Ht 5' 7.5" (1.715 m)    Wt 191 lb (86.6 kg)    LMP 11/29/2021    SpO2 97%    BMI 29.47 kg/m  BP Readings from Last 3 Encounters:  12/28/21 128/82  12/21/21 120/80  06/27/21 112/64   Wt Readings from Last 3 Encounters:  12/28/21 191 lb (86.6 kg)  12/21/21 191 lb (86.6 kg)  06/27/21 182 lb 9.6 oz (82.8 kg)    Physical Exam Vitals reviewed.  Constitutional:      Appearance: She is well-developed.  Eyes:     Conjunctiva/sclera: Conjunctivae normal.  Cardiovascular:     Rate and Rhythm: Normal rate and regular rhythm.     Pulses: Normal pulses.     Heart sounds: Normal heart sounds.  Pulmonary:  Effort: Pulmonary effort is normal.     Breath sounds: Normal breath sounds. No wheezing, rhonchi or rales.  Skin:    General: Skin is warm and dry.  Neurological:     Mental Status: She is alert.  Psychiatric:        Speech: Speech normal.        Behavior: Behavior normal.        Thought Content: Thought content normal.       Assessment & Plan:   Problem List Items Addressed This Visit       Cardiovascular and Mediastinum   HTN (hypertension)    Chronic, stable.  Patient is comfortable and prefers to use hydrochlorothiazide 12.5 mg as needed.  Blood pressure responds appropriately.  Advised consideration of taking daily.  For now we will continue as needed and emphasized the importance of continued checking blood pressure at home.      Relevant Orders   Comprehensive metabolic panel (Completed)     Other   Neck pain    Chronic, stable.  Continue as needed use of meloxicam.        I have discontinued Kam Marlette's  hydrochlorothiazide, meloxicam, hydrochlorothiazide, and meloxicam.   Meds ordered this encounter  Medications   DISCONTD: hydrochlorothiazide (MICROZIDE) 12.5 MG capsule    Sig: Take 1 capsule (12.5 mg total) by mouth daily as needed. Take for blood pressure greater 130/80.    Dispense:  90 capsule    Refill:  2    Order Specific Question:   Supervising Provider    Answer:   Sherlene Shams [[id number]]   DISCONTD: meloxicam (MOBIC) 7.5 MG tablet    Sig: Take 1 tablet (7.5 mg total) by mouth daily as needed for pain.    Dispense:  90 tablet    Refill:  1    Order Specific Question:   Supervising Provider    Answer:   Sherlene Shams [[id number]]   DISCONTD: meloxicam (MOBIC) 7.5 MG tablet    Sig: Take 1 tablet (7.5 mg total) by mouth daily as needed for pain.    Dispense:  90 tablet    Refill:  1   DISCONTD: hydrochlorothiazide (MICROZIDE) 12.5 MG capsule    Sig: Take 1 capsule (12.5 mg total) by mouth daily as needed. Take for blood pressure greater 130/80.    Dispense:  90 capsule    Refill:  2    Return precautions given.   Risks, benefits, and alternatives of the medications and treatment plan prescribed today were discussed, and patient expressed understanding.   Education regarding symptom management and diagnosis given to patient on AVS.  Continue to follow with [name], FNP for routine health maintenance.   [name] and I agreed with plan.   Rennie Plowman, FNP

## 2021-12-29 ENCOUNTER — Other Ambulatory Visit: Payer: Self-pay

## 2021-12-29 DIAGNOSIS — I10 Essential (primary) hypertension: Secondary | ICD-10-CM

## 2021-12-29 DIAGNOSIS — M542 Cervicalgia: Secondary | ICD-10-CM

## 2021-12-29 LAB — COMPREHENSIVE METABOLIC PANEL WITH GFR
ALT: 11 [IU]/L (ref 0–32)
AST: 13 [IU]/L (ref 0–40)
Albumin/Globulin Ratio: 1.5 (ref 1.2–2.2)
Albumin: 4.3 g/dL (ref 3.8–4.8)
Alkaline Phosphatase: 66 [IU]/L (ref 44–121)
BUN/Creatinine Ratio: 12 (ref 9–23)
BUN: 12 mg/dL (ref 6–20)
Bilirubin Total: 0.4 mg/dL (ref 0.0–1.2)
CO2: 26 mmol/L (ref 20–29)
Calcium: 9.1 mg/dL (ref 8.7–10.2)
Chloride: 103 mmol/L (ref 96–106)
Creatinine, Ser: 1.03 mg/dL — ABNORMAL HIGH (ref 0.57–1.00)
Globulin, Total: 2.8 g/dL (ref 1.5–4.5)
Glucose: 103 mg/dL — ABNORMAL HIGH (ref 70–99)
Potassium: 3.9 mmol/L (ref 3.5–5.2)
Sodium: 140 mmol/L (ref 134–144)
Total Protein: 7.1 g/dL (ref 6.0–8.5)
eGFR: 74 mL/min/{1.73_m2}

## 2021-12-29 LAB — IGP,CTNGTV,APT HPV,RFX16/18,45
Chlamydia, Nuc. Acid Amp: NEGATIVE
Gonococcus, Nuc. Acid Amp: NEGATIVE
HPV Aptima: POSITIVE — AB
Trich vag by NAA: NEGATIVE

## 2021-12-29 MED ORDER — HYDROCHLOROTHIAZIDE 12.5 MG PO CAPS: 12.5000 mg | ORAL_CAPSULE | Freq: Every day | ORAL | 2 refills | Status: DC | PRN

## 2021-12-29 MED ORDER — MELOXICAM 7.5 MG PO TABS: 7.5000 mg | ORAL_TABLET | Freq: Every day | ORAL | 1 refills | Status: DC | PRN

## 2021-12-30 ENCOUNTER — Other Ambulatory Visit: Payer: Self-pay | Admitting: Family

## 2021-12-30 DIAGNOSIS — M542 Cervicalgia: Secondary | ICD-10-CM

## 2021-12-30 DIAGNOSIS — I10 Essential (primary) hypertension: Secondary | ICD-10-CM

## 2021-12-30 MED ORDER — HYDROCHLOROTHIAZIDE 12.5 MG PO CAPS: 12.5000 mg | ORAL_CAPSULE | Freq: Every day | ORAL | 2 refills | Status: DC | PRN

## 2021-12-30 MED ORDER — MELOXICAM 7.5 MG PO TABS: 7.5000 mg | ORAL_TABLET | Freq: Every day | ORAL | 1 refills | Status: DC | PRN

## 2021-12-30 NOTE — Assessment & Plan Note (Signed)
Chronic, stable.  Patient is comfortable and prefers to use hydrochlorothiazide 12.5 mg as needed.  Blood pressure responds appropriately.  Advised consideration of taking daily.  For now we will continue as needed and emphasized the importance of continued checking blood pressure at home.

## 2021-12-30 NOTE — Assessment & Plan Note (Signed)
Chronic, stable.  Continue as needed use of meloxicam.

## 2022-01-07 LAB — NUSWAB VAGINITIS (VG)
Atopobium vaginae: HIGH Score — AB
BVAB 2: HIGH Score — AB
Candida albicans, NAA: NEGATIVE
Candida glabrata, NAA: NEGATIVE
Trich vag by NAA: NEGATIVE

## 2022-01-31 ENCOUNTER — Other Ambulatory Visit: Payer: Self-pay | Admitting: Obstetrics & Gynecology

## 2022-01-31 DIAGNOSIS — Z113 Encounter for screening for infections with a predominantly sexual mode of transmission: Secondary | ICD-10-CM

## 2022-03-30 ENCOUNTER — Ambulatory Visit: Payer: Managed Care, Other (non HMO) | Admitting: Podiatry

## 2022-03-30 ENCOUNTER — Ambulatory Visit (INDEPENDENT_AMBULATORY_CARE_PROVIDER_SITE_OTHER): Payer: Managed Care, Other (non HMO)

## 2022-03-30 DIAGNOSIS — M2010 Hallux valgus (acquired), unspecified foot: Secondary | ICD-10-CM | POA: Diagnosis not present

## 2022-03-30 DIAGNOSIS — M2011 Hallux valgus (acquired), right foot: Secondary | ICD-10-CM

## 2022-03-30 DIAGNOSIS — M2012 Hallux valgus (acquired), left foot: Secondary | ICD-10-CM | POA: Diagnosis not present

## 2022-04-05 NOTE — Progress Notes (Signed)
?Subjective:  ?Patient ID: Kim Reeves, female    DOB: 07/12/1991,  MRN: [id number] ? ?Chief Complaint  ?Patient presents with  ? Ingrown Toenail  ? Bunions  ? ? ?31 y.o. female presents with the above complaint.  Patient presents with complaint of bilateral severe bunion deformity.  She states that they have been causing her some pain especially with shoes rubbing against it.  Is causing her to not walk properly.  She wanted to discuss treatment options for it.  She has tried some shoe gear modification and padding protecting none of which has helped.  She would like to discuss surgical options.  She has not seen anyone as prior to seeing me.  Pain scale 7 out of 10.  Hurts with ambulation. ? ?Review of Systems: Negative except as noted in the HPI. Denies N/V/F/Ch. ? ?Past Medical History:  ?Diagnosis Date  ? Anemia   ? Chlamydia   ? Family history of breast cancer   ? 12/20 cancer genetic tesing letter sent  ? Family history of breast cancer   ? Family history of colon cancer   ? Family history of lung cancer   ? Family history of pancreatic cancer   ? Family history of throat cancer   ? GAD (generalized anxiety disorder)   ? Gonorrhea   ? Hypertension   ? Major depressive disorder, single episode, severe without psychotic features ([facility]) 01/25/2016  ? ? ?Current Outpatient Medications:  ?  hydrochlorothiazide (MICROZIDE) 12.5 MG capsule, Take 1 capsule (12.5 mg total) by mouth daily as needed. For BP >130/80., Disp: 90 capsule, Rfl: 2 ?  meloxicam (MOBIC) 7.5 MG tablet, Take 1 tablet (7.5 mg total) by mouth daily as needed for pain., Disp: 90 tablet, Rfl: 1 ? ?Social History  ? ?Tobacco Use  ?Smoking Status Never  ?Smokeless Tobacco Never  ? ? ?No Known Allergies ?Objective:  ?There were no vitals filed for this visit. ?There is no height or weight on file to calculate BMI. ?Constitutional Well developed. ?Well nourished.  ?Vascular Dorsalis pedis pulses palpable bilaterally. ?Posterior tibial pulses palpable  bilaterally. ?Capillary refill normal to all digits.  ?No cyanosis or clubbing noted. ?Pedal hair growth normal.  ?Neurologic Normal speech. ?Oriented to person, place, and time. ?Epicritic sensation to light touch grossly present bilaterally.  ?Dermatologic Nails well groomed and normal in appearance. ?No open wounds. ?No skin lesions.  ?Orthopedic: Normal joint ROM without pain or crepitus bilaterally. ?Hallux abductovalgus deformity present ?Left 1st MPJ diminished range of motion. ?Left 1st TMT with hypermobility hypermobility. ?Right 1st MPJ diminished range of motion  ?Right 1st TMT with gross hypermobility. ?Lesser digital contractures absent bilaterally.  ? ?Radiographs: ?Taken and reviewed. 3 views of skeletally mature the right foot: Severe bunion deformity noted to bilaterally.  Metatarsal parabola is intact.  No met adductus noted.  Sesamoid position 6 out of 7.  Severe bunion deformity greater than 15 degrees noted.  There is increasing hallux valgus angle.  Increase in intermetatarsal angle. ? ?Assessment:  ? ?1. Hav (hallux abducto valgus), unspecified laterality   ? ?Plan:  ?Patient was evaluated and treated and all questions answered. ? ?Hallux abductovalgus deformity,  ?-XR as above. ?-I explained to the patient the etiology of bunion deformity and worse treatment options were discussed.  Ultimately, I discussed with her she will benefit from Lapidus bunionectomy with a possible phalangeal osteotomy given the severe nature of the bunion deformity if it continues to bother her progressively gets worse have asked her to  come back and see me.  She will think about the surgical options and will get back to me. ?-Risk factors: None ? ?No follow-ups on file. ? ?

## 2022-04-24 ENCOUNTER — Inpatient Hospital Stay: Payer: Managed Care, Other (non HMO) | Attending: Oncology

## 2022-04-24 DIAGNOSIS — D500 Iron deficiency anemia secondary to blood loss (chronic): Secondary | ICD-10-CM | POA: Diagnosis present

## 2022-04-24 LAB — CBC WITH DIFFERENTIAL/PLATELET
Abs Immature Granulocytes: 0.02 10*3/uL (ref 0.00–0.07)
Basophils Absolute: 0 10*3/uL (ref 0.0–0.1)
Basophils Relative: 0 %
Eosinophils Absolute: 0 10*3/uL (ref 0.0–0.5)
Eosinophils Relative: 0 %
HCT: 35.2 % — ABNORMAL LOW (ref 36.0–46.0)
Hemoglobin: 11 g/dL — ABNORMAL LOW (ref 12.0–15.0)
Immature Granulocytes: 0 %
Lymphocytes Relative: 33 %
Lymphs Abs: 3.4 10*3/uL (ref 0.7–4.0)
MCH: 22.7 pg — ABNORMAL LOW (ref 26.0–34.0)
MCHC: 31.3 g/dL (ref 30.0–36.0)
MCV: 72.6 fL — ABNORMAL LOW (ref 80.0–100.0)
Monocytes Absolute: 0.7 10*3/uL (ref 0.1–1.0)
Monocytes Relative: 7 %
Neutro Abs: 6.1 10*3/uL (ref 1.7–7.7)
Neutrophils Relative %: 60 %
Platelets: 362 10*3/uL (ref 150–400)
RBC: 4.85 MIL/uL (ref 3.87–5.11)
RDW: 14.8 % (ref 11.5–15.5)
WBC: 10.2 10*3/uL (ref 4.0–10.5)
nRBC: 0 % (ref 0.0–0.2)

## 2022-04-24 LAB — FERRITIN: Ferritin: 5 ng/mL — ABNORMAL LOW (ref 11–307)

## 2022-04-24 LAB — IRON AND TIBC
Iron: 52 ug/dL (ref 28–170)
Saturation Ratios: 11 % (ref 10.4–31.8)
TIBC: 462 ug/dL — ABNORMAL HIGH (ref 250–450)
UIBC: 410 ug/dL

## 2022-04-25 ENCOUNTER — Inpatient Hospital Stay (HOSPITAL_BASED_OUTPATIENT_CLINIC_OR_DEPARTMENT_OTHER): Payer: Managed Care, Other (non HMO) | Admitting: Oncology

## 2022-04-25 ENCOUNTER — Encounter: Payer: Self-pay | Admitting: Oncology

## 2022-04-25 DIAGNOSIS — D500 Iron deficiency anemia secondary to blood loss (chronic): Secondary | ICD-10-CM

## 2022-04-25 MED ORDER — IRON-VITAMIN C 65-125 MG PO TABS
1.0000 | ORAL_TABLET | Freq: Every day | ORAL | 4 refills | Status: DC
Start: 2022-04-25 — End: 2024-01-10

## 2022-04-25 NOTE — Progress Notes (Signed)
HEMATOLOGY-ONCOLOGY TeleHEALTH VISIT PROGRESS NOTE  I connected with Kim Reeves on 04/25/22 at  2:30 PM EDT by video enabled telemedicine visit and verified that I am speaking with the correct person using two identifiers. I discussed the limitations, risks, security and privacy concerns of performing an evaluation and management service by telemedicine and the availability of in-person appointments. I also discussed with the patient that there may be a patient responsible charge related to this service. The patient expressed understanding and agreed to proceed.   Other persons participating in the visit and their role in the encounter:  None  Patient's location: in her car- not driving Provider's location: office Chief Complaint: follow up for anemia.    INTERVAL HISTORY Kim Reeves is a 31 y.o. female who has above history reviewed by me today presents for follow up visit for management of anemia, thalassemia minor.  Patient reports feeling well.  She has no new complaints. Patient just finished her menstrual cycle recently. Review of Systems  Constitutional:  Negative for appetite change, chills and fever.  HENT:   Negative for hearing loss and voice change.   Eyes:  Negative for eye problems.  Respiratory:  Negative for chest tightness and cough.   Cardiovascular:  Negative for chest pain.  Gastrointestinal:  Negative for abdominal distention, abdominal pain and blood in stool.  Endocrine: Negative for hot flashes.  Genitourinary:  Negative for difficulty urinating and frequency.   Musculoskeletal:  Negative for arthralgias.  Skin:  Negative for itching and rash.  Neurological:  Negative for extremity weakness.  Hematological:  Negative for adenopathy.  Psychiatric/Behavioral:  Negative for confusion.    Past Medical History:  Diagnosis Date   Anemia    Chlamydia    Family history of breast cancer    12/20 cancer genetic tesing letter sent   Family history of breast cancer     Family history of colon cancer    Family history of lung cancer    Family history of pancreatic cancer    Family history of throat cancer    GAD (generalized anxiety disorder)    Gonorrhea    Hypertension    Major depressive disorder, single episode, severe without psychotic features (HCC) 01/25/2016   Past Surgical History:  Procedure Laterality Date   NO PAST SURGERIES      Family History  Problem Relation Age of Onset   Colon cancer Maternal Grandmother 60   Breast cancer Maternal Grandmother 3170   Breast cancer Paternal Aunt 2440   Colon cancer Maternal Aunt 52   Pancreatic cancer Maternal Aunt    Breast cancer Cousin 5741    Social History   Socioeconomic History   Marital status: Single    Spouse name: Not on file   Number of children: 0   Years of education: Not on file   Highest education level: Not on file  Occupational History   Not on file  Tobacco Use   Smoking status: Never   Smokeless tobacco: Never  Vaping Use   Vaping Use: Never used  Substance and Sexual Activity   Alcohol use: Not Currently    Comment: socially   Drug use: Yes    Types: Other-see comments    Comment: Etables    Sexual activity: Yes    Birth control/protection: Condom  Other Topics Concern   Not on file  Social History Narrative   Originally from [place]      Wants to [facility] [employer]  Works as Air cabin crew      Recent break up 10/2016      Social Determinants of Corporate investment banker Strain: Not on file  Food Insecurity: Not on file  Transportation Needs: Not on file  Physical Activity: Not on file  Stress: Not on file  Social Connections: Not on file  Intimate Partner Violence: Not on file    Current Outpatient Medications on File Prior to Visit  Medication Sig Dispense Refill   hydrochlorothiazide (MICROZIDE) 12.5 MG capsule Take 1 capsule (12.5 mg total) by mouth daily as needed. For BP >130/80. 90 capsule 2   meloxicam (MOBIC) 7.5 MG  tablet Take 1 tablet (7.5 mg total) by mouth daily as needed for pain. 90 tablet 1   No current facility-administered medications on file prior to visit.    No Known Allergies     Observations/Objective: There were no vitals filed for this visit.  There is no height or weight on file to calculate BMI.  Physical Exam Constitutional:      General: She is not in acute distress. Neurological:     Mental Status: She is alert.    CBC    Component Value Date/Time   WBC 10.2 04/24/2022 1343   RBC 4.85 04/24/2022 1343   HGB 11.0 (L) 04/24/2022 1343   HGB 11.3 12/31/2020 1614   HCT 35.2 (L) 04/24/2022 1343   HCT 35.5 12/31/2020 1614   PLT 362 04/24/2022 1343   PLT 386 12/31/2020 1614   MCV 72.6 (L) 04/24/2022 1343   MCV 72 (L) 12/31/2020 1614   MCH 22.7 (L) 04/24/2022 1343   MCHC 31.3 04/24/2022 1343   RDW 14.8 04/24/2022 1343   RDW 14.7 12/31/2020 1614   LYMPHSABS 3.4 04/24/2022 1343   LYMPHSABS 3.9 (H) 12/31/2020 1614   MONOABS 0.7 04/24/2022 1343   EOSABS 0.0 04/24/2022 1343   EOSABS 0.1 12/31/2020 1614   BASOSABS 0.0 04/24/2022 1343   BASOSABS 0.0 12/31/2020 1614    CMP     Component Value Date/Time   NA 140 12/28/2021 1542   K 3.9 12/28/2021 1542   CL 103 12/28/2021 1542   CO2 26 12/28/2021 1542   GLUCOSE 103 (H) 12/28/2021 1542   GLUCOSE 105 (H) 01/11/2021 1041   BUN 12 12/28/2021 1542   CREATININE 1.03 (H) 12/28/2021 1542   CALCIUM 9.1 12/28/2021 1542   PROT 7.1 12/28/2021 1542   ALBUMIN 4.3 12/28/2021 1542   AST 13 12/28/2021 1542   ALT 11 12/28/2021 1542   ALKPHOS 66 12/28/2021 1542   BILITOT 0.4 12/28/2021 1542   GFRNONAA >60 01/11/2021 1041   GFRAA 109 10/27/2020 1114     Assessment and Plan: 1. Iron deficiency anemia due to chronic blood loss     #Iron deficiency anemia, Labs reviewed and discussed with patient. Ferritin is decreased to 5, TIBC increased at 462 Hemoglobin 11, Patient previously tolerated Venofer.  Concerned about cost. She is  willing to try oral iron supplementation. Recommend vitamin C 65 mg daily.  Prescription sent to pharmacy.  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    Follow Up Instructions: 4 months  I discussed the assessment and treatment plan with the patient. The patient was provided an opportunity to ask questions and all were answered. The patient agreed with the plan and demonstrated an understanding of the instructions.  The patient was advised to call back or seek an in-person evaluation if the symptoms worsen or if  the condition fails to improve as anticipated.    Rickard Patience, MD 04/25/2022 10:55 PM

## 2022-06-08 ENCOUNTER — Ambulatory Visit: Payer: Managed Care, Other (non HMO) | Admitting: Obstetrics & Gynecology

## 2022-06-08 ENCOUNTER — Telehealth: Payer: Self-pay | Admitting: Obstetrics & Gynecology

## 2022-06-08 ENCOUNTER — Encounter: Payer: Self-pay | Admitting: Obstetrics & Gynecology

## 2022-06-08 ENCOUNTER — Other Ambulatory Visit (HOSPITAL_COMMUNITY)
Admission: RE | Admit: 2022-06-08 | Discharge: 2022-06-08 | Disposition: A | Discharge status: Home / Self Care | Payer: Managed Care, Other (non HMO) | Source: Ambulatory Visit | Attending: Obstetrics & Gynecology | Admitting: Obstetrics & Gynecology

## 2022-06-08 VITALS — BP 110/70 | Ht 67.5 in | Wt 190.0 lb

## 2022-06-08 DIAGNOSIS — N921 Excessive and frequent menstruation with irregular cycle: Secondary | ICD-10-CM | POA: Diagnosis not present

## 2022-06-08 DIAGNOSIS — N898 Other specified noninflammatory disorders of vagina: Secondary | ICD-10-CM | POA: Diagnosis present

## 2022-06-08 DIAGNOSIS — Z113 Encounter for screening for infections with a predominantly sexual mode of transmission: Secondary | ICD-10-CM | POA: Diagnosis not present

## 2022-06-08 DIAGNOSIS — R399 Unspecified symptoms and signs involving the genitourinary system: Secondary | ICD-10-CM

## 2022-06-08 DIAGNOSIS — N760 Acute vaginitis: Secondary | ICD-10-CM

## 2022-06-08 DIAGNOSIS — B9689 Other specified bacterial agents as the cause of diseases classified elsewhere: Secondary | ICD-10-CM

## 2022-06-08 LAB — POCT URINALYSIS DIPSTICK
Bilirubin, UA: NEGATIVE
Blood, UA: NEGATIVE
Glucose, UA: NEGATIVE
Ketones, UA: NEGATIVE
Nitrite, UA: NEGATIVE
Protein, UA: POSITIVE — AB
Spec Grav, UA: 1.025 (ref 1.010–1.025)
Urobilinogen, UA: 0.2 E.U./dL
pH, UA: 5 (ref 5.0–8.0)

## 2022-06-08 LAB — POCT URINE PREGNANCY: Preg Test, Ur: NEGATIVE

## 2022-06-08 MED ORDER — SULFAMETHOXAZOLE-TRIMETHOPRIM 800-160 MG PO TABS: 1.0000 | ORAL_TABLET | Freq: Two times a day (BID) | ORAL | 0 refills | Status: DC

## 2022-06-08 NOTE — Progress Notes (Signed)
   Subjective:    Patient ID: Kim Reeves, female    DOB: 1991-09-05, 31 y.o.   MRN: [id number]  HPI 31 yo single P0 here for evaluation of some irregular bleeding. This happened while she was on vacation in [place] with her new partner. They had unprotected sex for the first time on this vacation just prior to the onset of the irregular bleeding. She has some dysuria.  She is requesting STI testing (no HSV test today).  She has a h/o chronic BV, but has also had trich in the past.   Review of Systems     Objective:   Physical Exam Well nourished, well hydrated African American female, no apparent distress She is ambulating and conversing normally. Genitourinary:             External: Normal external female genitalia.  Normal urethral meatus, normal Bartholin's and Skene's glands.               Vagina: Normal vaginal mucosa, no evidence of prolapse.               Cervix: Grossly normal in appearance, no bleeding, frothy discharge c/w either trich or BV            UA + leuk esterase      Assessment & Plan:  1) UTI- culture sent and treat with bactrim DS 2) Irregular bleeding- check cervical cultures 3) routine STI testing ordered 4) ASCUS with + HR HPV pap 12/2021 (also with h/o LGSIL pap in 2020) -schedule colpo 5) unprotected sex- she would be okay with pregnancy and I have rec'd that she start MVI daily

## 2022-06-08 NOTE — Telephone Encounter (Signed)
Upon checking out for office visit, I Checked patient out advising she needed to scheduled for Colposcopy appointment. Patient refused scheduling and states "she will call back to be scheduled at an later date".

## 2022-06-09 LAB — RPR: RPR Ser Ql: NONREACTIVE

## 2022-06-09 LAB — CERVICOVAGINAL ANCILLARY ONLY
Bacterial Vaginitis (gardnerella): NEGATIVE
Candida Glabrata: NEGATIVE
Candida Vaginitis: POSITIVE — AB
Chlamydia: NEGATIVE
Comment: NEGATIVE
Comment: NEGATIVE
Comment: NEGATIVE
Comment: NEGATIVE
Comment: NEGATIVE
Comment: NORMAL
Neisseria Gonorrhea: NEGATIVE
Trichomonas: NEGATIVE

## 2022-06-09 LAB — HEPATITIS B SURFACE ANTIGEN: Hepatitis B Surface Ag: NEGATIVE

## 2022-06-09 LAB — HEPATITIS C ANTIBODY: Hep C Virus Ab: NONREACTIVE

## 2022-06-09 LAB — HIV ANTIBODY (ROUTINE TESTING W REFLEX): HIV Screen 4th Generation wRfx: NONREACTIVE

## 2022-06-12 ENCOUNTER — Other Ambulatory Visit: Payer: Self-pay | Admitting: Obstetrics & Gynecology

## 2022-06-12 ENCOUNTER — Encounter: Payer: Self-pay | Admitting: Obstetrics & Gynecology

## 2022-06-12 MED ORDER — FLUCONAZOLE 150 MG PO TABS: 150.0000 mg | ORAL_TABLET | Freq: Once | ORAL | 1 refills | Status: AC

## 2022-06-12 NOTE — Progress Notes (Signed)
Diflucan prescribed for yeast seen on testing. Patient notified via Mychart.

## 2022-06-13 ENCOUNTER — Encounter: Payer: Self-pay | Admitting: Obstetrics & Gynecology

## 2022-06-13 LAB — URINE CULTURE

## 2022-06-27 ENCOUNTER — Ambulatory Visit: Payer: Managed Care, Other (non HMO) | Admitting: Family

## 2022-07-03 ENCOUNTER — Encounter: Payer: Self-pay | Admitting: Family

## 2022-07-03 ENCOUNTER — Ambulatory Visit: Payer: Managed Care, Other (non HMO) | Admitting: Family

## 2022-07-03 VITALS — BP 130/78 | HR 74 | Temp 97.9°F | Ht 67.0 in | Wt 188.4 lb

## 2022-07-03 DIAGNOSIS — R350 Frequency of micturition: Secondary | ICD-10-CM

## 2022-07-03 DIAGNOSIS — I10 Essential (primary) hypertension: Secondary | ICD-10-CM

## 2022-07-03 NOTE — Progress Notes (Signed)
Subjective:    Patient ID: Kim Reeves, female    DOB: December 11, 1990, 31 y.o.   MRN: [id number]  CC: Kim Reeves is a 31 y.o. female who presents today for follow up.   HPI: Continues to have urinary urgency x 3 weeks, unchanged. Dysuria has resolved.  She has completed bactrim No urinary incontinence, fever, flank pain.    No h/o DM Urine culture 07/17/22 showed klebsiella   HTN-with hydrochlorothiazide 12.5 mg as needed approx twice weekly. She regularly follows low salt diet. She has BP machine at home. No Cp.  HISTORY:  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   Past Surgical History:  Procedure Laterality Date   NO PAST SURGERIES     Family History  Problem Relation Age of Onset   Colon cancer Maternal Grandmother 60   Breast cancer Maternal Grandmother 31   Breast cancer Paternal Aunt 58   Colon cancer Maternal Aunt 52   Pancreatic cancer Maternal Aunt    Breast cancer Cousin 41    Allergies: Patient has no known allergies. Current Outpatient Medications on File Prior to Visit  Medication Sig Dispense Refill   hydrochlorothiazide (MICROZIDE) 12.5 MG capsule Take 1 capsule (12.5 mg total) by mouth daily as needed. For BP >130/80. 90 capsule 2   Iron-Vitamin C 65-125 MG TABS Take 1 tablet by mouth daily. 30 tablet 4   meloxicam (MOBIC) 7.5 MG tablet Take 1 tablet (7.5 mg total) by mouth daily as needed for pain. 90 tablet 1   sulfamethoxazole-trimethoprim (BACTRIM DS) 800-160 MG tablet Take 1 tablet by mouth 2 (two) times daily. 14  tablet 0   No current facility-administered medications on file prior to visit.    Social History   Tobacco Use   Smoking status: Never   Smokeless tobacco: Never  Vaping Use   Vaping Use: Never used  Substance Use Topics   Alcohol use: Yes    Comment: socially   Drug use: Not Currently    Types: Other-see comments    Comment: Etables     Review of Systems  Constitutional:  Negative for chills and fever.  Respiratory:  Negative for cough.   Cardiovascular:  Negative for chest pain and palpitations.  Gastrointestinal:  Negative for nausea and vomiting.  Genitourinary:  Positive for frequency. Negative for dysuria.      Objective:    BP 130/78 (BP Location: Left Arm, Patient Position: Sitting, Cuff Size: Large)   Pulse 74   Temp 97.9 F (36.6 C) (Oral)   Ht 5\' 7"  (1.702 m)   Wt 188 lb 6.4 oz (85.5 kg)   LMP 05/17/2022 (Exact Date)   SpO2 99%   BMI 29.51 kg/m  BP Readings from Last 3 Encounters:  07/03/22 130/78  06/08/22 110/70  12/28/21 128/82   Wt Readings from Last 3 Encounters:  07/03/22 188 lb 6.4 oz (85.5 kg)  06/08/22 190 lb (86.2 kg)  12/28/21 191 lb (86.6 kg)  Physical Exam Vitals reviewed.  Constitutional:      Appearance: She is well-developed.  Eyes:     Conjunctiva/sclera: Conjunctivae normal.  Cardiovascular:     Rate and Rhythm: Normal rate and regular rhythm.     Pulses: Normal pulses.     Heart sounds: Normal heart sounds.  Pulmonary:     Effort: Pulmonary effort is normal.     Breath sounds: Normal breath sounds. No wheezing, rhonchi or rales.  Skin:    General: Skin is warm and dry.  Neurological:     Mental Status: She is alert.  Psychiatric:        Speech: Speech normal.        Behavior: Behavior normal.        Thought Content: Thought content normal.        Assessment & Plan:   Problem List Items Addressed This Visit       Cardiovascular and Mediastinum   HTN (hypertension)    Chronic, stable.  Patient prefers to  take hydrochlorothiazide 12.5 as needed ( usually twice per wee) versus daily.  She will continue close monitoring of blood pressure at home and let me know if blood pressure is not consistently well controlled.      Relevant Orders   Urine Culture   Urinalysis     Other   Urinary frequency - Primary    Patient afebrile.  Reviewed prior urine culture.  Patient agreeable to recollecting urine and awaiting urine culture results prior to treatment again.  She will let me know how she is doing.      Relevant Orders   Ambulatory referral to Obstetrics / Gynecology     I am having Kim Reeves maintain her hydrochlorothiazide, meloxicam, Iron-Vitamin C, and sulfamethoxazole-trimethoprim.   No orders of the defined types were placed in this encounter.   Return precautions given.   Risks, benefits, and alternatives of the medications and treatment plan prescribed today were discussed, and patient expressed understanding.   Education regarding symptom management and diagnosis given to patient on AVS.  Continue to follow with [name], FNP for routine health maintenance.   Kim Reeves and I agreed with plan.   Rennie Plowman, FNP

## 2022-07-03 NOTE — Assessment & Plan Note (Signed)
Chronic, stable.  Patient prefers to take hydrochlorothiazide 12.5 as needed ( usually twice per wee) versus daily.  She will continue close monitoring of blood pressure at home and let me know if blood pressure is not consistently well controlled.

## 2022-07-03 NOTE — Assessment & Plan Note (Signed)
Patient afebrile.  Reviewed prior urine culture.  Patient agreeable to recollecting urine and awaiting urine culture results prior to treatment again.  She will let me know how she is doing.

## 2022-07-04 LAB — URINALYSIS
Bilirubin, UA: NEGATIVE
Glucose, UA: NEGATIVE
Leukocytes,UA: NEGATIVE
Nitrite, UA: NEGATIVE
Protein,UA: NEGATIVE
RBC, UA: NEGATIVE
Specific Gravity, UA: 1.029 (ref 1.005–1.030)
Urobilinogen, Ur: 1 mg/dL (ref 0.2–1.0)
pH, UA: 5.5 (ref 5.0–7.5)

## 2022-07-06 LAB — URINE CULTURE

## 2022-07-17 ENCOUNTER — Ambulatory Visit: Payer: Managed Care, Other (non HMO) | Admitting: Dermatology

## 2022-07-17 DIAGNOSIS — L408 Other psoriasis: Secondary | ICD-10-CM

## 2022-07-17 MED ORDER — HYDROCORTISONE 2.5 % EX CREA: TOPICAL_CREAM | Freq: Every day | CUTANEOUS | 11 refills | Status: DC

## 2022-07-17 MED ORDER — KETOCONAZOLE 2 % EX SHAM: 1.0000 | MEDICATED_SHAMPOO | CUTANEOUS | 6 refills | Status: DC

## 2022-07-17 MED ORDER — CLOBETASOL PROPIONATE 0.05 % EX SHAM: 1.0000 | MEDICATED_SHAMPOO | CUTANEOUS | 6 refills | Status: DC

## 2022-07-17 MED ORDER — KETOCONAZOLE 2 % EX CREA: 1.0000 | TOPICAL_CREAM | Freq: Every day | CUTANEOUS | 11 refills | Status: DC

## 2022-07-17 NOTE — Patient Instructions (Signed)
Due to recent changes in healthcare laws, you may see results of your pathology and/or laboratory studies on MyChart before the doctors have had a chance to review them. We understand that in some cases there may be results that are confusing or concerning to you. Please understand that not all results are received at the same time and often the doctors may need to interpret multiple results in order to provide you with the best plan of care or course of treatment. Therefore, we ask that you please give us 2 business days to thoroughly review all your results before contacting the office for clarification. Should we see a critical lab result, you will be contacted sooner.   If You Need Anything After Your Visit  If you have any questions or concerns for your doctor, please call our main line at [phone] and press option 4 to reach your doctor's medical assistant. If no one answers, please leave a voicemail as directed and we will return your call as soon as possible. Messages left after 4 pm will be answered the following business day.   You may also send us a message via MyChart. We typically respond to MyChart messages within 1-2 business days.  For prescription refills, please ask your pharmacy to contact our office. Our fax number is [phone].  If you have an urgent issue when the clinic is closed that cannot wait until the next business day, you can page your doctor at the number below.    Please note that while we do our best to be available for urgent issues outside of office hours, we are not available 24/7.   If you have an urgent issue and are unable to reach us, you may choose to seek medical care at your doctor's office, retail clinic, urgent care center, or emergency room.  If you have a medical emergency, please immediately call 911 or go to the emergency department.  Pager Numbers  - Dr. Kowalski: [phone]  - Dr. Moye: [phone]  - Dr. Stewart:  [phone]  In the event of inclement weather, please call our main line at [phone] for an update on the status of any delays or closures.  Dermatology Medication Tips: Please keep the boxes that topical medications come in in order to help keep track of the instructions about where and how to use these. Pharmacies typically print the medication instructions only on the boxes and not directly on the medication tubes.   If your medication is too expensive, please contact our office at [phone] option 4 or send us a message through MyChart.   We are unable to tell what your co-pay for medications will be in advance as this is different depending on your insurance coverage. However, we may be able to find a substitute medication at lower cost or fill out paperwork to get insurance to cover a needed medication.   If a prior authorization is required to get your medication covered by your insurance company, please allow us 1-2 business days to complete this process.  Drug prices often vary depending on where the prescription is filled and some pharmacies may offer cheaper prices.  The website www.goodrx.com contains coupons for medications through different pharmacies. The prices here do not account for what the cost may be with help from insurance (it may be cheaper with your insurance), but the website can give you the price if you did not use any insurance.  - You can print the associated coupon and take it with   your prescription to the pharmacy.  - You may also stop by our office during regular business hours and pick up a GoodRx coupon card.  - If you need your prescription sent electronically to a different pharmacy, notify our office through [place] MyChart or by phone at [phone] option 4.     Si Usted Necesita Algo Despus de Su Visita  Tambin puede enviarnos un mensaje a travs de MyChart. Por lo general respondemos a los mensajes de MyChart en el transcurso de 1 a 2  das hbiles.  Para renovar recetas, por favor pida a su farmacia que se ponga en contacto con nuestra oficina. Nuestro nmero de fax es el [phone].  Si tiene un asunto urgente cuando la clnica est cerrada y que no puede esperar hasta el siguiente da hbil, puede llamar/localizar a su doctor(a) al nmero que aparece a continuacin.   Por favor, tenga en cuenta que aunque hacemos todo lo posible para estar disponibles para asuntos urgentes fuera del horario de oficina, no estamos disponibles las 24 horas del da, los 7 das de la semana.   Si tiene un problema urgente y no puede comunicarse con nosotros, puede optar por buscar atencin mdica  en el consultorio de su doctor(a), en una clnica privada, en un centro de atencin urgente o en una sala de emergencias.  Si tiene una emergencia mdica, por favor llame inmediatamente al 911 o vaya a la sala de emergencias.  Nmeros de bper  - Dr. Kowalski: [phone]  - Dra. Moye: [phone]  - Dra. Stewart: [phone]  En caso de inclemencias del tiempo, por favor llame a nuestra lnea principal al [phone] para una actualizacin sobre el estado de cualquier retraso o cierre.  Consejos para la medicacin en dermatologa: Por favor, guarde las cajas en las que vienen los medicamentos de uso tpico para ayudarle a seguir las instrucciones sobre dnde y cmo usarlos. Las farmacias generalmente imprimen las instrucciones del medicamento slo en las cajas y no directamente en los tubos del medicamento.   Si su medicamento es muy caro, por favor, pngase en contacto con nuestra oficina llamando al [phone] y presione la opcin 4 o envenos un mensaje a travs de MyChart.   No podemos decirle cul ser su copago por los medicamentos por adelantado ya que esto es diferente dependiendo de la cobertura de su seguro. Sin embargo, es posible que podamos encontrar un medicamento sustituto a menor costo o llenar un formulario para que el  seguro cubra el medicamento que se considera necesario.   Si se requiere una autorizacin previa para que su compaa de seguros cubra su medicamento, por favor permtanos de 1 a 2 das hbiles para completar este proceso.  Los precios de los medicamentos varan con frecuencia dependiendo del lugar de dnde se surte la receta y alguna farmacias pueden ofrecer precios ms baratos.  El sitio web www.goodrx.com tiene cupones para medicamentos de diferentes farmacias. Los precios aqu no tienen en cuenta lo que podra costar con la ayuda del seguro (puede ser ms barato con su seguro), pero el sitio web puede darle el precio si no utiliz ningn seguro.  - Puede imprimir el cupn correspondiente y llevarlo con su receta a la farmacia.  - Tambin puede pasar por nuestra oficina durante el horario de atencin regular y recoger una tarjeta de cupones de GoodRx.  - Si necesita que su receta se enve electrnicamente a una farmacia diferente, informe a nuestra oficina a travs de MyChart de [place]   o por telfono llamando al [phone] y presione la opcin 4.  

## 2022-07-17 NOTE — Progress Notes (Signed)
   New Patient Visit  Subjective  Kim Reeves is a 31 y.o. female who presents for the following: Other (Dandruff of scalp, eyebrows, around nose and behind ears - clear today ).  The following portions of the chart were reviewed this encounter and updated as appropriate:   Tobacco  Allergies  Meds  Problems  Med Hx  Surg Hx  Fam Hx      Review of Systems:  No other skin or systemic complaints except as noted in HPI or Assessment and Plan.  Objective  Well appearing patient in no apparent distress; mood and affect are within normal limits.  A focused examination was performed including scalp, face. Relevant physical exam findings are noted in the Assessment and Plan.  Scalp, brow area, postauricular areas, nasolabial areas Scale   Assessment & Plan   Sebopsoriasis Scalp, brow area, postauricular areas, nasolabial areas  Seborrheic Dermatitis  -  is a chronic persistent rash characterized by pinkness and scaling most commonly of the mid face but also can occur on the scalp (dandruff), ears; mid chest, mid back and groin.  It tends to be exacerbated by stress and cooler weather.  People who have neurologic disease may experience new onset or exacerbation of existing seborrheic dermatitis.  The condition is not curable but treatable and can be controlled.  Psoriasis is a chronic non-curable, but treatable genetic/hereditary disease that may have other systemic features affecting other organ systems such as joints (Psoriatic Arthritis). It is associated with an increased risk of inflammatory bowel disease, heart disease, non-alcoholic fatty liver disease, and depression.    Start Ketoconazole 2% cream qhs Monday, Wednesday, Friday to affected areas of face.  Hydrocortisone 2.5% cream qhs Tuesday, Thursday, Saturday to affected areas of face.  Ketoconazole 2% shampoo alternating with  Clobetasol shampoo every other week  ketoconazole (NIZORAL) 2 % cream - Scalp, brow area,  postauricular areas, nasolabial areas Apply 1 Application topically at bedtime. Monday, Wednesday, Friday hydrocortisone 2.5 % cream - Scalp, brow area, postauricular areas, nasolabial areas Apply topically at bedtime. Tuesday, Thursday, Saturday Clobetasol Propionate 0.05 % shampoo - Scalp, brow area, postauricular areas, nasolabial areas Apply 1 Application topically as directed. Every other week ketoconazole (NIZORAL) 2 % shampoo - Scalp, brow area, postauricular areas, nasolabial areas Apply 1 Application topically as directed. Every other week  Return in about 3 months (around 10/17/2022).  I, Joanie Coddington, CMA, am acting as scribe for Armida Sans, MD . Documentation: I have reviewed the above documentation for accuracy and completeness, and I agree with the above.  Armida Sans, MD

## 2022-07-24 ENCOUNTER — Encounter: Payer: Self-pay | Admitting: Dermatology

## 2022-08-28 ENCOUNTER — Inpatient Hospital Stay: Payer: Managed Care, Other (non HMO) | Attending: Oncology

## 2022-08-28 DIAGNOSIS — D563 Thalassemia minor: Secondary | ICD-10-CM | POA: Diagnosis present

## 2022-08-28 DIAGNOSIS — D509 Iron deficiency anemia, unspecified: Secondary | ICD-10-CM | POA: Diagnosis not present

## 2022-08-28 DIAGNOSIS — D500 Iron deficiency anemia secondary to blood loss (chronic): Secondary | ICD-10-CM

## 2022-08-28 LAB — CBC WITH DIFFERENTIAL/PLATELET
Abs Immature Granulocytes: 0.02 10*3/uL (ref 0.00–0.07)
Basophils Absolute: 0 10*3/uL (ref 0.0–0.1)
Basophils Relative: 0 %
Eosinophils Absolute: 0.1 10*3/uL (ref 0.0–0.5)
Eosinophils Relative: 1 %
HCT: 38.6 % (ref 36.0–46.0)
Hemoglobin: 12.6 g/dL (ref 12.0–15.0)
Immature Granulocytes: 0 %
Lymphocytes Relative: 36 %
Lymphs Abs: 3.6 10*3/uL (ref 0.7–4.0)
MCH: 23.3 pg — ABNORMAL LOW (ref 26.0–34.0)
MCHC: 32.6 g/dL (ref 30.0–36.0)
MCV: 71.3 fL — ABNORMAL LOW (ref 80.0–100.0)
Monocytes Absolute: 0.7 10*3/uL (ref 0.1–1.0)
Monocytes Relative: 7 %
Neutro Abs: 5.5 10*3/uL (ref 1.7–7.7)
Neutrophils Relative %: 56 %
Platelets: 336 10*3/uL (ref 150–400)
RBC: 5.41 MIL/uL — ABNORMAL HIGH (ref 3.87–5.11)
RDW: 14.9 % (ref 11.5–15.5)
WBC: 9.9 10*3/uL (ref 4.0–10.5)
nRBC: 0 % (ref 0.0–0.2)

## 2022-08-28 LAB — IRON AND TIBC
Iron: 100 ug/dL (ref 28–170)
Saturation Ratios: 23 % (ref 10.4–31.8)
TIBC: 434 ug/dL (ref 250–450)
UIBC: 334 ug/dL

## 2022-08-28 LAB — FERRITIN: Ferritin: 8 ng/mL — ABNORMAL LOW (ref 11–307)

## 2022-08-30 ENCOUNTER — Inpatient Hospital Stay (HOSPITAL_BASED_OUTPATIENT_CLINIC_OR_DEPARTMENT_OTHER): Payer: Managed Care, Other (non HMO) | Admitting: Oncology

## 2022-08-30 ENCOUNTER — Encounter: Payer: Self-pay | Admitting: Oncology

## 2022-08-30 DIAGNOSIS — D500 Iron deficiency anemia secondary to blood loss (chronic): Secondary | ICD-10-CM

## 2022-08-30 NOTE — Assessment & Plan Note (Signed)
#  Iron deficiency anemia, Labs reviewed and discussed with patient. Ferritin is 8 TIBC 434, iron saturation 11 Hemoglobin 12, Patient previously tolerated Venofer.  Concerned about cost. Continue oral iron vitamin C 65 mg daily.    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

## 2022-08-30 NOTE — Addendum Note (Signed)
Addended by: Drue Dun on: 08/30/2022 01:23 PM   Modules accepted: Orders

## 2022-08-30 NOTE — Progress Notes (Signed)
HEMATOLOGY-ONCOLOGY TeleHEALTH VISIT PROGRESS NOTE  I connected with Kim Reeves on 08/30/22 at  2:30 PM EDT by video enabled telemedicine visit and verified that I am speaking with the correct person using two identifiers. I discussed the limitations, risks, security and privacy concerns of performing an evaluation and management service by telemedicine and the availability of in-person appointments. I also discussed with the patient that there may be a patient responsible charge related to this service. The patient expressed understanding and agreed to proceed.   Other persons participating in the visit and their role in the encounter:  None  Patient's location: home Provider's location: office Chief Complaint: follow up for anemia.    INTERVAL HISTORY Kim Reeves is a 31 y.o. female who has above history reviewed by me today presents for follow up visit for management of anemia, thalassemia minor.  I attempted to connect the patient for visual enabled telehealth visit.  Due to the technical difficulties with video,  Patient was transitioned to audio only visit.  Review of Systems  Constitutional:  Negative for appetite change, chills and fever.  HENT:   Negative for hearing loss and voice change.   Eyes:  Negative for eye problems.  Respiratory:  Negative for chest tightness and cough.   Cardiovascular:  Negative for chest pain.  Gastrointestinal:  Negative for abdominal distention, abdominal pain and blood in stool.  Endocrine: Negative for hot flashes.  Genitourinary:  Negative for difficulty urinating and frequency.   Musculoskeletal:  Negative for arthralgias.  Skin:  Negative for itching and rash.  Neurological:  Negative for extremity weakness.  Hematological:  Negative for adenopathy.  Psychiatric/Behavioral:  Negative for confusion.     Past Medical History:  Diagnosis Date   Anemia    Chlamydia    Family history of breast cancer    12/20 cancer genetic tesing letter  sent   Family history of breast cancer    Family history of colon cancer    Family history of lung cancer    Family history of pancreatic cancer    Family history of throat cancer    GAD (generalized anxiety disorder)    Gonorrhea    Hypertension    Major depressive disorder, single episode, severe without psychotic features ([place]) 01/25/2016   Past Surgical History:  Procedure Laterality Date   NO PAST SURGERIES      Family History  Problem Relation Age of Onset   Colon cancer Maternal Grandmother 60   Breast cancer Maternal Grandmother 70   Breast cancer Paternal Aunt 32   Colon cancer Maternal Aunt 52   Pancreatic cancer Maternal Aunt    Breast cancer Cousin 41    Social History   Socioeconomic History   Marital status: Single    Spouse name: Not on file   Number of children: 0   Years of education: Not on file   Highest education level: Not on file  Occupational History   Not on file  Tobacco Use   Smoking status: Never   Smokeless tobacco: Never  Vaping Use   Vaping Use: Never used  Substance and Sexual Activity   Alcohol use: Yes    Comment: socially   Drug use: Not Currently    Types: Other-see comments    Comment: Etables    Sexual activity: Yes    Birth control/protection: Condom  Other Topics Concern   Not on file  Social History Narrative   Originally from [place] to  apply to [employer] for Women's studies      Works as Scientist, research (medical)      Recent break up 10/2016      Social Determinants of Radio broadcast assistant Strain: Not on file  Food Insecurity: Not on file  Transportation Needs: Not on file  Physical Activity: Not on file  Stress: Not on file  Social Connections: Not on file  Intimate Partner Violence: Not on file    Current Outpatient Medications on File Prior to Visit  Medication Sig Dispense Refill   Clobetasol Propionate 0.05 % shampoo Apply 1 Application topically as directed. Every other week 118 mL 6    hydrochlorothiazide (MICROZIDE) 12.5 MG capsule Take 1 capsule (12.5 mg total) by mouth daily as needed. For BP >130/80. 90 capsule 2   hydrocortisone 2.5 % cream Apply topically at bedtime. Tuesday, Thursday, Saturday 30 g 11   Iron-Vitamin C 65-125 MG TABS Take 1 tablet by mouth daily. 30 tablet 4   ketoconazole (NIZORAL) 2 % cream Apply 1 Application topically at bedtime. Monday, Wednesday, Friday 60 g 11   meloxicam (MOBIC) 7.5 MG tablet Take 1 tablet (7.5 mg total) by mouth daily as needed for pain. 90 tablet 1   No current facility-administered medications on file prior to visit.    No Known Allergies     Observations/Objective: There were no vitals filed for this visit.  There is no height or weight on file to calculate BMI.  Physical Exam Constitutional:      General: She is not in acute distress. Neurological:     Mental Status: She is alert.        Latest Ref Rng & Units 08/28/2022    2:02 PM 04/24/2022    1:43 PM 10/24/2021    1:43 PM  CBC  WBC 4.0 - 10.5 K/uL 9.9  10.2  10.5   Hemoglobin 12.0 - 15.0 g/dL 12.6  11.0  12.0   Hematocrit 36.0 - 46.0 % 38.6  35.2  38.0   Platelets 150 - 400 K/uL 336  362  311       Latest Ref Rng & Units 12/28/2021    3:42 PM 01/11/2021   10:41 AM 10/27/2020   11:14 AM  CMP  Glucose 70 - 99 mg/dL 103  105  86   BUN 6 - 20 mg/dL 12  10  10    Creatinine 0.57 - 1.00 mg/dL 1.03  0.92  0.83   Sodium 134 - 144 mmol/L 140  136  140   Potassium 3.5 - 5.2 mmol/L 3.9  4.0  4.6   Chloride 96 - 106 mmol/L 103  102  104   CO2 20 - 29 mmol/L 26  26  25    Calcium 8.7 - 10.2 mg/dL 9.1  8.6  9.2   Total Protein 6.0 - 8.5 g/dL 7.1  6.9    Total Bilirubin 0.0 - 1.2 mg/dL 0.4  0.6    Alkaline Phos 44 - 121 IU/L 66  46    AST 0 - 40 IU/L 13  29    ALT 0 - 32 IU/L 11  19     Lab Results  Component Value Date   IRON 100 08/28/2022   TIBC 434 08/28/2022   FERRITIN 8 (L) 08/28/2022     ASSESSMENT & PLAN:   Iron deficiency anemia due to chronic  blood loss #Iron deficiency anemia, Labs reviewed and discussed with patient. Ferritin is 8 TIBC 434, iron saturation 11  Hemoglobin 12, Patient previously tolerated Venofer.  Concerned about cost. Continue oral iron vitamin C 65 mg daily.    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  No orders of the defined types were placed in this encounter.  Follow up 6 months.  All questions were answered. The patient knows to call the clinic with any problems, questions or concerns.  Earlie Server, MD, PhD [facility] Hematology Oncology 08/30/2022

## 2022-10-18 ENCOUNTER — Ambulatory Visit: Payer: Managed Care, Other (non HMO) | Admitting: Dermatology

## 2022-10-18 DIAGNOSIS — L408 Other psoriasis: Secondary | ICD-10-CM

## 2022-10-18 DIAGNOSIS — Z79899 Other long term (current) drug therapy: Secondary | ICD-10-CM | POA: Diagnosis not present

## 2022-10-18 MED ORDER — KETOCONAZOLE 2 % EX SHAM: 1.0000 | MEDICATED_SHAMPOO | CUTANEOUS | 6 refills | Status: DC

## 2022-10-18 MED ORDER — CLOBETASOL PROPIONATE 0.05 % EX SHAM: 1.0000 | MEDICATED_SHAMPOO | CUTANEOUS | 6 refills | Status: DC

## 2022-10-18 NOTE — Patient Instructions (Signed)
Due to recent changes in healthcare laws, you may see results of your pathology and/or laboratory studies on MyChart before the doctors have had a chance to review them. We understand that in some cases there may be results that are confusing or concerning to you. Please understand that not all results are received at the same time and often the doctors may need to interpret multiple results in order to provide you with the best plan of care or course of treatment. Therefore, we ask that you please give us 2 business days to thoroughly review all your results before contacting the office for clarification. Should we see a critical lab result, you will be contacted sooner.   If You Need Anything After Your Visit  If you have any questions or concerns for your doctor, please call our main line at [phone] and press option 4 to reach your doctor's medical assistant. If no one answers, please leave a voicemail as directed and we will return your call as soon as possible. Messages left after 4 pm will be answered the following business day.   You may also send us a message via MyChart. We typically respond to MyChart messages within 1-2 business days.  For prescription refills, please ask your pharmacy to contact our office. Our fax number is [phone].  If you have an urgent issue when the clinic is closed that cannot wait until the next business day, you can page your doctor at the number below.    Please note that while we do our best to be available for urgent issues outside of office hours, we are not available 24/7.   If you have an urgent issue and are unable to reach us, you may choose to seek medical care at your doctor's office, retail clinic, urgent care center, or emergency room.  If you have a medical emergency, please immediately call 911 or go to the emergency department.  Pager Numbers  - Dr. Kowalski: [phone]  - Dr. Moye: [phone]  - Dr. Stewart:  [phone]  In the event of inclement weather, please call our main line at [phone] for an update on the status of any delays or closures.  Dermatology Medication Tips: Please keep the boxes that topical medications come in in order to help keep track of the instructions about where and how to use these. Pharmacies typically print the medication instructions only on the boxes and not directly on the medication tubes.   If your medication is too expensive, please contact our office at [phone] option 4 or send us a message through MyChart.   We are unable to tell what your co-pay for medications will be in advance as this is different depending on your insurance coverage. However, we may be able to find a substitute medication at lower cost or fill out paperwork to get insurance to cover a needed medication.   If a prior authorization is required to get your medication covered by your insurance company, please allow us 1-2 business days to complete this process.  Drug prices often vary depending on where the prescription is filled and some pharmacies may offer cheaper prices.  The website www.goodrx.com contains coupons for medications through different pharmacies. The prices here do not account for what the cost may be with help from insurance (it may be cheaper with your insurance), but the website can give you the price if you did not use any insurance.  - You can print the associated coupon and take it with   your prescription to the pharmacy.  - You may also stop by our office during regular business hours and pick up a GoodRx coupon card.  - If you need your prescription sent electronically to a different pharmacy, notify our office through [place] MyChart or by phone at [phone] option 4.     Si Usted Necesita Algo Despus de Su Visita  Tambin puede enviarnos un mensaje a travs de MyChart. Por lo general respondemos a los mensajes de MyChart en el transcurso de 1 a 2  das hbiles.  Para renovar recetas, por favor pida a su farmacia que se ponga en contacto con nuestra oficina. Nuestro nmero de fax es el [phone].  Si tiene un asunto urgente cuando la clnica est cerrada y que no puede esperar hasta el siguiente da hbil, puede llamar/localizar a su doctor(a) al nmero que aparece a continuacin.   Por favor, tenga en cuenta que aunque hacemos todo lo posible para estar disponibles para asuntos urgentes fuera del horario de oficina, no estamos disponibles las 24 horas del da, los 7 das de la semana.   Si tiene un problema urgente y no puede comunicarse con nosotros, puede optar por buscar atencin mdica  en el consultorio de su doctor(a), en una clnica privada, en un centro de atencin urgente o en una sala de emergencias.  Si tiene una emergencia mdica, por favor llame inmediatamente al 911 o vaya a la sala de emergencias.  Nmeros de bper  - Dr. Kowalski: [phone]  - Dra. Moye: [phone]  - Dra. Stewart: [phone]  En caso de inclemencias del tiempo, por favor llame a nuestra lnea principal al [phone] para una actualizacin sobre el estado de cualquier retraso o cierre.  Consejos para la medicacin en dermatologa: Por favor, guarde las cajas en las que vienen los medicamentos de uso tpico para ayudarle a seguir las instrucciones sobre dnde y cmo usarlos. Las farmacias generalmente imprimen las instrucciones del medicamento slo en las cajas y no directamente en los tubos del medicamento.   Si su medicamento es muy caro, por favor, pngase en contacto con nuestra oficina llamando al [phone] y presione la opcin 4 o envenos un mensaje a travs de MyChart.   No podemos decirle cul ser su copago por los medicamentos por adelantado ya que esto es diferente dependiendo de la cobertura de su seguro. Sin embargo, es posible que podamos encontrar un medicamento sustituto a menor costo o llenar un formulario para que el  seguro cubra el medicamento que se considera necesario.   Si se requiere una autorizacin previa para que su compaa de seguros cubra su medicamento, por favor permtanos de 1 a 2 das hbiles para completar este proceso.  Los precios de los medicamentos varan con frecuencia dependiendo del lugar de dnde se surte la receta y alguna farmacias pueden ofrecer precios ms baratos.  El sitio web www.goodrx.com tiene cupones para medicamentos de diferentes farmacias. Los precios aqu no tienen en cuenta lo que podra costar con la ayuda del seguro (puede ser ms barato con su seguro), pero el sitio web puede darle el precio si no utiliz ningn seguro.  - Puede imprimir el cupn correspondiente y llevarlo con su receta a la farmacia.  - Tambin puede pasar por nuestra oficina durante el horario de atencin regular y recoger una tarjeta de cupones de GoodRx.  - Si necesita que su receta se enve electrnicamente a una farmacia diferente, informe a nuestra oficina a travs de MyChart de [place]   o por telfono llamando al [phone] y presione la opcin 4.  

## 2022-10-18 NOTE — Progress Notes (Signed)
   Follow-Up Visit   Subjective  Kim Reeves is a 31 y.o. female who presents for the following: Follow-up (Sebopsoriasis - much better - she washed her hair every 2 weeks and alternates Ketoconazole 2% shampoo with Clobetasol shampoo. She used Ketoconazole 2% cream alternating every other day with Hydrocortisone 2.5% cream but she has stopped since her face was not as bad as her scalp/).  The following portions of the chart were reviewed this encounter and updated as appropriate:   Tobacco  Allergies  Meds  Problems  Med Hx  Surg Hx  Fam Hx     Review of Systems:  No other skin or systemic complaints except as noted in HPI or Assessment and Plan.  Objective  Well appearing patient in no apparent distress; mood and affect are within normal limits.  A focused examination was performed including scalp and face.  Relevant physical exam findings are noted in the Assessment and Plan.  Scalp Clear today   Assessment & Plan  Sebopsoriasis Scalp Chronic and persistent condition with duration or expected duration over one year. Condition is symptomatic / bothersome to patient. Not to goal, but improving.  Clobetasol Propionate 0.05 % shampoo - Scalp Apply 1 Application topically as directed. Every other week  ketoconazole (NIZORAL) 2 % shampoo - Scalp Apply 1 Application topically every 30 (thirty) days.  Return in about 1 year (around 10/19/2023) for Follow up. Documentation: I have reviewed the above documentation for accuracy and completeness, and I agree with the above.  Armida Sans, MD

## 2022-11-06 ENCOUNTER — Encounter: Payer: Self-pay | Admitting: Dermatology

## 2022-11-24 ENCOUNTER — Ambulatory Visit (HOSPITAL_BASED_OUTPATIENT_CLINIC_OR_DEPARTMENT_OTHER)
Admission: RE | Admit: 2022-11-24 | Payer: Managed Care, Other (non HMO) | Source: Home / Self Care | Admitting: Obstetrics and Gynecology

## 2022-11-24 ENCOUNTER — Encounter (HOSPITAL_BASED_OUTPATIENT_CLINIC_OR_DEPARTMENT_OTHER): Admission: RE | Payer: Self-pay | Source: Home / Self Care

## 2022-11-24 SURGERY — DILATATION & CURETTAGE/HYSTEROSCOPY WITH MYOSURE
Anesthesia: General

## 2022-11-29 ENCOUNTER — Telehealth: Payer: Managed Care, Other (non HMO) | Admitting: Physician Assistant

## 2022-11-29 DIAGNOSIS — R3989 Other symptoms and signs involving the genitourinary system: Secondary | ICD-10-CM

## 2022-11-29 MED ORDER — CEPHALEXIN 500 MG PO CAPS: 500.0000 mg | ORAL_CAPSULE | Freq: Two times a day (BID) | ORAL | 0 refills | Status: DC

## 2022-11-29 NOTE — Progress Notes (Signed)

## 2022-12-08 DIAGNOSIS — N879 Dysplasia of cervix uteri, unspecified: Secondary | ICD-10-CM | POA: Insufficient documentation

## 2023-02-28 ENCOUNTER — Inpatient Hospital Stay: Payer: Managed Care, Other (non HMO) | Attending: Oncology

## 2023-02-28 DIAGNOSIS — D500 Iron deficiency anemia secondary to blood loss (chronic): Secondary | ICD-10-CM | POA: Insufficient documentation

## 2023-02-28 LAB — CBC WITH DIFFERENTIAL/PLATELET
Abs Immature Granulocytes: 0.02 10*3/uL (ref 0.00–0.07)
Basophils Absolute: 0 10*3/uL (ref 0.0–0.1)
Basophils Relative: 0 %
Eosinophils Absolute: 0 10*3/uL (ref 0.0–0.5)
Eosinophils Relative: 0 %
HCT: 36.4 % (ref 36.0–46.0)
Hemoglobin: 11.1 g/dL — ABNORMAL LOW (ref 12.0–15.0)
Immature Granulocytes: 0 %
Lymphocytes Relative: 34 %
Lymphs Abs: 3.2 10*3/uL (ref 0.7–4.0)
MCH: 22.3 pg — ABNORMAL LOW (ref 26.0–34.0)
MCHC: 30.5 g/dL (ref 30.0–36.0)
MCV: 73.1 fL — ABNORMAL LOW (ref 80.0–100.0)
Monocytes Absolute: 0.6 10*3/uL (ref 0.1–1.0)
Monocytes Relative: 6 %
Neutro Abs: 5.6 10*3/uL (ref 1.7–7.7)
Neutrophils Relative %: 60 %
Platelets: 396 10*3/uL (ref 150–400)
RBC: 4.98 MIL/uL (ref 3.87–5.11)
RDW: 14.6 % (ref 11.5–15.5)
WBC: 9.5 10*3/uL (ref 4.0–10.5)
nRBC: 0 % (ref 0.0–0.2)

## 2023-02-28 LAB — IRON AND TIBC
Iron: 36 ug/dL (ref 28–170)
Saturation Ratios: 8 % — ABNORMAL LOW (ref 10.4–31.8)
TIBC: 465 ug/dL — ABNORMAL HIGH (ref 250–450)
UIBC: 429 ug/dL

## 2023-02-28 LAB — FERRITIN: Ferritin: 7 ng/mL — ABNORMAL LOW (ref 11–307)

## 2023-03-07 ENCOUNTER — Encounter: Payer: Self-pay | Admitting: Oncology

## 2023-03-07 ENCOUNTER — Inpatient Hospital Stay: Payer: Managed Care, Other (non HMO) | Attending: Oncology | Admitting: Oncology

## 2023-03-07 VITALS — BP 139/96 | HR 72 | Temp 98.0°F | Resp 18 | Wt 194.0 lb

## 2023-03-07 DIAGNOSIS — D500 Iron deficiency anemia secondary to blood loss (chronic): Secondary | ICD-10-CM | POA: Diagnosis not present

## 2023-03-07 DIAGNOSIS — Z800 Family history of malignant neoplasm of digestive organs: Secondary | ICD-10-CM | POA: Insufficient documentation

## 2023-03-07 DIAGNOSIS — Z803 Family history of malignant neoplasm of breast: Secondary | ICD-10-CM | POA: Diagnosis not present

## 2023-03-07 DIAGNOSIS — D563 Thalassemia minor: Secondary | ICD-10-CM | POA: Diagnosis not present

## 2023-03-07 NOTE — Assessment & Plan Note (Signed)
#  Iron deficiency anemia, Labs reviewed and discussed with patient. Ferritin is 8 TIBC 434, iron saturation 7 Hemoglobin 11, Patient previously tolerated Venofer, however cost is prohibitive. She declines Venofoer.  Recommend oral iron Vitron C 65 mg twice daily- OTC supply.    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

## 2023-03-07 NOTE — Progress Notes (Signed)
Hematology/Oncology Progress note [phone] [phone]        CHIEF COMPLAINTS/REASON FOR VISIT:  Follow up for iron deficiency anemia.    ASSESSMENT & PLAN:   Iron deficiency anemia due to chronic blood loss #Iron deficiency anemia, Labs reviewed and discussed with patient. Ferritin is 8 TIBC 434, iron saturation 7 Hemoglobin 11, Patient previously tolerated Venofer, however cost is prohibitive. She declines Venofoer.  Recommend oral iron Vitron C 65 mg twice daily- OTC supply.    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   Orders Placed This Encounter  Procedures   CBC with Differential ([place] Only)    Standing Status:   Future    Standing Expiration Date:   03/06/2024   Iron and TIBC    Standing Status:   Future    Standing Expiration Date:   03/06/2024   Ferritin    Standing Status:   Future    Standing Expiration Date:   03/06/2024   Retic Panel    Standing Status:   Future    Standing Expiration Date:   03/06/2024   CMP ([place] only)    Standing Status:   Future    Standing Expiration Date:   03/06/2024   Follow up in 4 months.  All questions were answered. The patient knows to call the clinic with any problems, questions or concerns.  Earlie Server, MD, PhD [facility] Hematology Oncology 03/07/2023   HISTORY OF PRESENTING ILLNESS:   Kim Reeves is a  32 y.o.  female presents for follow up of iron deficiency anemia.   She reports "energy levels are pretty good" but acknowledges ongoing iron deficiency with current hemoglobin at 11.1, which is lower than 6 months prior, iron saturation of 8, and ferritin of 7. - Underwent Vaginal wall biopsy and cervix cone surgery 01/30/23  and experienced prolonged bleeding postoperatively. - First menstrual cycle post-surgery was "heavier" than usual, which was expected according to the gynecologist. 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    SURGICAL HISTORY: Past Surgical History:  Procedure Laterality Date   NO PAST SURGERIES      SOCIAL HISTORY: Social History   Socioeconomic History   Marital status: Single    Spouse name: Not on file   Number of children: 0   Years of education: Not on file   Highest education level: Not on file  Occupational History   Not on file  Tobacco Use   Smoking status: Never   Smokeless tobacco: Never  Vaping Use   Vaping Use: Never used  Substance and Sexual Activity   Alcohol use: Yes    Comment: socially   Drug use: Not Currently    Types: Other-see comments    Comment: [place]    Sexual activity: Yes    Birth control/protection: Condom  Other Topics Concern  Not on file  Social History Narrative   Originally from [place] to apply to [employer] for [employer] studies      Works as Scientist, research (medical)      Recent break up 10/2016      Social Determinants of Radio broadcast assistant Strain: Not on [employer] Insecurity: Not on file  Transportation Needs: Not on file  Physical Activity: Not on file  Stress: Not on file  Social Connections: Not on file  Intimate Partner Violence: Not on file    FAMILY HISTORY: Family History  Problem Relation Age of Onset   Colon cancer Maternal Grandmother 60   Breast cancer Maternal Grandmother 60   Breast cancer Paternal Aunt 14   Colon cancer Maternal Aunt 52   Pancreatic cancer  Maternal Aunt    Breast cancer Cousin 26    ALLERGIES:  has No Known Allergies.  MEDICATIONS:  Current Outpatient Medications  Medication Sig Dispense Refill   Clobetasol Propionate 0.05 % shampoo Apply 1 Application topically as directed. Every other week 118 mL 6   hydrochlorothiazide (MICROZIDE) 12.5 MG capsule Take 1 capsule (12.5 mg total) by mouth daily as needed. For BP >130/80. 90 capsule 2   Iron-Vitamin C 65-125 MG TABS Take 1 tablet by mouth daily. 30 tablet 4   ketoconazole (NIZORAL) 2 % shampoo Apply 1 Application topically every 30 (thirty) days. 120 mL 6   meloxicam (MOBIC) 7.5 MG tablet Take 1 tablet (7.5 mg total) by mouth daily as needed for pain. 90 tablet 1   cephALEXin (KEFLEX) 500 MG capsule Take 1 capsule (500 mg total) by mouth 2 (two) times daily. (Patient not taking: Reported on 03/07/2023) 14 capsule 0   No current facility-administered medications for this visit.    Review of Systems  Constitutional:  Negative for appetite change, chills, fatigue and fever.  HENT:   Negative for hearing loss and voice change.   Eyes:  Negative for eye problems.  Respiratory:  Negative for chest tightness and cough.   Cardiovascular:  Negative for chest pain.  Gastrointestinal:  Negative for abdominal distention, abdominal pain and blood in stool.  Endocrine: Negative for hot flashes.  Genitourinary:  Negative for difficulty urinating and frequency.   Musculoskeletal:  Negative for arthralgias.  Skin:  Negative for itching and rash.  Neurological:  Negative for extremity weakness.  Hematological:  Negative for adenopathy.  Psychiatric/Behavioral:  Negative for confusion.    PHYSICAL EXAMINATION:  Vitals:   03/07/23 1345  BP: (!) 139/96  Pulse: 72  Resp: 18  Temp: 98 F (36.7 C)  SpO2: 100%   Filed Weights   03/07/23 1345  Weight: 194 lb (88 kg)    Physical Exam Constitutional:      General: She is not in acute distress. HENT:     Head: Normocephalic and  atraumatic.  Eyes:     General: No scleral icterus. Cardiovascular:     Rate and Rhythm: Normal rate.  Pulmonary:     Effort: Pulmonary effort is normal. No respiratory distress.     Breath sounds: No wheezing.  Abdominal:     General: Bowel sounds are normal. There is no distension.     Palpations: Abdomen is soft.  Musculoskeletal:        General: No deformity. Normal range of motion.     Cervical back: Normal range of motion and neck supple.  Skin:    General: Skin is warm and dry.  Findings: No erythema or rash.  Neurological:     Mental Status: She is alert and oriented to person, place, and time. Mental status is at baseline.     Cranial Nerves: No cranial nerve deficit.  Psychiatric:        Mood and Affect: Mood normal.     LABORATORY DATA:  I have reviewed the data as listed    Latest Ref Rng & Units 02/28/2023   11:34 AM 08/28/2022    2:02 PM 04/24/2022    1:43 PM  CBC  WBC 4.0 - 10.5 K/uL 9.5  9.9  10.2   Hemoglobin 12.0 - 15.0 g/dL 11.1  12.6  11.0   Hematocrit 36.0 - 46.0 % 36.4  38.6  35.2   Platelets 150 - 400 K/uL 396  336  362       Latest Ref Rng & Units 12/28/2021    3:42 PM 01/11/2021   10:41 AM 10/27/2020   11:14 AM  CMP  Glucose 70 - 99 mg/dL 103  105  86   BUN 6 - 20 mg/dL 12  10  10    Creatinine 0.57 - 1.00 mg/dL 1.03  0.92  0.83   Sodium 134 - 144 mmol/L 140  136  140   Potassium 3.5 - 5.2 mmol/L 3.9  4.0  4.6   Chloride 96 - 106 mmol/L 103  102  104   CO2 20 - 29 mmol/L 26  26  25    Calcium 8.7 - 10.2 mg/dL 9.1  8.6  9.2   Total Protein 6.0 - 8.5 g/dL 7.1  6.9    Total Bilirubin 0.0 - 1.2 mg/dL 0.4  0.6    Alkaline Phos 44 - 121 IU/L 66  46    AST 0 - 40 IU/L 13  29    ALT 0 - 32 IU/L 11  19        RADIOGRAPHIC STUDIES: I have personally reviewed the radiological images as listed and agreed with the findings in the report. No results found.

## 2023-03-23 ENCOUNTER — Ambulatory Visit: Payer: Managed Care, Other (non HMO) | Admitting: Family

## 2023-03-23 ENCOUNTER — Encounter: Payer: Self-pay | Admitting: Family

## 2023-03-23 VITALS — BP 146/82 | HR 76 | Temp 98.2°F | Ht 67.5 in | Wt 194.4 lb

## 2023-03-23 DIAGNOSIS — I10 Essential (primary) hypertension: Secondary | ICD-10-CM | POA: Diagnosis not present

## 2023-03-23 MED ORDER — HYDROCHLOROTHIAZIDE 25 MG PO TABS
25.0000 mg | ORAL_TABLET | Freq: Every day | ORAL | 3 refills | Status: DC
Start: 2023-03-23 — End: 2024-07-15

## 2023-03-23 NOTE — Patient Instructions (Addendum)
As discussed, increase hydrochlorothiazide to 25 mg taken in the morning. Over the next few days, you should see blood pressure coming down approaching 120/80.  Most certainly if it is not, please let me know as I would consider the addition of losartan.  Nice to see you as always.  You are such an inspiration.

## 2023-03-23 NOTE — Progress Notes (Unsigned)
   Assessment & Plan:  There are no diagnoses linked to this encounter.   Return precautions given.   Risks, benefits, and alternatives of the medications and treatment plan prescribed today were discussed, and patient expressed understanding.   Education regarding symptom management and diagnosis given to patient on AVS either electronically or printed.  No follow-ups on file.  Rennie Plowman, FNP  Subjective:    Patient ID: Kim Reeves, female    DOB: [birth year], 32 y.o.   MRN: [id number]  CC: Kim Reeves is a 32 y.o. female who presents today for follow up.   HPI: Blood  144/99, HR 88, 141/102, HR 81, 147/93, HR 73, 138/82.  Endorses HA  HA arre similar to HA in the past.  HA are biltaeral temporal area.   No IV drug use, decongestants      Continues to follow up following with Dr. Cathie Hoops, hematology for iron deficiency anemia.  Last seen 03/07/2023 Decline venofer and  Dr Cathie Hoops recommended oral iron Vitron C 65 mg twice a day   Allergies: Patient has no known allergies. Current Outpatient Medications on File Prior to Visit  Medication Sig Dispense Refill   Clobetasol Propionate 0.05 % shampoo Apply 1 Application topically as directed. Every other week 118 mL 6   hydrochlorothiazide (MICROZIDE) 12.5 MG capsule Take 1 capsule (12.5 mg total) by mouth daily as needed. For BP >130/80. 90 capsule 2   Iron-Vitamin C 65-125 MG TABS Take 1 tablet by mouth daily. 30 tablet 4   ketoconazole (NIZORAL) 2 % shampoo Apply 1 Application topically every 30 (thirty) days. 120 mL 6   meloxicam (MOBIC) 7.5 MG tablet Take 1 tablet (7.5 mg total) by mouth daily as needed for pain. 90 tablet 1   cephALEXin (KEFLEX) 500 MG capsule Take 1 capsule (500 mg total) by mouth 2 (two) times daily. (Patient not taking: Reported on 03/07/2023) 14 capsule 0   No current facility-administered medications on file prior to visit.    Review of Systems    Objective:    BP (!) 146/82   Pulse 76   Temp 98.2  F (36.8 C) (Oral)   Ht 5' 7.5" (1.715 m)   Wt 194 lb 6.4 oz (88.2 kg)   SpO2 97%   BMI 30.00 kg/m  BP Readings from Last 3 Encounters:  03/23/23 (!) 146/82  03/07/23 (!) 139/96  07/03/22 130/78   Wt Readings from Last 3 Encounters:  03/23/23 194 lb 6.4 oz (88.2 kg)  03/07/23 194 lb (88 kg)  07/03/22 188 lb 6.4 oz (85.5 kg)    Physical Exam

## 2023-03-26 NOTE — Assessment & Plan Note (Addendum)
Suboptimal control.  Increase hydrochlorothiazide to 25 mg daily.  BMP in one week. Consider losartan addition of BP not < 120/80.

## 2023-04-03 ENCOUNTER — Encounter: Payer: Self-pay | Admitting: Family

## 2023-04-04 ENCOUNTER — Encounter: Payer: Self-pay | Admitting: Family

## 2023-04-04 ENCOUNTER — Ambulatory Visit: Payer: Managed Care, Other (non HMO) | Admitting: Family

## 2023-04-04 VITALS — BP 122/88 | HR 97 | Temp 98.9°F | Ht 67.5 in | Wt 194.4 lb

## 2023-04-04 DIAGNOSIS — I10 Essential (primary) hypertension: Secondary | ICD-10-CM | POA: Diagnosis not present

## 2023-04-04 DIAGNOSIS — J029 Acute pharyngitis, unspecified: Secondary | ICD-10-CM | POA: Diagnosis not present

## 2023-04-04 LAB — POCT RAPID STREP A (OFFICE): Rapid Strep A Screen: NEGATIVE

## 2023-04-04 NOTE — Patient Instructions (Signed)
Sore Throat A sore throat is pain, burning, irritation, or scratchiness in the throat. When you have a sore throat, you may feel pain or tenderness in your throat when you swallow or talk. Many things can cause a sore throat, including: An infection. Seasonal allergies. Dryness in the air. Irritants, such as smoke or pollution. Radiation treatment for cancer. Gastroesophageal reflux disease (GERD). A tumor. A sore throat is often the first sign of another sickness. It may happen with other symptoms, such as coughing, sneezing, fever, and swollen neck glands. Most sore throats go away without medical treatment. Follow these instructions at home:     Medicines Take over-the-counter and prescription medicines only as told by your health care provider. Children often get sore throats. Do not give your child aspirin because of the association with Reye's syndrome. Use throat sprays to soothe your throat as told by your health care provider. Managing pain To help with pain, try: Sipping warm liquids, such as broth, herbal tea, or warm water. Eating or drinking cold or frozen liquids, such as frozen ice pops. Gargling with a mixture of salt and water 3-4 times a day or as needed. To make salt water, completely dissolve -1 tsp (3-6 g) of salt in 1 cup (237 mL) of warm water. Sucking on hard candy or throat lozenges. Putting a cool-mist humidifier in your bedroom at night to moisten the air. Sitting in the bathroom with the door closed for 5-10 minutes while you run hot water in the shower. General instructions Do not use any products that contain nicotine or tobacco. These products include cigarettes, chewing tobacco, and vaping devices, such as e-cigarettes. If you need help quitting, ask your health care provider. Rest as needed. Drink enough fluid to keep your urine pale yellow. Wash your hands often with soap and water for at least 20 seconds. If soap and water are not available, use hand  sanitizer. Contact a health care provider if: You have a fever for more than 2-3 days. You have symptoms that last for more than 2-3 days. Your throat does not get better within 7 days. You have a fever and your symptoms suddenly get worse. Get help right away if: You have difficulty breathing. You cannot swallow fluids, soft foods, or your saliva. You have increased swelling in your throat or neck. You have persistent nausea and vomiting. These symptoms may represent a serious problem that is an emergency. Do not wait to see if the symptoms will go away. Get medical help right away. Call your local emergency services (911 in the [place] Do not drive yourself to the hospital. Summary A sore throat is pain, burning, irritation, or scratchiness in the throat. Many things can cause a sore throat. Take over-the-counter medicines only as told by your health care provider. Rest as needed. Drink enough fluid to keep your urine pale yellow. Contact a health care provider if your throat does not get better within 7 days. This information is not intended to replace advice given to you by your health care provider. Make sure you discuss any questions you have with your health care provider. Document Revised: 02/16/2021 Document Reviewed: 02/16/2021 Elsevier Patient Education  2023 Elsevier Inc.  

## 2023-04-04 NOTE — Progress Notes (Signed)
Assessment & Plan:  Sore throat -     POCT rapid strep A -     Culture, Group A Strep  Hypertension, unspecified type -     Basic metabolic panel  Pharyngitis, unspecified etiology Assessment & Plan: Afebrile.  Duration 3 days.  Discussed with patient more likely viral pharyngitis.  Point-of-care strep is negative.  Obtained strep culture to ensure.  Advised Tylenol for pain, honey.  Patient will let me know how she is doing and if symptom persist      Return precautions given.   Risks, benefits, and alternatives of the medications and treatment plan prescribed today were discussed, and patient expressed understanding.   Education regarding symptom management and diagnosis given to patient on AVS either electronically or printed.  No follow-ups on file.  Rennie Plowman, FNP  Subjective:    Patient ID: Kim Reeves, female    DOB: 1991/08/02, 32 y.o.   MRN: [id number]  CC: Kim Reeves is a 32 y.o. female who presents today for an acute visit.    HPI: Complains bilateral sore throat pain x 3 days ago  Ears itching. Very little sneezing.  No cough, fever, spb, wheezing  Exposure to strep from roommate She has been using honey and tea       Allergies: Patient has no known allergies. Current Outpatient Medications on File Prior to Visit  Medication Sig Dispense Refill   Clobetasol Propionate 0.05 % shampoo Apply 1 Application topically as directed. Every other week 118 mL 6   hydrochlorothiazide (HYDRODIURIL) 25 MG tablet Take 1 tablet (25 mg total) by mouth daily. 90 tablet 3   Iron-Vitamin C 65-125 MG TABS Take 1 tablet by mouth daily. 30 tablet 4   ketoconazole (NIZORAL) 2 % shampoo Apply 1 Application topically every 30 (thirty) days. 120 mL 6   meloxicam (MOBIC) 7.5 MG tablet Take 1 tablet (7.5 mg total) by mouth daily as needed for pain. 90 tablet 1   No current facility-administered medications on file prior to visit.    Review of Systems  Constitutional:   Negative for chills and fever.  HENT:  Positive for sore throat. Negative for congestion, sinus pain and trouble swallowing.   Respiratory:  Negative for cough and shortness of breath.   Cardiovascular:  Negative for chest pain and palpitations.  Gastrointestinal:  Negative for nausea and vomiting.      Objective:    BP 122/88   Pulse 97   Temp 98.9 F (37.2 C) (Oral)   Ht 5' 7.5" (1.715 m)   Wt 194 lb 6.4 oz (88.2 kg)   LMP  (LMP Unknown)   SpO2 99%   BMI 30.00 kg/m   BP Readings from Last 3 Encounters:  04/04/23 122/88  03/23/23 (!) 146/82  03/07/23 (!) 139/96   Wt Readings from Last 3 Encounters:  04/04/23 194 lb 6.4 oz (88.2 kg)  03/23/23 194 lb 6.4 oz (88.2 kg)  03/07/23 194 lb (88 kg)    Physical Exam Vitals reviewed.  Constitutional:      Appearance: She is well-developed.  HENT:     Head: Normocephalic and atraumatic.     Right Ear: Hearing, tympanic membrane, ear canal and external ear normal. No decreased hearing noted. No drainage, swelling or tenderness. No middle ear effusion. No foreign body. Tympanic membrane is not erythematous or bulging.     Left Ear: Hearing, tympanic membrane, ear canal and external ear normal. No decreased hearing noted. No drainage, swelling or tenderness.  No middle ear effusion. No foreign body. Tympanic membrane is not erythematous or bulging.     Nose: Nose normal. No rhinorrhea.     Right Sinus: No maxillary sinus tenderness or frontal sinus tenderness.     Left Sinus: No maxillary sinus tenderness or frontal sinus tenderness.     Mouth/Throat:     Pharynx: Uvula midline. Posterior oropharyngeal erythema present. No oropharyngeal exudate.     Tonsils: No tonsillar abscesses.     Comments: tonsils well behind pilars.  No white exudate Eyes:     Conjunctiva/sclera: Conjunctivae normal.  Cardiovascular:     Rate and Rhythm: Regular rhythm.     Pulses: Normal pulses.     Heart sounds: Normal heart sounds.  Pulmonary:      Effort: Pulmonary effort is normal.     Breath sounds: Normal breath sounds. No wheezing, rhonchi or rales.  Lymphadenopathy:     Head:     Right side of head: No submental, submandibular, tonsillar, preauricular, posterior auricular or occipital adenopathy.     Left side of head: No submental, submandibular, tonsillar, preauricular, posterior auricular or occipital adenopathy.     Cervical: No cervical adenopathy.  Skin:    General: Skin is warm and dry.  Neurological:     Mental Status: She is alert.  Psychiatric:        Speech: Speech normal.        Behavior: Behavior normal.        Thought Content: Thought content normal.

## 2023-04-04 NOTE — Assessment & Plan Note (Signed)
Afebrile.  Duration 3 days.  Discussed with patient more likely viral pharyngitis.  Point-of-care strep is negative.  Obtained strep culture to ensure.  Advised Tylenol for pain, honey.  Patient will let me know how she is doing and if symptom persist

## 2023-04-05 LAB — BASIC METABOLIC PANEL
BUN/Creatinine Ratio: 11 (ref 9–23)
BUN: 10 mg/dL (ref 6–20)
CO2: 23 mmol/L (ref 20–29)
Calcium: 9.9 mg/dL (ref 8.7–10.2)
Chloride: 101 mmol/L (ref 96–106)
Creatinine, Ser: 0.91 mg/dL (ref 0.57–1.00)
Glucose: 82 mg/dL (ref 70–99)
Potassium: 4.6 mmol/L (ref 3.5–5.2)
Sodium: 141 mmol/L (ref 134–144)
eGFR: 85 mL/min/{1.73_m2} (ref >59)

## 2023-04-06 LAB — CULTURE, GROUP A STREP: Strep A Culture: NEGATIVE

## 2023-04-19 ENCOUNTER — Encounter: Payer: Self-pay | Admitting: *Deleted

## 2023-07-02 ENCOUNTER — Inpatient Hospital Stay: Payer: Managed Care, Other (non HMO) | Attending: Oncology

## 2023-07-02 DIAGNOSIS — D500 Iron deficiency anemia secondary to blood loss (chronic): Secondary | ICD-10-CM | POA: Diagnosis not present

## 2023-07-02 LAB — CMP (CANCER CENTER ONLY)
ALT: 16 U/L (ref 0–44)
AST: 20 U/L (ref 15–41)
Albumin: 3.9 g/dL (ref 3.5–5.0)
Alkaline Phosphatase: 59 U/L (ref 38–126)
Anion gap: 8 (ref 5–15)
BUN: 11 mg/dL (ref 6–20)
CO2: 23 mmol/L (ref 22–32)
Calcium: 9.1 mg/dL (ref 8.9–10.3)
Chloride: 108 mmol/L (ref 98–111)
Creatinine: 0.84 mg/dL (ref 0.44–1.00)
GFR, Estimated: >60 mL/min (ref >60)
Glucose, Bld: 88 mg/dL (ref 70–99)
Potassium: 4 mmol/L (ref 3.5–5.1)
Sodium: 139 mmol/L (ref 135–145)
Total Bilirubin: 0.6 mg/dL (ref 0.3–1.2)
Total Protein: 7.5 g/dL (ref 6.5–8.1)

## 2023-07-02 LAB — CBC WITH DIFFERENTIAL (CANCER CENTER ONLY)
Abs Immature Granulocytes: 0.08 10*3/uL — ABNORMAL HIGH (ref 0.00–0.07)
Basophils Absolute: 0 10*3/uL (ref 0.0–0.1)
Basophils Relative: 0 %
Eosinophils Absolute: 0 10*3/uL (ref 0.0–0.5)
Eosinophils Relative: 0 %
HCT: 37.4 % (ref 36.0–46.0)
Hemoglobin: 11.6 g/dL — ABNORMAL LOW (ref 12.0–15.0)
Immature Granulocytes: 1 %
Lymphocytes Relative: 27 %
Lymphs Abs: 3.3 10*3/uL (ref 0.7–4.0)
MCH: 22.3 pg — ABNORMAL LOW (ref 26.0–34.0)
MCHC: 31 g/dL (ref 30.0–36.0)
MCV: 71.8 fL — ABNORMAL LOW (ref 80.0–100.0)
Monocytes Absolute: 0.9 10*3/uL (ref 0.1–1.0)
Monocytes Relative: 8 %
Neutro Abs: 7.9 10*3/uL — ABNORMAL HIGH (ref 1.7–7.7)
Neutrophils Relative %: 64 %
Platelet Count: 318 10*3/uL (ref 150–400)
RBC: 5.21 MIL/uL — ABNORMAL HIGH (ref 3.87–5.11)
RDW: 14.8 % (ref 11.5–15.5)
WBC Count: 12.2 10*3/uL — ABNORMAL HIGH (ref 4.0–10.5)
nRBC: 0 % (ref 0.0–0.2)

## 2023-07-02 LAB — IRON AND TIBC
Iron: 85 ug/dL (ref 28–170)
Saturation Ratios: 18 % (ref 10.4–31.8)
TIBC: 465 ug/dL — ABNORMAL HIGH (ref 250–450)
UIBC: 380 ug/dL

## 2023-07-02 LAB — RETIC PANEL
Immature Retic Fract: 11 % (ref 2.3–15.9)
RBC.: 5.19 MIL/uL — ABNORMAL HIGH (ref 3.87–5.11)
Retic Count, Absolute: 88.7 10*3/uL (ref 19.0–186.0)
Retic Ct Pct: 1.7 % (ref 0.4–3.1)
Reticulocyte Hemoglobin: 23.5 pg — ABNORMAL LOW (ref >27.9)

## 2023-07-02 LAB — FERRITIN: Ferritin: 7 ng/mL — ABNORMAL LOW (ref 11–307)

## 2023-07-04 ENCOUNTER — Encounter: Payer: Self-pay | Admitting: Family

## 2023-07-04 ENCOUNTER — Ambulatory Visit (INDEPENDENT_AMBULATORY_CARE_PROVIDER_SITE_OTHER): Payer: Managed Care, Other (non HMO) | Admitting: Family

## 2023-07-04 ENCOUNTER — Other Ambulatory Visit (HOSPITAL_COMMUNITY)
Admission: RE | Admit: 2023-07-04 | Discharge: 2023-07-04 | Disposition: A | Discharge status: Home / Self Care | Payer: Managed Care, Other (non HMO) | Source: Ambulatory Visit | Attending: Family | Admitting: Family

## 2023-07-04 VITALS — BP 122/76 | HR 79 | Temp 98.7°F | Ht 67.5 in | Wt 198.2 lb

## 2023-07-04 DIAGNOSIS — R879 Unspecified abnormal finding in specimens from female genital organs: Secondary | ICD-10-CM | POA: Insufficient documentation

## 2023-07-04 DIAGNOSIS — Z0000 Encounter for general adult medical examination without abnormal findings: Secondary | ICD-10-CM | POA: Diagnosis not present

## 2023-07-04 DIAGNOSIS — D563 Thalassemia minor: Secondary | ICD-10-CM | POA: Diagnosis not present

## 2023-07-04 DIAGNOSIS — R87810 Cervical high risk human papillomavirus (HPV) DNA test positive: Secondary | ICD-10-CM

## 2023-07-04 DIAGNOSIS — R87610 Atypical squamous cells of undetermined significance on cytologic smear of cervix (ASC-US): Secondary | ICD-10-CM | POA: Insufficient documentation

## 2023-07-04 NOTE — Progress Notes (Signed)
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 Clifton James.  Will follow   Routine physical examination Assessment & Plan: Clinical breast exam performed today.  Reiterated the importance of self breast exam at home particularly in the setting of family history.  Deferred pelvic exam as patient is following with oncology at this time.  Patient declines tetanus.      Return precautions given.   Risks, benefits, and alternatives of the medications and treatment plan prescribed today were discussed, and patient expressed understanding.   Education regarding symptom management and diagnosis given to patient on AVS either electronically or printed.  Return in about 1 year (around 07/03/2024) for Complete Physical Exam.  Rennie Plowman, FNP  Subjective:    Patient ID: Kim Reeves, female    DOB: 03/26/1991, 32 y.o.   MRN: [id number]  CC: Kim Reeves is a 32 y.o. female who presents today for physical exam.    HPI: Feels well today.  No new complaints; she does have a new sexual partner. She is monogamous.  She would like STD screening.  Denies changes to vaginal discharge   Continues to follow with Dr. 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  Last seen 03/07/2023.  Compliant with oral iron Vitron C  No first-degree relative with colon cancer or breast cancer.  She does have family history of colon cancer and breast cancer.  She has had genetic testing, negative, in the past.   Cervical Cancer Screening: Previously seen GYN Dr Tiburcio Pea, 12/21/21 pap ASC-US, positive HPV.Per Dr Tiburcio Pea, PAP is still low grade abnormal, but actually improved from the last PAP that was abnormal.  We can continue to monitor with PAP every 6 months.  The alternative option is to have cryo  (freezing) procedure to cervix, to more quickly eradicate this concern.    S/p cervical biopsy 01/30/23 Dr Clifton James oncology [facility] with benign squamous epithelium, cervix low-grade squamous intraepithelial lesion, mild squamous dysplasia CIN-1.  She is scheduled for repeat Pap smear September 20, 2023 with Dr Clifton James oncology.   Bone Health screening/DEXA for 65+: No increased fracture risk. Defer screening at this time.        Tetanus - due; she declines         Exercise: Gets regular exercise.   Alcohol use: Occasional Smoking/tobacco use: Nonsmoker.    Health Maintenance  Topic Date Due   DTaP/Tdap/Td vaccine (8 - Td or Tdap) 06/29/2016   COVID-19 Vaccine (4 - 2023-24 season) 08/04/2022   Flu Shot  07/05/2023   Pap Smear  12/21/2024   Hepatitis C Screening  Completed   HIV Screening  Completed   HPV Vaccine  Aged Out    ALLERGIES: Patient has no known allergies.  Current Outpatient Medications on File Prior to Visit  Medication Sig Dispense Refill   Clobetasol Propionate 0.05 % shampoo Apply 1 Application topically as directed. Every other week 118 mL 6   hydrochlorothiazide (HYDRODIURIL) 25 MG tablet Take 1 tablet (25 mg total) by mouth daily. 90 tablet 3   Iron-Vitamin C 65-125 MG TABS Take 1 tablet by mouth daily. 30 tablet 4   ketoconazole (NIZORAL) 2 % shampoo Apply 1 Application topically every 30 (thirty) days. 120 mL 6   meloxicam (MOBIC) 7.5 MG tablet Take 1 tablet (7.5 mg  total) by mouth daily as needed for pain. 90 tablet 1   No current facility-administered medications on file prior to visit.    Review of Systems  Constitutional:  Negative for chills, fever and unexpected weight change.  HENT:  Negative for congestion.   Respiratory:  Negative for cough.   Cardiovascular:  Negative for chest pain, palpitations and leg swelling.  Gastrointestinal:  Negative for nausea and vomiting.  Genitourinary:  Negative for pelvic pain and vaginal discharge.   Musculoskeletal:  Negative for arthralgias and myalgias.  Skin:  Negative for rash.  Neurological:  Negative for headaches.  Hematological:  Negative for adenopathy.  Psychiatric/Behavioral:  Negative for confusion.       Objective:    BP 122/76   Pulse 79   Temp 98.7 F (37.1 C)   Ht 5' 7.5" (1.715 m)   Wt 198 lb 3.2 oz (89.9 kg)   SpO2 98%   BMI 30.58 kg/m   BP Readings from Last 3 Encounters:  07/04/23 122/76  04/04/23 122/88  03/23/23 (!) 146/82   Wt Readings from Last 3 Encounters:  07/04/23 198 lb 3.2 oz (89.9 kg)  04/04/23 194 lb 6.4 oz (88.2 kg)  03/23/23 194 lb 6.4 oz (88.2 kg)    Physical Exam Vitals reviewed.  Constitutional:      Appearance: Normal appearance. She is well-developed.  Eyes:     Conjunctiva/sclera: Conjunctivae normal.  Neck:     Thyroid: No thyroid mass or thyromegaly.  Cardiovascular:     Rate and Rhythm: Normal rate and regular rhythm.     Pulses: Normal pulses.     Heart sounds: Normal heart sounds.  Pulmonary:     Effort: Pulmonary effort is normal.     Breath sounds: Normal breath sounds. No wheezing, rhonchi or rales.  Chest:  Breasts:    Breasts are symmetrical.     Right: No inverted nipple, mass, nipple discharge, skin change or tenderness.     Left: No inverted nipple, mass, nipple discharge, skin change or tenderness.  Abdominal:     General: Bowel sounds are normal. There is no distension.     Palpations: Abdomen is soft. Abdomen is not rigid. There is no fluid wave or mass.     Tenderness: There is no abdominal tenderness. There is no guarding or rebound.  Lymphadenopathy:     Head:     Right side of head: No submental, submandibular, tonsillar, preauricular, posterior auricular or occipital adenopathy.     Left side of head: No submental, submandibular, tonsillar, preauricular, posterior auricular or occipital adenopathy.     Cervical: No cervical adenopathy.     Right cervical: No superficial, deep or posterior  cervical adenopathy.    Left cervical: No superficial, deep or posterior cervical adenopathy.  Skin:    General: Skin is warm and dry.  Neurological:     Mental Status: She is alert.  Psychiatric:        Speech: Speech normal.        Behavior: Behavior normal.        Thought Content: Thought content normal.

## 2023-07-04 NOTE — Assessment & Plan Note (Signed)
Clinical breast exam performed today.  Reiterated the importance of self breast exam at home particularly in the setting of family history.  Deferred pelvic exam as patient is following with oncology at this time.  Patient declines tetanus.

## 2023-07-04 NOTE — Patient Instructions (Signed)
Tdap ( tetanus vaccine) when you can  Health Maintenance, Female Adopting a healthy lifestyle and getting preventive care are important in promoting health and wellness. Ask your health care provider about: The right schedule for you to have regular tests and exams. Things you can do on your own to prevent diseases and keep yourself healthy. What should I know about diet, weight, and exercise? Eat a healthy diet  Eat a diet that includes plenty of vegetables, fruits, low-fat dairy products, and lean protein. Do not eat a lot of foods that are high in solid fats, added sugars, or sodium. Maintain a healthy weight Body mass index (BMI) is used to identify weight problems. It estimates body fat based on height and weight. Your health care provider can help determine your BMI and help you achieve or maintain a healthy weight. Get regular exercise Get regular exercise. This is one of the most important things you can do for your health. Most adults should: Exercise for at least 150 minutes each week. The exercise should increase your heart rate and make you sweat (moderate-intensity exercise). Do strengthening exercises at least twice a week. This is in addition to the moderate-intensity exercise. Spend less time sitting. Even light physical activity can be beneficial. Watch cholesterol and blood lipids Have your blood tested for lipids and cholesterol at 34 years of age, then have this test every 5 years. Have your cholesterol levels checked more often if: Your lipid or cholesterol levels are high. You are older than 34 years of age. You are at high risk for heart disease. What should I know about cancer screening? Depending on your health history and family history, you may need to have cancer screening at various ages. This may include screening for: Breast cancer. Cervical cancer. Colorectal cancer. Skin cancer. Lung cancer. What should I know about heart disease, diabetes, and high  blood pressure? Blood pressure and heart disease High blood pressure causes heart disease and increases the risk of stroke. This is more likely to develop in people who have high blood pressure readings or are overweight. Have your blood pressure checked: Every 3-5 years if you are 40-48 years of age. Every year if you are 62 years old or older. Diabetes Have regular diabetes screenings. This checks your fasting blood sugar level. Have the screening done: Once every three years after age 68 if you are at a normal weight and have a low risk for diabetes. More often and at a younger age if you are overweight or have a high risk for diabetes. What should I know about preventing infection? Hepatitis B If you have a higher risk for hepatitis B, you should be screened for this virus. Talk with your health care provider to find out if you are at risk for hepatitis B infection. Hepatitis C Testing is recommended for: Everyone born from [birth year] through 1967. Anyone with known risk factors for hepatitis C. Sexually transmitted infections (STIs) Get screened for STIs, including gonorrhea and chlamydia, if: You are sexually active and are younger than 34 years of age. You are older than 34 years of age and your health care provider tells you that you are at risk for this type of infection. Your sexual activity has changed since you were last screened, and you are at increased risk for chlamydia or gonorrhea. Ask your health care provider if you are at risk. Ask your health care provider about whether you are at high risk for HIV. Your health care provider may  recommend a prescription medicine to help prevent HIV infection. If you choose to take medicine to prevent HIV, you should first get tested for HIV. You should then be tested every 3 months for as long as you are taking the medicine. Pregnancy If you are about to stop having your period (premenopausal) and you may become pregnant, seek counseling  before you get pregnant. Take 400 to 800 micrograms (mcg) of folic acid every day if you become pregnant. Ask for birth control (contraception) if you want to prevent pregnancy. Osteoporosis and menopause Osteoporosis is a disease in which the bones lose minerals and strength with aging. This can result in bone fractures. If you are 57 years old or older, or if you are at risk for osteoporosis and fractures, ask your health care provider if you should: Be screened for bone loss. Take a calcium or vitamin D supplement to lower your risk of fractures. Be given hormone replacement therapy (HRT) to treat symptoms of menopause. Follow these instructions at home: Alcohol use Do not drink alcohol if: Your health care provider tells you not to drink. You are pregnant, may be pregnant, or are planning to become pregnant. If you drink alcohol: Limit how much you have to: 0-1 drink a day. Know how much alcohol is in your drink. In the [place] one drink equals one 12 oz bottle of beer (355 mL), one 5 oz glass of wine (148 mL), or one 1 oz glass of hard liquor (44 mL). Lifestyle Do not use any products that contain nicotine or tobacco. These products include cigarettes, chewing tobacco, and vaping devices, such as e-cigarettes. If you need help quitting, ask your health care provider. Do not use street drugs. Do not share needles. Ask your health care provider for help if you need support or information about quitting drugs. General instructions Schedule regular health, dental, and eye exams. Stay current with your vaccines. Tell your health care provider if: You often feel depressed. You have ever been abused or do not feel safe at home. Summary Adopting a healthy lifestyle and getting preventive care are important in promoting health and wellness. Follow your health care provider's instructions about healthy diet, exercising, and getting tested or screened for diseases. Follow your health care  provider's instructions on monitoring your cholesterol and blood pressure. This information is not intended to replace advice given to you by your health care provider. Make sure you discuss any questions you have with your health care provider. Document Revised: 04/11/2021 Document Reviewed: 04/11/2021 Elsevier Patient Education  2024 [employer].

## 2023-07-04 NOTE — Assessment & Plan Note (Signed)
Deferred pelvic exam today as patient is following with oncology, Dr. Clifton James.  Will follow

## 2023-07-05 ENCOUNTER — Inpatient Hospital Stay: Payer: Managed Care, Other (non HMO) | Attending: Oncology | Admitting: Oncology

## 2023-07-05 ENCOUNTER — Encounter: Payer: Self-pay | Admitting: Oncology

## 2023-07-05 VITALS — BP 136/96 | HR 73 | Temp 98.4°F | Resp 18 | Ht 67.5 in | Wt 197.3 lb

## 2023-07-05 DIAGNOSIS — Z800 Family history of malignant neoplasm of digestive organs: Secondary | ICD-10-CM | POA: Insufficient documentation

## 2023-07-05 DIAGNOSIS — Z79899 Other long term (current) drug therapy: Secondary | ICD-10-CM | POA: Insufficient documentation

## 2023-07-05 DIAGNOSIS — D500 Iron deficiency anemia secondary to blood loss (chronic): Secondary | ICD-10-CM | POA: Insufficient documentation

## 2023-07-05 NOTE — Progress Notes (Signed)
Hematology/Oncology Progress note Telephone:([phone] Fax:([phone]) [phone]        CHIEF COMPLAINTS/REASON FOR VISIT:  Follow up for iron deficiency anemia.    ASSESSMENT & PLAN:   Iron deficiency anemia due to chronic blood loss #Iron deficiency anemia, Labs reviewed and discussed with patient. Lab Results  Component Value Date   HGB 11.6 (L) 07/02/2023   TIBC 465 (H) 07/02/2023   IRONPCTSAT 18 07/02/2023   FERRITIN 7 (L) 07/02/2023    Improvement of hemoglobin.  Ferritin remains low at 2000. Patient previously tolerated Venofer, however cost is prohibitive. She declines Venofoer.  Recommend oral iron Vitron C 65 mg twice daily- OTC supply.    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   Orders Placed This Encounter  Procedures   CBC with Differential (Cancer Center Only)    Standing Status:   Future    Standing Expiration Date:   07/04/2024   Iron and TIBC    Standing Status:   Future    Standing Expiration Date:   07/04/2024   Ferritin    Standing Status:   Future    Standing Expiration Date:   07/04/2024   Follow up in 6 months.  All questions were answered. The patient knows to call the clinic with any problems, questions or concerns.  Rickard Patience, MD, PhD [facility] Hematology Oncology 07/05/2023   HISTORY OF PRESENTING ILLNESS:   Kim Reeves is a  32 y.o.  female presents for follow up of iron deficiency anemia.   She reports "energy levels are pretty good" but acknowledges ongoing iron deficiency with current hemoglobin at 11.1, which is lower than 6 months prior, iron saturation of 8, and ferritin of 7. - Underwent Vaginal wall biopsy and cervix cone surgery 01/30/23  and experienced prolonged bleeding postoperatively. - First menstrual cycle post-surgery was "heavier" than usual, which was expected according to the gynecologist. - she takes vitron C for iron supplementation, which is better tolerated    INTERVAL HISTORY Kim Reeves is a 32 y.o. female who has above history reviewed by me today presents for follow up visit for ; iron deficiency anemia Patient takes oral iron supplementation.  No new complaints.   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    SURGICAL HISTORY: Past Surgical History:  Procedure Laterality Date   CERVIX SURGERY     NO PAST SURGERIES      SOCIAL HISTORY: Social History   Socioeconomic History   Marital status: Single    Spouse name: Not on file   Number of children: 0   Years of education: Not on file   Highest education level: Not on file  Occupational History   Not on file  Tobacco Use   Smoking status: Never   Smokeless tobacco: Never  Vaping Use   Vaping status: Never Used  Substance and Sexual Activity   Alcohol use: Yes    Comment: socially  Drug use: Not Currently    Types: Other-see comments    Comment: Etables    Sexual activity: Yes    Birth control/protection: Condom  Other Topics Concern   Not on file  Social History Narrative   Originally from [place]      Wants to apply to [employer] for [employer] studies      Works as Air cabin crew      Recent break up 10/2016      Social Determinants of Health   Financial Resource Strain: Low Risk  (02/05/2023)   Received from [employer], [facility]   Overall Financial Resource Strain (CARDIA)    Difficulty of Paying Living Expenses: Not very hard  Food Insecurity: No Food Insecurity (02/05/2023)   Received from [facility], [facility]   Hunger Vital Sign    Worried About Running Out of Food in the Last Year: Never true    Ran Out of Food in the Last Year: Never true  Transportation Needs: No Transportation Needs (02/05/2023)   Received from [facility], [facility]   PRAPARE - Transportation    Lack of Transportation (Medical): No    Lack of Transportation (Non-Medical): No  Physical Activity: Insufficiently Active (02/05/2023)   Received from [facility], [facility]   Exercise Vital Sign    Days of Exercise per Week: 2 days    Minutes of Exercise per Session: 50 min  Stress: No Stress Concern Present (02/05/2023)   Received from [facility], [facility] - Occupational Stress Questionnaire    Feeling of Stress : Not at all  Social Connections: Socially Integrated (02/05/2023)   Received from [facility], [facility]   Social Network    How would you rate your social network (family, work, friends)?: Good participation with social networks  Intimate Partner Violence: Not At Risk (02/05/2023)   Received from [facility], [facility]   HITS    Over the last 12 months how often did your partner physically hurt you?: 1    Over the last 12 months how often did your partner insult you or talk down to you?: 1    Over the last 12 months how often did your partner threaten you with physical harm?: 1    Over the last 12 months how often did your partner scream or curse at you?: 1    FAMILY HISTORY: Family History  Problem Relation Age of Onset   Colon cancer Maternal Grandmother 62   Breast cancer Maternal Grandmother 62   Breast cancer Paternal Aunt 53   Colon cancer Maternal Aunt 52   Pancreatic cancer Maternal Aunt    Breast cancer Cousin 65    ALLERGIES:  has No Known Allergies.  MEDICATIONS:  Current Outpatient Medications  Medication Sig Dispense Refill   Clobetasol Propionate 0.05 % shampoo Apply 1 Application topically as  directed. Every other week 118 mL 6   hydrochlorothiazide (HYDRODIURIL) 25 MG tablet Take 1 tablet (25 mg total) by mouth daily. 90 tablet 3   Iron-Vitamin C 65-125 MG TABS Take 1 tablet by mouth daily. 30 tablet 4   ketoconazole (NIZORAL) 2 % shampoo Apply 1 Application topically every 30 (thirty) days. 120 mL 6   meloxicam (MOBIC) 7.5 MG tablet Take 1 tablet (7.5 mg total) by mouth daily as needed for pain. 90 tablet 1   No current facility-administered medications for this visit.    Review of Systems  Constitutional:  Negative  for appetite change, chills, fatigue and fever.  HENT:   Negative for hearing loss and voice change.   Eyes:  Negative for eye problems.  Respiratory:  Negative for chest tightness and cough.   Cardiovascular:  Negative for chest pain.  Gastrointestinal:  Negative for abdominal distention, abdominal pain and blood in stool.  Endocrine: Negative for hot flashes.  Genitourinary:  Negative for difficulty urinating and frequency.   Musculoskeletal:  Negative for arthralgias.  Skin:  Negative for itching and rash.  Neurological:  Negative for extremity weakness.  Hematological:  Negative for adenopathy.  Psychiatric/Behavioral:  Negative for confusion.    PHYSICAL EXAMINATION:  Vitals:   07/05/23 1448  BP: (!) 136/96  Pulse: 73  Resp: 18  Temp: 98.4 F (36.9 C)  SpO2: 99%   Filed Weights   07/05/23 1448  Weight: 197 lb 4.8 oz (89.5 kg)    Physical Exam Constitutional:      General: She is not in acute distress. HENT:     Head: Normocephalic and atraumatic.  Eyes:     General: No scleral icterus. Cardiovascular:     Rate and Rhythm: Normal rate.  Pulmonary:     Effort: Pulmonary effort is normal. No respiratory distress.  Abdominal:     General: There is no distension.  Musculoskeletal:        General: No deformity. Normal range of motion.     Cervical back: Normal range of motion and neck supple.  Skin:    Findings: No erythema or rash.   Neurological:     Mental Status: She is alert and oriented to person, place, and time. Mental status is at baseline.  Psychiatric:        Mood and Affect: Mood normal.     LABORATORY DATA:  I have reviewed the data as listed    Latest Ref Rng & Units 07/02/2023   10:42 AM 02/28/2023   11:34 AM 08/28/2022    2:02 PM  CBC  WBC 4.0 - 10.5 K/uL 12.2  9.5  9.9   Hemoglobin 12.0 - 15.0 g/dL 16.1  09.6  04.5   Hematocrit 36.0 - 46.0 % 37.4  36.4  38.6   Platelets 150 - 400 K/uL 318  396  336       Latest Ref Rng & Units 07/02/2023   10:42 AM 04/04/2023   11:29 AM 12/28/2021    3:42 PM  CMP  Glucose 70 - 99 mg/dL 88  82  409   BUN 6 - 20 mg/dL 11  10  12    Creatinine 0.44 - 1.00 mg/dL 8.11  9.14  7.82   Sodium 135 - 145 mmol/L 139  141  140   Potassium 3.5 - 5.1 mmol/L 4.0  4.6  3.9   Chloride 98 - 111 mmol/L 108  101  103   CO2 22 - 32 mmol/L 23  23  26    Calcium 8.9 - 10.3 mg/dL 9.1  9.9  9.1   Total Protein 6.5 - 8.1 g/dL 7.5   7.1   Total Bilirubin 0.3 - 1.2 mg/dL 0.6   0.4   Alkaline Phos 38 - 126 U/L 59   66   AST 15 - 41 U/L 20   13   ALT 0 - 44 U/L 16   11       RADIOGRAPHIC STUDIES: I have personally reviewed the radiological images as listed and agreed with the findings in the report. No results found.

## 2023-07-05 NOTE — Assessment & Plan Note (Addendum)
#  Iron deficiency anemia, Labs reviewed and discussed with patient. Lab Results  Component Value Date   HGB 11.6 (L) 07/02/2023   TIBC 465 (H) 07/02/2023   IRONPCTSAT 18 07/02/2023   FERRITIN 7 (L) 07/02/2023    Improvement of hemoglobin.  Ferritin remains low at 2000. Patient previously tolerated Venofer, however cost is prohibitive. She declines Venofoer.  Recommend oral iron Vitron C 65 mg twice daily- OTC supply.    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

## 2023-07-05 NOTE — Progress Notes (Signed)
No concerns for the provider.

## 2023-07-06 ENCOUNTER — Telehealth: Payer: Self-pay

## 2023-07-06 NOTE — Telephone Encounter (Signed)
[name], FNP  [facility] Clinical Need OV notes from past year from Dr Maxie Better, GYN, [facility]  E fax has been sent requesting the recs @ [phone]

## 2023-07-07 ENCOUNTER — Other Ambulatory Visit: Payer: Self-pay | Admitting: Family

## 2023-07-07 DIAGNOSIS — B9689 Other specified bacterial agents as the cause of diseases classified elsewhere: Secondary | ICD-10-CM

## 2023-07-07 MED ORDER — METRONIDAZOLE 500 MG PO TABS: 500.0000 mg | ORAL_TABLET | Freq: Two times a day (BID) | ORAL | 0 refills | Status: DC

## 2023-10-24 ENCOUNTER — Ambulatory Visit: Payer: Managed Care, Other (non HMO) | Admitting: Dermatology

## 2023-11-06 ENCOUNTER — Ambulatory Visit
Admission: EM | Admit: 2023-11-06 | Discharge: 2023-11-06 | Disposition: A | Discharge status: Home / Self Care | Payer: Managed Care, Other (non HMO) | Attending: Emergency Medicine | Admitting: Emergency Medicine

## 2023-11-06 DIAGNOSIS — J069 Acute upper respiratory infection, unspecified: Secondary | ICD-10-CM | POA: Diagnosis present

## 2023-11-06 LAB — SARS CORONAVIRUS 2 (TAT 6-24 HRS): SARS Coronavirus 2: NEGATIVE

## 2023-11-06 LAB — POCT RAPID STREP A (OFFICE): Rapid Strep A Screen: NEGATIVE

## 2023-11-06 NOTE — ED Provider Notes (Signed)
Kim Reeves    CSN: [id number] Arrival date & time: 11/06/23  1119      History   Chief Complaint Chief Complaint  Patient presents with   Generalized Body Aches   Fever    HPI Kim Reeves is a 32 y.o. female.   Patient presents for evaluation of subjective fever, chills, body aches, sore throat, intermittent generalized headaches and nausea without vomiting present for 1 day.  Associated increased fatigue.  No known sick contacts.  Poor appetite but able to tolerate some food and liquids.  Has attempted use of Tylenol, warm tea and cough drops.  Denies cough, congestion.  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    Patient Active Problem List   Diagnosis Date Noted   ASCUS with positive high risk HPV cervical 07/04/2023   Pharyngitis 04/04/2023   Urinary frequency 07/03/2022   Constipation 03/28/2021   Nausea and vomiting 03/03/2021   Genetic testing 02/21/2021   Family history of breast cancer    Family history of colon cancer    Family history of lung cancer    Family history of throat cancer    Iron deficiency anemia due to chronic blood loss 01/25/2021   Bacterial vaginosis 07/06/2020   Neck pain 05/26/2020   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 Authorizing Provider  Clobetasol Propionate 0.05 % shampoo Apply 1 Application topically as directed. Every other week 10/18/22   Deirdre Evener, MD  hydrochlorothiazide (HYDRODIURIL) 25 MG tablet Take 1 tablet (25 mg total) by mouth daily. 03/23/23   [name], FNP  Iron-Vitamin C 65-125 MG TABS Take 1 tablet by mouth daily. 04/25/22   Rickard Patience, MD  ketoconazole (NIZORAL) 2 % shampoo Apply 1 Application topically every 30 (thirty) days. 10/18/22   Deirdre Evener, MD  meloxicam (MOBIC) 7.5 MG tablet Take 1 tablet (7.5 mg total) by mouth daily as needed for pain. 12/30/21   [name], FNP  metroNIDAZOLE (FLAGYL) 500 MG tablet Take 1 tablet (500 mg total) by mouth 2 (two) times daily. Patient not  taking: Reported on 11/06/2023 07/07/23   [name], FNP    Family History Family History  Problem Relation Age of Onset   Colon cancer Maternal Grandmother 72   Breast cancer Maternal Grandmother 88   Breast cancer Paternal Aunt 18   Colon cancer Maternal Aunt 52   Pancreatic cancer Maternal Aunt    Breast cancer Cousin 7    Social History Social History   Tobacco Use   Smoking status: Never   Smokeless tobacco: Never  Vaping Use   Vaping status: Never Used  Substance Use Topics   Alcohol use: Yes    Comment: socially   Drug use: Not Currently    Types: Other-see comments    Comment: Etables      Allergies   Patient has no known allergies.   Review of Systems Review of Systems   Physical Exam Triage Vital Signs ED Triage Vitals  Encounter Vitals Group     BP 11/06/23 1201 (!) 155/89     Systolic BP Percentile --      Diastolic BP Percentile --       Pulse Rate 11/06/23 1201 (!) 101     Resp 11/06/23 1201 18     Temp 11/06/23 1201 (!) 101.1 F (38.4 C)     Temp src --      SpO2 11/06/23 1201 98 %     Weight --      Height --      Head Circumference --      Peak Flow --      Pain Score 11/06/23 1154 8     Pain Loc --      Pain Education --      Exclude from Growth Chart --    No data found.  Updated Vital Signs BP (!) 155/89   Pulse (!) 101   Temp (!) 101.1 F (38.4 C)   Resp 18   LMP 10/29/2023   SpO2 98%   Visual Acuity Right Eye Distance:   Left Eye Distance:   Bilateral Distance:    Right Eye Near:   Left Eye Near:    Bilateral Near:     Physical Exam Constitutional:      Appearance: She is ill-appearing.  HENT:     Head: Normocephalic.     Right Ear: Tympanic membrane, ear canal and external ear normal.     Left Ear: Tympanic membrane, ear canal and external ear normal.     Nose: Nose normal.     Mouth/Throat:     Pharynx: No oropharyngeal exudate or posterior oropharyngeal erythema.  Eyes:     Extraocular Movements: Extraocular movements intact.  Cardiovascular:     Rate and Rhythm: Normal rate and regular rhythm.     Pulses: Normal pulses.     Heart sounds: Normal heart sounds.  Pulmonary:     Effort: Pulmonary effort is normal.     Breath sounds: Normal breath sounds.  Musculoskeletal:     Cervical back: Normal range of motion and neck supple.  Lymphadenopathy:     Cervical: Cervical adenopathy present.  Neurological:     Mental Status: She is alert and oriented to person, place, and time. Mental status is at baseline.      UC Treatments / Results  Labs (all labs ordered are listed, but only abnormal results are displayed) Labs Reviewed  SARS CORONAVIRUS 2 (TAT 6-24 HRS)  POCT RAPID STREP A (OFFICE)    EKG   Radiology No  results found.  Procedures Procedures (including critical care time)  Medications Ordered in UC Medications - No data to display  Initial Impression  / Assessment and Plan / UC Course  I have reviewed the triage vital signs and the nursing notes.  Pertinent labs & imaging results that were available during my care of the patient were reviewed by me and considered in my medical decision making (see chart for details).  Viral URI  Patient is in no signs of distress nor toxic appearing.  Fever of 101.1 with associated tachycardia noted in triage, declined treatment.  Low suspicion for pneumonia, pneumothorax or bronchitis and therefore will defer imaging.COVID test is pending, reviewed quarantine guidelines per CDC recommendations, healthy adult does not qualify for antivirals.  Rapid strep test negative.May use additional over-the-counter medications as needed for supportive care.  May follow-up with urgent care as needed if symptoms persist or worsen.  Note given.   Final Clinical Impressions(s) / UC Diagnoses   Final diagnoses:  Viral URI     Discharge Instructions      Your symptoms today are most likely being caused by a virus and should steadily improve in time it can take up to 7 to 10 days before you truly start to see a turnaround however things will get better  Strep test is negative for bacteria to the throat  COVID test is pending up to 24 hours, you will be notified  of positive test only, if positive you will need to quarantine until without fever for 24 hours, if no fever you may continue activity wearing mask until symptoms go away, please notify those around you have COVID diagnosis as a courtesy    You can take Tylenol and/or Ibuprofen as needed for fever reduction and pain relief.   For cough: honey 1/2 to 1 teaspoon (you can dilute the honey in water or another fluid).  You can also use guaifenesin and dextromethorphan for cough. You can use a humidifier for chest congestion and cough.  If you don't have a humidifier, you can sit in the bathroom with the hot shower running.      For sore throat: try warm salt water  gargles, cepacol lozenges, throat spray, warm tea or water with lemon/honey, popsicles or ice, or OTC cold relief medicine for throat discomfort.   For congestion: take a daily anti-histamine like Zyrtec, Claritin, and a oral decongestant, such as pseudoephedrine.  You can also use Flonase 1-2 sprays in each nostril daily.   It is important to stay hydrated: drink plenty of fluids (water, gatorade/powerade/pedialyte, juices, or teas) to keep your throat moisturized and help further relieve irritation/discomfort.    ED Prescriptions   None    PDMP not reviewed this encounter.   Valinda Hoar, NP 11/06/23 [phone]

## 2023-11-06 NOTE — Discharge Instructions (Signed)
Your symptoms today are most likely being caused by a virus and should steadily improve in time it can take up to 7 to 10 days before you truly start to see a turnaround however things will get better  Strep test is negative for bacteria to the throat  COVID test is pending up to 24 hours, you will be notified  of positive test only, if positive you will need to quarantine until without fever for 24 hours, if no fever you may continue activity wearing mask until symptoms go away, please notify those around you have COVID diagnosis as a courtesy    You can take Tylenol and/or Ibuprofen as needed for fever reduction and pain relief.   For cough: honey 1/2 to 1 teaspoon (you can dilute the honey in water or another fluid).  You can also use guaifenesin and dextromethorphan for cough. You can use a humidifier for chest congestion and cough.  If you don't have a humidifier, you can sit in the bathroom with the hot shower running.      For sore throat: try warm salt water gargles, cepacol lozenges, throat spray, warm tea or water with lemon/honey, popsicles or ice, or OTC cold relief medicine for throat discomfort.   For congestion: take a daily anti-histamine like Zyrtec, Claritin, and a oral decongestant, such as pseudoephedrine.  You can also use Flonase 1-2 sprays in each nostril daily.   It is important to stay hydrated: drink plenty of fluids (water, gatorade/powerade/pedialyte, juices, or teas) to keep your throat moisturized and help further relieve irritation/discomfort.

## 2023-11-06 NOTE — ED Triage Notes (Signed)
Patient to Urgent Care with complaints of generalized body aches/ chills/ fevers/ feeling run down and fatigue/ sore throat.  Symptoms started last night.  Using throat coat tea/ gargling salt water.

## 2023-11-18 ENCOUNTER — Encounter: Payer: Self-pay | Admitting: Family

## 2023-12-25 ENCOUNTER — Ambulatory Visit (INDEPENDENT_AMBULATORY_CARE_PROVIDER_SITE_OTHER): Payer: Managed Care, Other (non HMO) | Admitting: Dermatology

## 2023-12-25 ENCOUNTER — Encounter: Payer: Self-pay | Admitting: Dermatology

## 2023-12-25 DIAGNOSIS — Z79899 Other long term (current) drug therapy: Secondary | ICD-10-CM | POA: Diagnosis not present

## 2023-12-25 DIAGNOSIS — Z7189 Other specified counseling: Secondary | ICD-10-CM

## 2023-12-25 DIAGNOSIS — L408 Other psoriasis: Secondary | ICD-10-CM | POA: Diagnosis not present

## 2023-12-25 MED ORDER — CLOBETASOL PROPIONATE 0.05 % EX SHAM
1.0000 | MEDICATED_SHAMPOO | CUTANEOUS | 11 refills | Status: AC
Start: 2023-12-25 — End: ?

## 2023-12-25 MED ORDER — KETOCONAZOLE 2 % EX SHAM: 1.0000 | MEDICATED_SHAMPOO | CUTANEOUS | 11 refills | Status: AC

## 2023-12-25 NOTE — Patient Instructions (Signed)

## 2023-12-25 NOTE — Progress Notes (Signed)
   Follow-Up Visit   Subjective  Kim Reeves is a 33 y.o. female who presents for the following: Sebopsoriasis, scalp, Ketoconazole 2% shampoo qowk, Clobetasol shampoo qowk The patient has spots, moles and lesions to be evaluated, some may be new or changing and the patient may have concern these could be cancer.   The following portions of the chart were reviewed this encounter and updated as appropriate: medications, allergies, medical history  Review of Systems:  No other skin or systemic complaints except as noted in HPI or Assessment and Plan.  Objective  Well appearing patient in no apparent distress; mood and affect are within normal limits.   A focused examination was performed of the following areas: scalp  Relevant exam findings are noted in the Assessment and Plan.    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 Condition is improving with treatment but not currently at goal.   Treatment Plan: Cont Ketoconazole 2% shampoo qowk, pt will use on same day after applying clobetasol shampoo Cont Clobetasol shampoo qowk, apply to dry scalp let sit for 30 minutes and then wash out with Ketoconazole 2% shampoo  Topical steroids (such as triamcinolone, fluocinolone, fluocinonide, mometasone, clobetasol, halobetasol, betamethasone, hydrocortisone) can cause thinning and lightening of the skin if they are used for too long in the same area. Your physician has selected the right strength medicine for your problem and area affected on the body. Please use your medication only as directed by your physician to prevent side effects.   Long term medication management.  Patient is using long term (months to years) prescription medication  to control their dermatologic condition.  These medications require periodic monitoring to evaluate for efficacy and side effects and may require  periodic laboratory monitoring.  SEBOPSORIASIS   Related Medications Clobetasol Propionate 0.05 % shampoo Apply 1 Application topically as directed. Wash scalp every other week, let sit 30 minutes on scalp and wash out with Ketoconazole shampoo ketoconazole (NIZORAL) 2 % shampoo Apply 1 Application topically as directed. Wash scalp every other week, after applying Clobetasol shampoo wash out with Ketoconazole 2% shampoo  Return in about 1 year (around 12/24/2024) for sebopsoriasis.  I, Ardis Rowan, RMA, am acting as scribe for Armida Sans, MD .   Documentation: I have reviewed the above documentation for accuracy and completeness, and I agree with the above.  Armida Sans, MD

## 2023-12-26 ENCOUNTER — Ambulatory Visit: Payer: Managed Care, Other (non HMO) | Admitting: Dermatology

## 2024-01-07 ENCOUNTER — Inpatient Hospital Stay: Payer: Managed Care, Other (non HMO) | Attending: Oncology

## 2024-01-07 DIAGNOSIS — Z79899 Other long term (current) drug therapy: Secondary | ICD-10-CM | POA: Diagnosis not present

## 2024-01-07 DIAGNOSIS — D500 Iron deficiency anemia secondary to blood loss (chronic): Secondary | ICD-10-CM | POA: Diagnosis present

## 2024-01-07 DIAGNOSIS — Z803 Family history of malignant neoplasm of breast: Secondary | ICD-10-CM | POA: Insufficient documentation

## 2024-01-07 DIAGNOSIS — Z800 Family history of malignant neoplasm of digestive organs: Secondary | ICD-10-CM | POA: Diagnosis not present

## 2024-01-07 DIAGNOSIS — N920 Excessive and frequent menstruation with regular cycle: Secondary | ICD-10-CM | POA: Diagnosis present

## 2024-01-07 LAB — CBC WITH DIFFERENTIAL (CANCER CENTER ONLY)
Abs Immature Granulocytes: 0.03 10*3/uL (ref 0.00–0.07)
Basophils Absolute: 0 10*3/uL (ref 0.0–0.1)
Basophils Relative: 0 %
Eosinophils Absolute: 0 10*3/uL (ref 0.0–0.5)
Eosinophils Relative: 0 %
HCT: 35.3 % — ABNORMAL LOW (ref 36.0–46.0)
Hemoglobin: 10.8 g/dL — ABNORMAL LOW (ref 12.0–15.0)
Immature Granulocytes: 0 %
Lymphocytes Relative: 30 %
Lymphs Abs: 2.9 10*3/uL (ref 0.7–4.0)
MCH: 21.5 pg — ABNORMAL LOW (ref 26.0–34.0)
MCHC: 30.6 g/dL (ref 30.0–36.0)
MCV: 70.2 fL — ABNORMAL LOW (ref 80.0–100.0)
Monocytes Absolute: 0.7 10*3/uL (ref 0.1–1.0)
Monocytes Relative: 7 %
Neutro Abs: 6.3 10*3/uL (ref 1.7–7.7)
Neutrophils Relative %: 63 %
Platelet Count: 348 10*3/uL (ref 150–400)
RBC: 5.03 MIL/uL (ref 3.87–5.11)
RDW: 15.9 % — ABNORMAL HIGH (ref 11.5–15.5)
WBC Count: 9.9 10*3/uL (ref 4.0–10.5)
nRBC: 0 % (ref 0.0–0.2)

## 2024-01-07 LAB — IRON AND TIBC
Iron: 75 ug/dL (ref 28–170)
Saturation Ratios: 15 % (ref 10.4–31.8)
TIBC: 491 ug/dL — ABNORMAL HIGH (ref 250–450)
UIBC: 416 ug/dL

## 2024-01-07 LAB — FERRITIN: Ferritin: 4 ng/mL — ABNORMAL LOW (ref 11–307)

## 2024-01-09 NOTE — Assessment & Plan Note (Addendum)
#  Iron  deficiency anemia, Labs reviewed and discussed with patient. Lab Results  Component Value Date   HGB 10.8 (L) 01/07/2024   TIBC 491 (H) 01/07/2024   IRONPCTSAT 15 01/07/2024   FERRITIN 4 (L) 01/07/2024    Improvement of hemoglobin.  Ferritin remains low at 7 Patient previously tolerated Venofer  well, however cost is prohibitive. She is ok with getting 2 doses of Venofer  treatments. Nausea after taking Vitron C 65mg  daily. Recommend to switch to Slow Fe 45mg  daily with vitamin c  supplementation.   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

## 2024-01-10 ENCOUNTER — Inpatient Hospital Stay: Payer: Managed Care, Other (non HMO) | Admitting: Oncology

## 2024-01-10 ENCOUNTER — Encounter: Payer: Self-pay | Admitting: Oncology

## 2024-01-10 VITALS — BP 143/91 | HR 67 | Temp 98.0°F | Resp 18 | Wt 200.6 lb

## 2024-01-10 DIAGNOSIS — D500 Iron deficiency anemia secondary to blood loss (chronic): Secondary | ICD-10-CM | POA: Diagnosis not present

## 2024-01-10 DIAGNOSIS — N920 Excessive and frequent menstruation with regular cycle: Secondary | ICD-10-CM

## 2024-01-10 MED ORDER — FERROUS SULFATE ER 142 (45 FE) MG PO TBCR: 1.0000 | EXTENDED_RELEASE_TABLET | Freq: Every day | ORAL | 5 refills | Status: AC

## 2024-01-10 NOTE — Progress Notes (Signed)
 Hematology/Oncology Progress note [phone] Fax:([phone]        CHIEF COMPLAINTS/REASON FOR VISIT:  Follow up for iron  deficiency anemia.    ASSESSMENT & PLAN:   Iron  deficiency anemia due to chronic blood loss #Iron  deficiency anemia, Labs reviewed and discussed with patient. Lab Results  Component Value Date   HGB 10.8 (L) 01/07/2024   TIBC 491 (H) 01/07/2024   IRONPCTSAT 15 01/07/2024   FERRITIN 4 (L) 01/07/2024    Improvement of hemoglobin.  Ferritin remains low at 7 Patient previously tolerated Venofer  well, however cost is prohibitive. She is ok with getting 2 doses of Venofer  treatments. Nausea after taking Vitron C 65mg  daily. Recommend to switch to Slow Fe 45mg  daily with vitamin c  supplementation.   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  Menorrhagia Recommend patient to follow up with Gyn.    Orders Placed This Encounter  Procedures   CBC with Differential (Cancer Center Only)    Standing Status:   Future    Expected Date:   07/09/2024    Expiration Date:   01/09/2025   Iron  and TIBC    Standing Status:   Future    Expected Date:   07/09/2024    Expiration Date:   01/09/2025   Ferritin    Standing Status:   Future    Expected Date:   07/09/2024    Expiration Date:   01/09/2025   Retic Panel    Standing Status:   Future    Expected Date:   07/09/2024    Expiration Date:   01/09/2025   Pregnancy, urine    Standing Status:   Future    Expected Date:   02/07/2024    Expiration Date:   01/09/2025   Pregnancy, urine    Standing Status:   Future    Expected Date:   01/24/2024    Expiration Date:   01/09/2025   Follow up in 6 months.  All questions were answered. The patient knows to call the clinic with any problems, questions or concerns.  Zelphia Cap, MD, PhD [facility] Hematology Oncology 01/10/2024   HISTORY OF PRESENTING ILLNESS:   Kim Reeves is a  33 y.o.  female presents for follow  up of iron  deficiency anemia.   She reports energy levels are pretty good but acknowledges ongoing iron  deficiency with current hemoglobin at 11.1, which is lower than 6 months prior, iron  saturation of 8, and ferritin of 7. - Underwent Vaginal wall biopsy and cervix cone surgery 01/30/23  and experienced prolonged bleeding postoperatively. - First menstrual cycle post-surgery was heavier than usual, which was expected according to the gynecologist.   INTERVAL HISTORY Kim Reeves is a 33 y.o. female who has above history reviewed by me today presents for follow up visit for ; iron  deficiency anemia Patient takes oral iron  supplementation.  No new complaints. Menstrual period is lighter, still quite heavy.    MEDICAL HISTORY:  Past Medical History:  Diagnosis Date   Anemia    Chlamydia    Family history of breast cancer    12/20 cancer genetic tesing letter sent   Family history of breast cancer    Family history of colon cancer    Family history of lung cancer    Family history of pancreatic cancer    Family history of throat cancer    GAD (generalized anxiety disorder)    Gonorrhea    Hypertension    Major depressive  disorder, single episode, severe without psychotic features (HCC) 01/25/2016    SURGICAL HISTORY: Past Surgical History:  Procedure Laterality Date   CERVIX SURGERY     NO PAST SURGERIES      SOCIAL HISTORY: Social History   Socioeconomic History   Marital status: Single    Spouse name: Not on file   Number of children: 0   Years of education: Not on file   Highest education level: Not on file  Occupational History   Not on file  Tobacco Use   Smoking status: Never   Smokeless tobacco: Never  Vaping Use   Vaping status: Never Used  Substance and Sexual Activity   Alcohol use: Yes    Comment: socially   Drug use: Not Currently    Types: Other-see comments    Comment: Etables    Sexual activity: Yes    Birth control/protection: Condom  Other  Topics Concern   Not on file  Social History Narrative   Originally from [place]      Wants to apply to [employer] for [employer] studies      Works as air cabin crew      Recent break up 10/2016      Social Drivers of [employer]: Low Risk  (02/05/2023)   Received from [employer], [facility]   Overall Financial Resource Strain (CARDIA)    Difficulty of Paying Living Expenses: Not very hard  Food Insecurity: No Food Insecurity (02/05/2023)   Received from [facility], [facility]   Hunger Vital Sign    Worried About Running Out of Food in the Last Year: Never true    Ran Out of Food in the Last Year: Never true  Transportation Needs: No Transportation Needs (02/05/2023)   Received from [facility], [facility]   PRAPARE - Transportation    Lack of Transportation (Medical): No    Lack of Transportation (Non-Medical): No  Physical Activity: Insufficiently Active (02/05/2023)   Received from [facility], [facility]   Exercise Vital Sign    Days of Exercise per Week: 2 days    Minutes of Exercise per Session: 50 min  Stress: No Stress Concern Present (02/05/2023)   Received from [facility], [facility] - Occupational Stress Questionnaire    Feeling of Stress : Not at all  Social Connections: Socially Integrated (02/05/2023)   Received from [facility], [facility]   Social Network    How would you rate your social network (family, work, friends)?: Good participation with social networks  Intimate Partner Violence: Not At Risk (02/05/2023)   Received from [facility], [facility]   HITS    Over the last 12 months how often did your partner physically hurt you?: Never    Over the last 12 months how often did your partner insult you or talk down to you?: Never    Over the last 12 months how often did your partner threaten you with physical harm?: Never    Over the last 12 months how often did your  partner scream or curse at you?: Never    FAMILY HISTORY: Family History  Problem Relation Age of Onset   Colon cancer Maternal Grandmother 60   Breast cancer Maternal Grandmother 35   Breast cancer Paternal Aunt 85   Colon cancer Maternal Aunt 52   Pancreatic cancer Maternal Aunt    Breast cancer Cousin 58    ALLERGIES:  has no  known allergies.  MEDICATIONS:  Current Outpatient Medications  Medication Sig Dispense Refill   Clobetasol  Propionate 0.05 % shampoo Apply 1 Application topically as directed. Wash scalp every other week, let sit 30 minutes on scalp and wash out with Ketoconazole  shampoo 118 mL 11   ferrous sulfate  ER (SLOW FE) 142 (45 Fe) MG TBCR tablet Take 1 tablet (45 mg of iron  total) by mouth daily. 30 tablet 5   hydrochlorothiazide  (HYDRODIURIL ) 25 MG tablet Take 1 tablet (25 mg total) by mouth daily. 90 tablet 3   ketoconazole  (NIZORAL ) 2 % shampoo Apply 1 Application topically as directed. Wash scalp every other week, after applying Clobetasol  shampoo wash out with Ketoconazole  2% shampoo 120 mL 11   meloxicam  (MOBIC ) 7.5 MG tablet Take 1 tablet (7.5 mg total) by mouth daily as needed for pain. 90 tablet 1   metroNIDAZOLE  (FLAGYL ) 500 MG tablet Take 1 tablet (500 mg total) by mouth 2 (two) times daily. (Patient not taking: Reported on 01/10/2024) 14 tablet 0   No current facility-administered medications for this visit.    Review of Systems  Constitutional:  Negative for appetite change, chills, fatigue and fever.  HENT:   Negative for hearing loss and voice change.   Eyes:  Negative for eye problems.  Respiratory:  Negative for chest tightness and cough.   Cardiovascular:  Negative for chest pain.  Gastrointestinal:  Negative for abdominal distention, abdominal pain and blood in stool.  Endocrine: Negative for hot flashes.  Genitourinary:  Negative for difficulty urinating and frequency.   Musculoskeletal:  Negative for arthralgias.  Skin:  Negative for itching  and rash.  Neurological:  Negative for extremity weakness.  Hematological:  Negative for adenopathy.  Psychiatric/Behavioral:  Negative for confusion.    PHYSICAL EXAMINATION:  Vitals:   01/10/24 1018  BP: (!) 143/91  Pulse: 67  Resp: 18  Temp: 98 F (36.7 C)  SpO2: 100%   Filed Weights   01/10/24 1018  Weight: 200 lb 9.6 oz (91 kg)    Physical Exam Constitutional:      General: She is not in acute distress. HENT:     Head: Normocephalic and atraumatic.  Eyes:     General: No scleral icterus. Cardiovascular:     Rate and Rhythm: Normal rate.  Pulmonary:     Effort: Pulmonary effort is normal. No respiratory distress.  Abdominal:     General: There is no distension.  Musculoskeletal:        General: No deformity. Normal range of motion.     Cervical back: Normal range of motion and neck supple.  Skin:    Findings: No erythema or rash.  Neurological:     Mental Status: She is alert and oriented to person, place, and time. Mental status is at baseline.  Psychiatric:        Mood and Affect: Mood normal.     LABORATORY DATA:  I have reviewed the data as listed    Latest Ref Rng & Units 01/07/2024    9:20 AM 07/02/2023   10:42 AM 02/28/2023   11:34 AM  CBC  WBC 4.0 - 10.5 K/uL 9.9  12.2  9.5   Hemoglobin 12.0 - 15.0 g/dL 89.1  88.3  88.8   Hematocrit 36.0 - 46.0 % 35.3  37.4  36.4   Platelets 150 - 400 K/uL 348  318  396       Latest Ref Rng & Units 07/02/2023   10:42 AM 04/04/2023   11:29  AM 12/28/2021    3:42 PM  CMP  Glucose 70 - 99 mg/dL 88  82  896   BUN 6 - 20 mg/dL 11  10  12    Creatinine 0.44 - 1.00 mg/dL 9.15  9.08  8.96   Sodium 135 - 145 mmol/L 139  141  140   Potassium 3.5 - 5.1 mmol/L 4.0  4.6  3.9   Chloride 98 - 111 mmol/L 108  101  103   CO2 22 - 32 mmol/L 23  23  26    Calcium 8.9 - 10.3 mg/dL 9.1  9.9  9.1   Total Protein 6.5 - 8.1 g/dL 7.5   7.1   Total Bilirubin 0.3 - 1.2 mg/dL 0.6   0.4   Alkaline Phos 38 - 126 U/L 59   66   AST 15 -  41 U/L 20   13   ALT 0 - 44 U/L 16   11       RADIOGRAPHIC STUDIES: I have personally reviewed the radiological images as listed and agreed with the findings in the report. No results found.

## 2024-01-10 NOTE — Assessment & Plan Note (Signed)
Recommend patient to follow up with Gyn 

## 2024-01-15 ENCOUNTER — Encounter: Payer: Self-pay | Admitting: Dermatology

## 2024-01-17 ENCOUNTER — Other Ambulatory Visit: Payer: Self-pay | Admitting: Oncology

## 2024-01-17 ENCOUNTER — Inpatient Hospital Stay: Payer: Managed Care, Other (non HMO)

## 2024-01-17 VITALS — BP 144/100 | HR 71 | Temp 98.1°F | Resp 19

## 2024-01-17 DIAGNOSIS — D500 Iron deficiency anemia secondary to blood loss (chronic): Secondary | ICD-10-CM

## 2024-01-17 MED ORDER — SODIUM CHLORIDE 0.9% FLUSH
10.0000 mL | Freq: Once | INTRAVENOUS | Status: DC | PRN
  Filled 2024-01-17: qty 10

## 2024-01-17 MED ORDER — SODIUM CHLORIDE 0.9% FLUSH
10.0000 mL | Freq: Once | INTRAVENOUS | Status: AC | PRN
Start: 2024-01-17 — End: 2024-01-17
  Administered 2024-01-17: 10 mL
  Filled 2024-01-17: qty 10

## 2024-01-17 MED ORDER — IRON SUCROSE 20 MG/ML IV SOLN
200.0000 mg | Freq: Once | INTRAVENOUS | Status: AC
  Administered 2024-01-17: 200 mg via INTRAVENOUS

## 2024-01-17 MED ORDER — IRON SUCROSE 20 MG/ML IV SOLN
200.0000 mg | Freq: Once | INTRAVENOUS | Status: DC
Start: 2024-01-17 — End: 2024-01-17
  Filled 2024-01-17: qty 10

## 2024-01-17 NOTE — Progress Notes (Signed)
30 minutes post iv venofer, pt's bp 144/100 and stated she had a mild headache 2/10. Pt denies any other symptoms. Cathie Hoops, MD notified and verified ok to discharge patient at this time. Cathie Hoops, MD also ordered pt to take 650 mg of tylenol if headache persisted. Pt educated to take tylenol 650 mg and was stable at discharge.

## 2024-01-17 NOTE — Progress Notes (Signed)
Pt refuses Urine pregnancy, pt started menstrual cycle 01/14/23. OKay to proceed with Venofer per Dr. Cathie Hoops.

## 2024-01-30 ENCOUNTER — Other Ambulatory Visit: Payer: Self-pay

## 2024-01-30 DIAGNOSIS — D500 Iron deficiency anemia secondary to blood loss (chronic): Secondary | ICD-10-CM

## 2024-01-31 ENCOUNTER — Inpatient Hospital Stay: Payer: Managed Care, Other (non HMO)

## 2024-01-31 VITALS — BP 147/82 | HR 75 | Temp 97.8°F | Resp 18

## 2024-01-31 DIAGNOSIS — D500 Iron deficiency anemia secondary to blood loss (chronic): Secondary | ICD-10-CM | POA: Diagnosis not present

## 2024-01-31 LAB — PREGNANCY, URINE: Preg Test, Ur: NEGATIVE

## 2024-01-31 MED ORDER — IRON SUCROSE 20 MG/ML IV SOLN
200.0000 mg | Freq: Once | INTRAVENOUS | Status: AC
  Administered 2024-01-31: 200 mg via INTRAVENOUS

## 2024-01-31 NOTE — Patient Instructions (Signed)

## 2024-04-10 ENCOUNTER — Ambulatory Visit: Admitting: Podiatry

## 2024-04-15 ENCOUNTER — Ambulatory Visit (INDEPENDENT_AMBULATORY_CARE_PROVIDER_SITE_OTHER): Admitting: Podiatry

## 2024-04-15 DIAGNOSIS — B351 Tinea unguium: Secondary | ICD-10-CM

## 2024-04-15 DIAGNOSIS — Z79899 Other long term (current) drug therapy: Secondary | ICD-10-CM

## 2024-04-15 NOTE — Progress Notes (Unsigned)
 Subjective:  Patient ID: Kim Reeves, female    DOB: [birth year],  MRN: [id number]  Chief Complaint  Patient presents with   Nail Problem    Right hallux nail possible nail fungus     33 y.o. female presents with the above complaint.  Patient presents with right hallux thickened onychodystrophy mycotic nail x 1.  She would like to discuss treatment options for she is tried topical medication which has not helped.  She would like to discuss oral medication.  Denies seeing anyone else prior to seeing me 0 out of 10 pain scale   Review of Systems: Negative except as noted in the HPI. Denies N/V/F/Ch.  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    Current Outpatient Medications:    Clobetasol  Propionate 0.05 % shampoo, Apply 1 Application topically as directed. Wash scalp every other week, let sit 30 minutes on scalp and wash out with Ketoconazole  shampoo, Disp: 118 mL, Rfl: 11   ferrous sulfate  ER (SLOW FE) 142 (45 Fe) MG TBCR tablet, Take 1 tablet (45 mg of iron  total) by mouth daily., Disp: 30 tablet, Rfl: 5   hydrochlorothiazide  (HYDRODIURIL ) 25 MG tablet, Take 1 tablet (25 mg total) by mouth daily., Disp: 90 tablet, Rfl: 3   ketoconazole  (NIZORAL ) 2 % shampoo, Apply 1 Application topically as directed. Wash scalp every other week, after applying Clobetasol  shampoo wash out with Ketoconazole  2% shampoo, Disp: 120 mL, Rfl: 11   meloxicam  (MOBIC ) 7.5 MG tablet, Take 1 tablet (7.5 mg total) by mouth daily as needed for pain., Disp: 90 tablet, Rfl: 1    metroNIDAZOLE  (FLAGYL ) 500 MG tablet, Take 1 tablet (500 mg total) by mouth 2 (two) times daily. (Patient not taking: Reported on 01/10/2024), Disp: 14 tablet, Rfl: 0  Social History   Tobacco Use  Smoking Status Never  Smokeless Tobacco Never    No Known Allergies Objective:  There were no vitals filed for this visit. There is no height or weight on file to calculate BMI. Constitutional Well developed. Well nourished.  Vascular Dorsalis pedis pulses palpable bilaterally. Posterior tibial pulses palpable bilaterally. Capillary refill normal to all digits.  No cyanosis or clubbing noted. Pedal hair growth normal.  Neurologic Normal speech. Oriented to person, place, and time. Epicritic sensation to light touch grossly present bilaterally.  Dermatologic Nails right hallux thickened and onychodystrophy mycotic nail x 1 Skin within normal limits  Orthopedic: Normal joint ROM without pain or crepitus bilaterally. No visible deformities. No bony tenderness.   Radiographs: None Assessment:   1. Long-term use of high-risk medication   2. Nail fungus   3. Onychomycosis due to dermatophyte    Plan:  Patient was evaluated and treated and all questions answered.  Right hallux onychomycosis -Educated the patient on the etiology of onychomycosis and various treatment options associated with improving the fungal load.  I explained to the patient that there is 3 treatment options available to treat the onychomycosis including topical, p.o.,  laser treatment.  Patient elected to undergo p.o. options with Lamisil/terbinafine therapy.  In order for me to start the medication therapy, I explained to the patient the importance of evaluating the liver and obtaining the liver function test.  Once the liver function test comes back normal I will start him on 79-month course of Lamisil therapy.  Patient understood all risk and would like to proceed with Lamisil therapy.  I have asked the patient to  immediately stop the Lamisil therapy if she has any reactions to it and call the office or go to the emergency room right away.  Patient states understanding   No follow-ups on file.

## 2024-04-17 NOTE — Addendum Note (Signed)
 Addended by: Truth Wolaver on: 04/17/2024 01:26 PM   Modules accepted: Level of Service

## 2024-07-07 ENCOUNTER — Encounter: Payer: Managed Care, Other (non HMO) | Admitting: Family

## 2024-07-07 ENCOUNTER — Inpatient Hospital Stay: Payer: Managed Care, Other (non HMO) | Attending: Oncology

## 2024-07-07 DIAGNOSIS — Z79899 Other long term (current) drug therapy: Secondary | ICD-10-CM | POA: Diagnosis not present

## 2024-07-07 DIAGNOSIS — N920 Excessive and frequent menstruation with regular cycle: Secondary | ICD-10-CM | POA: Diagnosis present

## 2024-07-07 DIAGNOSIS — Z803 Family history of malignant neoplasm of breast: Secondary | ICD-10-CM | POA: Insufficient documentation

## 2024-07-07 DIAGNOSIS — D500 Iron deficiency anemia secondary to blood loss (chronic): Secondary | ICD-10-CM | POA: Insufficient documentation

## 2024-07-07 DIAGNOSIS — Z800 Family history of malignant neoplasm of digestive organs: Secondary | ICD-10-CM | POA: Diagnosis not present

## 2024-07-07 LAB — CBC WITH DIFFERENTIAL (CANCER CENTER ONLY)
Abs Immature Granulocytes: 0.02 K/uL (ref 0.00–0.07)
Basophils Absolute: 0 K/uL (ref 0.0–0.1)
Basophils Relative: 0 %
Eosinophils Absolute: 0.1 K/uL (ref 0.0–0.5)
Eosinophils Relative: 1 %
HCT: 36.1 % (ref 36.0–46.0)
Hemoglobin: 11.4 g/dL — ABNORMAL LOW (ref 12.0–15.0)
Immature Granulocytes: 0 %
Lymphocytes Relative: 34 %
Lymphs Abs: 2.9 K/uL (ref 0.7–4.0)
MCH: 22.8 pg — ABNORMAL LOW (ref 26.0–34.0)
MCHC: 31.6 g/dL (ref 30.0–36.0)
MCV: 72.1 fL — ABNORMAL LOW (ref 80.0–100.0)
Monocytes Absolute: 0.7 K/uL (ref 0.1–1.0)
Monocytes Relative: 8 %
Neutro Abs: 5 K/uL (ref 1.7–7.7)
Neutrophils Relative %: 57 %
Platelet Count: 337 K/uL (ref 150–400)
RBC: 5.01 MIL/uL (ref 3.87–5.11)
RDW: 14.6 % (ref 11.5–15.5)
WBC Count: 8.8 K/uL (ref 4.0–10.5)
nRBC: 0 % (ref 0.0–0.2)

## 2024-07-07 LAB — IRON AND TIBC
Iron: 63 ug/dL (ref 28–170)
Saturation Ratios: 19 % (ref 10.4–31.8)
TIBC: 330 ug/dL (ref 250–450)
UIBC: 267 ug/dL

## 2024-07-07 LAB — RETIC PANEL
Immature Retic Fract: 12.6 % (ref 2.3–15.9)
RBC.: 5.01 MIL/uL (ref 3.87–5.11)
Retic Count, Absolute: 95.2 K/uL (ref 19.0–186.0)
Retic Ct Pct: 1.9 % (ref 0.4–3.1)
Reticulocyte Hemoglobin: 23.4 pg — ABNORMAL LOW (ref >27.9)

## 2024-07-07 LAB — FERRITIN: Ferritin: 34 ng/mL (ref 11–307)

## 2024-07-10 ENCOUNTER — Encounter: Payer: Self-pay | Admitting: Oncology

## 2024-07-10 ENCOUNTER — Inpatient Hospital Stay: Payer: Managed Care, Other (non HMO)

## 2024-07-10 ENCOUNTER — Inpatient Hospital Stay (HOSPITAL_BASED_OUTPATIENT_CLINIC_OR_DEPARTMENT_OTHER): Payer: Managed Care, Other (non HMO) | Admitting: Oncology

## 2024-07-10 VITALS — BP 126/98 | HR 70 | Temp 97.0°F | Resp 18 | Wt 200.6 lb

## 2024-07-10 DIAGNOSIS — N920 Excessive and frequent menstruation with regular cycle: Secondary | ICD-10-CM | POA: Diagnosis not present

## 2024-07-10 DIAGNOSIS — D500 Iron deficiency anemia secondary to blood loss (chronic): Secondary | ICD-10-CM | POA: Diagnosis not present

## 2024-07-10 NOTE — Assessment & Plan Note (Addendum)
#  Iron  deficiency anemia, Labs reviewed and discussed with patient. Lab Results  Component Value Date   HGB 11.4 (L) 07/07/2024   TIBC 330 07/07/2024   IRONPCTSAT 19 07/07/2024   FERRITIN 34 07/07/2024    Stable hemoglobin and ferritin.  No need of Venofer  Slow Fe 45mg  daily with vitamin c  supplementation.   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

## 2024-07-10 NOTE — Progress Notes (Signed)
 Hematology/Oncology Progress note [phone] [phone]        CHIEF COMPLAINTS/REASON FOR VISIT:  Follow up for iron  deficiency anemia.    ASSESSMENT & PLAN:   Iron  deficiency anemia due to chronic blood loss #Iron  deficiency anemia, Labs reviewed and discussed with patient. Lab Results  Component Value Date   HGB 11.4 (L) 07/07/2024   TIBC 330 07/07/2024   IRONPCTSAT 19 07/07/2024   FERRITIN 34 07/07/2024    Stable hemoglobin and ferritin.  No need of Venofer  Slow Fe 45mg  daily with vitamin c  supplementation.   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  Menorrhagia Recommend patient to follow up with Gyn. Improved.    Orders Placed This Encounter  Procedures   CBC with Differential (Cancer Center Only)    Standing Status:   Future    Expected Date:   01/10/2025    Expiration Date:   04/10/2025   Ferritin    Standing Status:   Future    Expected Date:   01/10/2025    Expiration Date:   04/10/2025   Iron  and TIBC    Standing Status:   Future    Expected Date:   01/10/2025    Expiration Date:   04/10/2025   Follow up in 6 months.  All questions were answered. The patient knows to call the clinic with any problems, questions or concerns.  Zelphia Cap, MD, PhD [facility] Hematology Oncology 07/10/2024   HISTORY OF PRESENTING ILLNESS:   Kim Reeves is a  33 y.o.  female presents for follow up of iron  deficiency anemia.   She reports energy levels are pretty good but acknowledges ongoing iron  deficiency with current hemoglobin at 11.1, which is lower than 6 months prior, iron  saturation of 8, and ferritin of 7. - Underwent Vaginal wall biopsy and cervix cone surgery 01/30/23  and experienced prolonged bleeding postoperatively. - First menstrual cycle post-surgery was heavier than usual, which was expected according to the gynecologist.   INTERVAL HISTORY Mishti Kaelin is a 33 y.o. female who  has above history reviewed by me today presents for follow up visit for ; iron  deficiency anemia Patient takes oral iron  supplementation slow Fe. She tolerates Slow Fe.  No new complaints. Menstrual period flow is not as heavy.   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    SURGICAL HISTORY: Past Surgical History:  Procedure Laterality Date   CERVIX SURGERY     NO PAST SURGERIES      SOCIAL HISTORY: Social History   Socioeconomic History   Marital status: Single    Spouse name: Not on file   Number of children: 0   Years of education: Not on file   Highest education level: Not on file  Occupational History   Not on file  Tobacco Use   Smoking status: Never   Smokeless tobacco: Never  Vaping Use   Vaping status: Never Used  Substance and Sexual Activity   Alcohol use: Yes    Comment: socially   Drug use: Not Currently    Types: Other-see comments    Comment: Etables    Sexual activity: Yes    Birth control/protection: Condom  Other Topics Concern   Not on file  Social History Narrative   Originally from [place]      Wants to apply to [employer] for [employer] studies      Works as Air cabin crew      Recent break up 10/2016      Social Drivers of [employer]: Low Risk  (04/30/2024)   Received from [employer]   Overall Financial Resource Strain (CARDIA)    Difficulty of Paying Living Expenses: Not very hard  Food Insecurity: No Food Insecurity (04/30/2024)   Received from [facility]    Hunger Vital Sign    Within the past 12 months, you worried that your food would run out before you got the money to buy more.: Never true    Within the past 12 months, the food you bought just didn't last and you didn't have money to get more.: Never true  Transportation Needs: No Transportation Needs (04/30/2024)   Received from [facility] - Transportation    Lack of Transportation (Medical): No    Lack of Transportation (Non-Medical): No  Physical Activity: Insufficiently Active (04/30/2024)   Received from [facility]   Exercise Vital Sign    On average, how many days per week do you engage in moderate to strenuous exercise (like a brisk walk)?: 3 days    On average, how many minutes do you engage in exercise at this level?: 30 min  Stress: No Stress Concern Present (04/30/2024)   Received from [facility] - Occupational Stress Questionnaire    Feeling of Stress : Only a little  Social Connections: Socially Integrated (04/30/2024)   Received from [facility]   Social Network    How would you rate your social network (family, work, friends)?: Good participation with social networks  Intimate Partner Violence: Not At Risk (04/30/2024)   Received from [facility]   HITS    Over the last 12 months how often did your partner physically hurt you?: Never    Over the last 12 months how often did your partner insult you or talk down to you?: Never    Over the last 12 months how often did your partner threaten you with physical harm?: Never    Over the last 12 months how often did your partner scream or curse at you?: Never    FAMILY HISTORY: Family History  Problem Relation Age of Onset   Colon cancer Maternal Grandmother 60   Breast cancer Maternal Grandmother 92   Breast cancer Paternal Aunt 29   Colon cancer Maternal Aunt 52   Pancreatic cancer Maternal Aunt    Breast cancer Cousin 81    ALLERGIES:  has no known  allergies.  MEDICATIONS:  Current Outpatient Medications  Medication Sig Dispense Refill   Clobetasol  Propionate 0.05 % shampoo Apply 1 Application topically as directed. Wash scalp every other week, let sit 30 minutes on scalp and wash out with Ketoconazole  shampoo 118 mL 11   ferrous sulfate  ER (SLOW FE) 142 (45 Fe) MG TBCR tablet Take 1 tablet (45 mg of iron  total) by mouth daily. 30 tablet 5   hydrochlorothiazide  (HYDRODIURIL ) 25 MG tablet  Take 1 tablet (25 mg total) by mouth daily. 90 tablet 3   ketoconazole  (NIZORAL ) 2 % shampoo Apply 1 Application topically as directed. Wash scalp every other week, after applying Clobetasol  shampoo wash out with Ketoconazole  2% shampoo 120 mL 11   meloxicam  (MOBIC ) 7.5 MG tablet Take 1 tablet (7.5 mg total) by mouth daily as needed for pain. 90 tablet 1   metroNIDAZOLE  (FLAGYL ) 500 MG tablet Take 1 tablet (500 mg total) by mouth 2 (two) times daily. (Patient not taking: Reported on 07/10/2024) 14 tablet 0   No current facility-administered medications for this visit.    Review of Systems  Constitutional:  Negative for appetite change, chills, fatigue and fever.  HENT:   Negative for hearing loss and voice change.   Eyes:  Negative for eye problems.  Respiratory:  Negative for chest tightness and cough.   Cardiovascular:  Negative for chest pain.  Gastrointestinal:  Negative for abdominal distention, abdominal pain and blood in stool.  Endocrine: Negative for hot flashes.  Genitourinary:  Negative for difficulty urinating and frequency.   Musculoskeletal:  Negative for arthralgias.  Skin:  Negative for itching and rash.  Neurological:  Negative for extremity weakness.  Hematological:  Negative for adenopathy.  Psychiatric/Behavioral:  Negative for confusion.    PHYSICAL EXAMINATION:  Vitals:   07/10/24 1025  BP: (!) 126/98  Pulse: 70  Resp: 18  Temp: (!) 97 F (36.1 C)  SpO2: 100%   Filed Weights   07/10/24 1025  Weight: 200 lb 9.6 oz  (91 kg)    Physical Exam Constitutional:      General: She is not in acute distress. HENT:     Head: Normocephalic and atraumatic.  Eyes:     General: No scleral icterus. Cardiovascular:     Rate and Rhythm: Normal rate.  Pulmonary:     Effort: Pulmonary effort is normal. No respiratory distress.  Abdominal:     General: There is no distension.  Musculoskeletal:        General: No deformity. Normal range of motion.     Cervical back: Normal range of motion and neck supple.  Skin:    Findings: No erythema or rash.  Neurological:     Mental Status: She is alert and oriented to person, place, and time. Mental status is at baseline.  Psychiatric:        Mood and Affect: Mood normal.     LABORATORY DATA:  I have reviewed the data as listed    Latest Ref Rng & Units 07/07/2024    9:57 AM 01/07/2024    9:20 AM 07/02/2023   10:42 AM  CBC  WBC 4.0 - 10.5 K/uL 8.8  9.9  12.2   Hemoglobin 12.0 - 15.0 g/dL 88.5  89.1  88.3   Hematocrit 36.0 - 46.0 % 36.1  35.3  37.4   Platelets 150 - 400 K/uL 337  348  318       Latest Ref Rng & Units 07/02/2023   10:42 AM 04/04/2023   11:29 AM 12/28/2021    3:42 PM  CMP  Glucose 70 - 99 mg/dL 88  82  896   BUN 6 - 20 mg/dL 11  10  12    Creatinine 0.44 - 1.00 mg/dL 9.15  9.08  8.96   Sodium 135 - 145 mmol/L 139  141  140   Potassium 3.5 - 5.1 mmol/L 4.0  4.6  3.9   Chloride 98 - 111 mmol/L 108  101  103   CO2 22 - 32 mmol/L 23  23  26    Calcium 8.9 - 10.3 mg/dL 9.1  9.9  9.1   Total Protein 6.5 - 8.1 g/dL 7.5   7.1   Total Bilirubin 0.3 - 1.2 mg/dL 0.6   0.4   Alkaline Phos 38 - 126 U/L 59   66   AST 15 - 41 U/L 20   13   ALT 0 - 44 U/L 16   11       RADIOGRAPHIC STUDIES: I have personally reviewed the radiological images as listed and agreed with the findings in the report. No results found.

## 2024-07-10 NOTE — Assessment & Plan Note (Signed)
 Recommend patient to follow up with Gyn. Improved.

## 2024-07-15 ENCOUNTER — Encounter: Payer: Self-pay | Admitting: Family

## 2024-07-15 ENCOUNTER — Ambulatory Visit (INDEPENDENT_AMBULATORY_CARE_PROVIDER_SITE_OTHER): Admitting: Family

## 2024-07-15 VITALS — BP 118/80 | HR 71 | Temp 98.6°F | Ht 67.5 in | Wt 197.5 lb

## 2024-07-15 DIAGNOSIS — Z0000 Encounter for general adult medical examination without abnormal findings: Secondary | ICD-10-CM

## 2024-07-15 DIAGNOSIS — M542 Cervicalgia: Secondary | ICD-10-CM | POA: Diagnosis not present

## 2024-07-15 DIAGNOSIS — I10 Essential (primary) hypertension: Secondary | ICD-10-CM | POA: Diagnosis not present

## 2024-07-15 MED ORDER — HYDROCHLOROTHIAZIDE 25 MG PO TABS: 25.0000 mg | ORAL_TABLET | Freq: Every day | ORAL | 3 refills | Status: AC

## 2024-07-15 MED ORDER — MELOXICAM 7.5 MG PO TABS
7.5000 mg | ORAL_TABLET | Freq: Every day | ORAL | 1 refills | Status: AC | PRN
Start: 2024-07-15 — End: ?

## 2024-07-15 NOTE — Assessment & Plan Note (Signed)
 Chronic, stable. Continue hydrochlorothiazide  to 25 mg daily.

## 2024-07-15 NOTE — Progress Notes (Signed)
 Assessment & Plan:  Routine physical examination Assessment & Plan: Deferred CBE due to patient preference. She is following with Dr Rutherford for pap smear after Vaginal wall biopsy and cervix cone surgery 01/30/23 , Dr Arlee. Requesting records.  Encouraged exercise. Labs down with employer, [employer]; she will send them to me for my review.   Hypertension, unspecified type Assessment & Plan: Chronic, stable. Continue hydrochlorothiazide  to 25 mg daily.    Orders: -     hydroCHLOROthiazide ; Take 1 tablet (25 mg total) by mouth daily.  Dispense: 90 tablet; Refill: 3  Neck pain -     Meloxicam ; Take 1 tablet (7.5 mg total) by mouth daily as needed for pain.  Dispense: 90 tablet; Refill: 1     Return precautions given.   Risks, benefits, and alternatives of the medications and treatment plan prescribed today were discussed, and patient expressed understanding.   Education regarding symptom management and diagnosis given to patient on AVS either electronically or printed.  Return in about 6 months (around 01/15/2025).  Rollene Northern, FNP  Subjective:    Patient ID: Orvil Sharps, female    DOB: 07-Jul-1991, 33 y.o.   MRN: [id number]  CC: Lurine Wickes is a 33 y.o. female who presents today for physical exam.    HPI: HPI  Discussed the use of AI scribe software for clinical note transcription with the patient, who gave verbal consent to proceed.  History of Present Illness   Masa Kras is a 33 year old female with iron  deficiency anemia who presents for a physical and follow-up.  Overall feels well today No new complaints Tolerating slow-release iron  supplements, which do not cause constipation. She has also started a probiotic to aid with bowel regularity, as she typically has bowel movements once or twice a week. Dietary changes, such as consuming smoothies and salads, improve her bowel regularity. She has tried a probiotic drink called Poppy, which she found helpful in regular  bowl movements.  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   She is also on meloxicam  as needed.   For exercise, she enjoys walking and recently attended a dance class, which she found enjoyable.      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    Health Maintenance  Topic Date Due   Hepatitis B Vaccine (1 of 3 - 19+ 3-dose series) Never done   HPV Vaccine (1 - 3-dose SCDM series) Never done   COVID-19 Vaccine (4 - 2024-25 season) 08/05/2023   Flu Shot  03/03/2025*   DTaP/Tdap/Td vaccine (8 - Td or Tdap) 07/15/2025*   Pap with HPV screening  09/19/2028   Hepatitis C Screening  Completed   HIV Screening  Completed   Meningitis B Vaccine  Aged Out  *Topic was postponed. The date shown is not the original due date.    ALLERGIES: Patient has no known allergies.  Current Outpatient Medications on File Prior to  Visit  Medication Sig Dispense Refill   Clobetasol  Propionate 0.05 % shampoo Apply 1 Application topically as directed. Wash scalp every other week, let sit 30 minutes on scalp and wash out with Ketoconazole  shampoo 118 mL 11   ferrous sulfate  ER (SLOW FE) 142 (45 Fe) MG TBCR tablet Take 1 tablet (45 mg of iron  total) by mouth daily. 30 tablet 5   ketoconazole  (NIZORAL ) 2 % shampoo Apply 1 Application topically as directed. Wash scalp every other week, after applying Clobetasol  shampoo wash out with Ketoconazole  2% shampoo 120 mL 11    metroNIDAZOLE  (FLAGYL ) 500 MG tablet Take 1 tablet (500 mg total) by mouth 2 (two) times daily. (Patient not taking: Reported on 07/15/2024) 14 tablet 0   No current facility-administered medications on file prior to visit.    Review of Systems  Constitutional:  Negative for chills, fever and unexpected weight change.  HENT:  Negative for congestion.   Respiratory:  Negative for cough.   Cardiovascular:  Negative for chest pain, palpitations and leg swelling.  Gastrointestinal:  Negative for nausea and vomiting.  Musculoskeletal:  Negative for arthralgias and myalgias.  Skin:  Negative for rash.  Neurological:  Negative for headaches.  Hematological:  Negative for adenopathy.  Psychiatric/Behavioral:  Negative for confusion.       Objective:    BP 118/80   Pulse 71   Temp 98.6 F (37 C) (Oral)   Ht 5' 7.5 (1.715 m)   Wt 197 lb 8 oz (89.6 kg)   LMP  (LMP Unknown)   SpO2 99%   BMI 30.48 kg/m   BP Readings from Last 3 Encounters:  07/15/24 118/80  07/10/24 (!) 126/98  01/31/24 (!) 147/82   Wt Readings from Last 3 Encounters:  07/15/24 197 lb 8 oz (89.6 kg)  07/10/24 200 lb 9.6 oz (91 kg)  01/10/24 200 lb 9.6 oz (91 kg)    Physical Exam Vitals reviewed.  Constitutional:      Appearance: Normal appearance. She is well-developed.  Eyes:     Conjunctiva/sclera: Conjunctivae normal.  Neck:     Thyroid: No thyroid mass or thyromegaly.  Cardiovascular:     Rate and Rhythm: Normal rate and regular rhythm.     Pulses: Normal pulses.     Heart sounds: Normal heart sounds.  Pulmonary:     Effort: Pulmonary effort is normal.     Breath sounds: Normal breath sounds. No wheezing, rhonchi or rales.  Abdominal:     General: Bowel sounds are normal. There is no distension.     Palpations: Abdomen is soft. Abdomen is not rigid. There is no fluid wave or mass.     Tenderness: There is no abdominal tenderness. There is no guarding or rebound.  Lymphadenopathy:     Head:      Right side of head: No submental, submandibular, tonsillar, preauricular, posterior auricular or occipital adenopathy.     Left side of head: No submental, submandibular, tonsillar, preauricular, posterior auricular or occipital adenopathy.     Cervical: No cervical adenopathy.  Skin:    General: Skin is warm and dry.  Neurological:     Mental Status: She is alert.  Psychiatric:        Speech: Speech normal.        Behavior: Behavior normal.        Thought Content: Thought content normal.

## 2024-07-15 NOTE — Assessment & Plan Note (Signed)
 Deferred CBE due to patient preference. She is following with Dr Rutherford for pap smear after Vaginal wall biopsy and cervix cone surgery 01/30/23 , Dr Arlee. Requesting records.  Encouraged exercise. Labs down with employer, [facility]; she will send them to me for my review.

## 2024-07-15 NOTE — Patient Instructions (Addendum)
 Send me copies of Labs from [facility] when you can   Health Maintenance, Female Adopting a healthy lifestyle and getting preventive care are important in promoting health and wellness. Ask your health care provider about: The right schedule for you to have regular tests and exams. Things you can do on your own to prevent diseases and keep yourself healthy. What should I know about diet, weight, and exercise? Eat a healthy diet  Eat a diet that includes plenty of vegetables, fruits, low-fat dairy products, and lean protein. Do not eat a lot of foods that are high in solid fats, added sugars, or sodium. Maintain a healthy weight Body mass index (BMI) is used to identify weight problems. It estimates body fat based on height and weight. Your health care provider can help determine your BMI and help you achieve or maintain a healthy weight. Get regular exercise Get regular exercise. This is one of the most important things you can do for your health. Most adults should: Exercise for at least 150 minutes each week. The exercise should increase your heart rate and make you sweat (moderate-intensity exercise). Do strengthening exercises at least twice a week. This is in addition to the moderate-intensity exercise. Spend less time sitting. Even light physical activity can be beneficial. Watch cholesterol and blood lipids Have your blood tested for lipids and cholesterol at 35 years of age, then have this test every 5 years. Have your cholesterol levels checked more often if: Your lipid or cholesterol levels are high. You are older than 35 years of age. You are at high risk for heart disease. What should I know about cancer screening? Depending on your health history and family history, you may need to have cancer screening at various ages. This may include screening for: Breast cancer. Cervical cancer. Colorectal cancer. Skin cancer. Lung cancer. What should I know about heart disease,  diabetes, and high blood pressure? Blood pressure and heart disease High blood pressure causes heart disease and increases the risk of stroke. This is more likely to develop in people who have high blood pressure readings or are overweight. Have your blood pressure checked: Every 3-5 years if you are 25-20 years of age. Every year if you are 52 years old or older. Diabetes Have regular diabetes screenings. This checks your fasting blood sugar level. Have the screening done: Once every three years after age 73 if you are at a normal weight and have a low risk for diabetes. More often and at a younger age if you are overweight or have a high risk for diabetes. What should I know about preventing infection? Hepatitis B If you have a higher risk for hepatitis B, you should be screened for this virus. Talk with your health care provider to find out if you are at risk for hepatitis B infection. Hepatitis C Testing is recommended for: Everyone born from [birth year] through 1967. Anyone with known risk factors for hepatitis C. Sexually transmitted infections (STIs) Get screened for STIs, including gonorrhea and chlamydia, if: You are sexually active and are younger than 35 years of age. You are older than 35 years of age and your health care provider tells you that you are at risk for this type of infection. Your sexual activity has changed since you were last screened, and you are at increased risk for chlamydia or gonorrhea. Ask your health care provider if you are at risk. Ask your health care provider about whether you are at high risk for HIV. Your  health care provider may recommend a prescription medicine to help prevent HIV infection. If you choose to take medicine to prevent HIV, you should first get tested for HIV. You should then be tested every 3 months for as long as you are taking the medicine. Pregnancy If you are about to stop having your period (premenopausal) and you may become pregnant,  seek counseling before you get pregnant. Take 400 to 800 micrograms (mcg) of folic acid every day if you become pregnant. Ask for birth control (contraception) if you want to prevent pregnancy. Osteoporosis and menopause Osteoporosis is a disease in which the bones lose minerals and strength with aging. This can result in bone fractures. If you are 81 years old or older, or if you are at risk for osteoporosis and fractures, ask your health care provider if you should: Be screened for bone loss. Take a calcium or vitamin D  supplement to lower your risk of fractures. Be given hormone replacement therapy (HRT) to treat symptoms of menopause. Follow these instructions at home: Alcohol use Do not drink alcohol if: Your health care provider tells you not to drink. You are pregnant, may be pregnant, or are planning to become pregnant. If you drink alcohol: Limit how much you have to: 0-1 drink a day. Know how much alcohol is in your drink. In the [place] one drink equals one 12 oz bottle of beer (355 mL), one 5 oz glass of wine (148 mL), or one 1 oz glass of hard liquor (44 mL). Lifestyle Do not use any products that contain nicotine or tobacco. These products include cigarettes, chewing tobacco, and vaping devices, such as e-cigarettes. If you need help quitting, ask your health care provider. Do not use street drugs. Do not share needles. Ask your health care provider for help if you need support or information about quitting drugs. General instructions Schedule regular health, dental, and eye exams. Stay current with your vaccines. Tell your health care provider if: You often feel depressed. You have ever been abused or do not feel safe at home. Summary Adopting a healthy lifestyle and getting preventive care are important in promoting health and wellness. Follow your health care provider's instructions about healthy diet, exercising, and getting tested or screened for diseases. Follow your  health care provider's instructions on monitoring your cholesterol and blood pressure. This information is not intended to replace advice given to you by your health care provider. Make sure you discuss any questions you have with your health care provider. Document Revised: 04/11/2021 Document Reviewed: 04/11/2021 Elsevier Patient Education  2024 [employer].

## 2024-12-24 ENCOUNTER — Ambulatory Visit: Payer: Managed Care, Other (non HMO) | Admitting: Dermatology

## 2025-01-02 ENCOUNTER — Telehealth: Payer: Self-pay | Admitting: Oncology

## 2025-01-02 NOTE — Telephone Encounter (Signed)
 Called pt to r/s lab from 2/2 - pt confirmed new date/time - LH

## 2025-01-05 ENCOUNTER — Inpatient Hospital Stay

## 2025-01-06 ENCOUNTER — Inpatient Hospital Stay

## 2025-01-06 DIAGNOSIS — D500 Iron deficiency anemia secondary to blood loss (chronic): Secondary | ICD-10-CM

## 2025-01-06 LAB — CBC WITH DIFFERENTIAL (CANCER CENTER ONLY)
Abs Immature Granulocytes: 0.03 10*3/uL (ref 0.00–0.07)
Basophils Absolute: 0 10*3/uL (ref 0.0–0.1)
Basophils Relative: 0 %
Eosinophils Absolute: 0 10*3/uL (ref 0.0–0.5)
Eosinophils Relative: 0 %
HCT: 35.8 % — ABNORMAL LOW (ref 36.0–46.0)
Hemoglobin: 11.2 g/dL — ABNORMAL LOW (ref 12.0–15.0)
Immature Granulocytes: 0 %
Lymphocytes Relative: 34 %
Lymphs Abs: 2.7 10*3/uL (ref 0.7–4.0)
MCH: 23 pg — ABNORMAL LOW (ref 26.0–34.0)
MCHC: 31.3 g/dL (ref 30.0–36.0)
MCV: 73.7 fL — ABNORMAL LOW (ref 80.0–100.0)
Monocytes Absolute: 0.6 10*3/uL (ref 0.1–1.0)
Monocytes Relative: 7 %
Neutro Abs: 4.7 10*3/uL (ref 1.7–7.7)
Neutrophils Relative %: 59 %
Platelet Count: 338 10*3/uL (ref 150–400)
RBC: 4.86 MIL/uL (ref 3.87–5.11)
RDW: 15.2 % (ref 11.5–15.5)
WBC Count: 8.1 10*3/uL (ref 4.0–10.5)
nRBC: 0 % (ref 0.0–0.2)

## 2025-01-06 LAB — IRON AND TIBC
Iron: 87 ug/dL (ref 28–170)
Saturation Ratios: 24 % (ref 10.4–31.8)
TIBC: 364 ug/dL (ref 250–450)
UIBC: 277 ug/dL

## 2025-01-06 LAB — FERRITIN: Ferritin: 26 ng/mL (ref 11–307)

## 2025-01-08 ENCOUNTER — Inpatient Hospital Stay: Admitting: Oncology

## 2025-01-08 ENCOUNTER — Encounter: Payer: Self-pay | Admitting: Oncology

## 2025-01-08 VITALS — BP 139/102 | HR 85 | Temp 97.0°F | Resp 18 | Wt 200.4 lb

## 2025-01-08 DIAGNOSIS — N920 Excessive and frequent menstruation with regular cycle: Secondary | ICD-10-CM

## 2025-01-08 DIAGNOSIS — D500 Iron deficiency anemia secondary to blood loss (chronic): Secondary | ICD-10-CM

## 2025-01-08 NOTE — Progress Notes (Signed)
 " Hematology/Oncology Progress note [phone] [phone]        CHIEF COMPLAINTS/REASON FOR VISIT:  Follow up for iron  deficiency anemia.    ASSESSMENT & PLAN:   Iron  deficiency anemia due to chronic blood loss #Iron  deficiency anemia, Labs reviewed and discussed with patient. Lab Results  Component Value Date   HGB 11.2 (L) 01/06/2025   TIBC 364 01/06/2025   IRONPCTSAT 24 01/06/2025   FERRITIN 26 01/06/2025    Stable hemoglobin and ferritin.  No need of Venofer  Slow Fe 45mg  daily with vitamin c  supplementation.   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 Improved.    Orders Placed This Encounter  Procedures   CBC with Differential (Cancer Center Only)    Standing Status:   Future    Expiration Date:   01/08/2026   Iron  and TIBC   Ferritin    Standing Status:   Future    Expiration Date:   01/08/2026   Follow up in 12 months.  All questions were answered. The patient knows to call the clinic with any problems, questions or concerns.  Zelphia Cap, MD, PhD [facility] Hematology Oncology 01/08/2025   HISTORY OF PRESENTING ILLNESS:   Kim Reeves is a  34 y.o.  female presents for follow up of iron  deficiency anemia.   She reports energy levels are pretty good but acknowledges ongoing iron  deficiency with current hemoglobin at 11.1, which is lower than 6 months prior, iron  saturation of 8, and ferritin of 7. - Underwent Vaginal wall biopsy and cervix cone surgery 01/30/23  and experienced prolonged bleeding postoperatively. - First menstrual cycle post-surgery was heavier than usual, which was expected according to the gynecologist.   INTERVAL HISTORY Kim Reeves is a 34 y.o. female who has above history reviewed by me today presents for follow up visit for ; iron  deficiency anemia Patient takes oral iron  supplementation slow Fe. She tolerates Slow Fe.  No new complaints. Menstrual  period flow is not as heavy.   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    SURGICAL HISTORY: Past Surgical History:  Procedure Laterality Date   CERVIX SURGERY     NO PAST SURGERIES      SOCIAL HISTORY: Social History   Socioeconomic History   Marital status: Single    Spouse name: Not on file   Number of children: 0   Years of education: Not on file   Highest education level: Not on file  Occupational History   Not on file  Tobacco Use   Smoking status: Never   Smokeless tobacco: Never  Vaping Use   Vaping status: Never Used  Substance and Sexual Activity   Alcohol use: Yes    Comment: socially   Drug use: Not Currently    Types: Other-see comments    Comment: Etables    Sexual activity: Yes    Birth control/protection: Condom  Other  Topics Concern   Not on file  Social History Narrative   Originally from [place]      Wants to apply to [employer] for [employer] studies      Works as air cabin crew      Recent break up 10/2016      Social Drivers of Health   Tobacco Use: Low Risk (01/08/2025)   Patient History    Smoking Tobacco Use: Never    Smokeless Tobacco Use: Never    Passive Exposure: Not on file  Financial Resource Strain: Low Risk (04/30/2024)   Received from [facility]   Overall Financial Resource Strain (CARDIA)    Difficulty of Paying Living Expenses: Not very hard  Food Insecurity: No Food Insecurity (04/30/2024)   Received from [facility]   Epic    Within the past 12 months, you worried  that your food would run out before you got the money to buy more.: Never true    Within the past 12 months, the food you bought just didn't last and you didn't have money to get more.: Never true  Transportation Needs: No Transportation Needs (04/30/2024)   Received from [facility] - Transportation    Lack of Transportation (Medical): No    Lack of Transportation (Non-Medical): No  Physical Activity: Insufficiently Active (04/30/2024)   Received from [facility]   Exercise Vital Sign    On average, how many days per week do you engage in moderate to strenuous exercise (like a brisk walk)?: 3 days    On average, how many minutes do you engage in exercise at this level?: 30 min  Stress: No Stress Concern Present (04/30/2024)   Received from [facility] - Occupational Stress Questionnaire    Feeling of Stress : Only a little  Social Connections: Socially Integrated (04/30/2024)   Received from [facility]   Social Network    How would you rate your social network (family, work, friends)?: Good participation with social networks  Intimate Partner Violence: Not At Risk (04/30/2024)   Received from [facility]   HITS    Over the last 12 months how often did your partner physically hurt you?: Never    Over the last 12 months how often did your partner insult you or talk down to you?: Never    Over the last 12 months how often did your partner threaten you with physical harm?: Never    Over the last 12 months how often did your partner scream or curse at you?: Never  Depression (PHQ2-9): Low Risk (07/15/2024)   Depression (PHQ2-9)    PHQ-2 Score: 1  Alcohol Screen: Not on file  Housing: Low Risk (04/30/2024)   Received from [facility]    In the last 12 months, was there a time when you were not able to pay the mortgage or rent on time?: No    In the past 12 months, how many times have you moved where you were living?: 0     At any time in the past 12 months, were you homeless or living in a shelter (including now)?: No  Utilities: Not At Risk (04/30/2024)   Received from [facility] Utilities    Threatened with loss of utilities: No  Health Literacy: Not on file    FAMILY HISTORY: Family History  Problem Relation Age of Onset   Colon cancer Maternal Grandmother 60   Breast  cancer Maternal Grandmother 6   Breast cancer Paternal Aunt 39   Colon cancer Maternal Aunt 52   Pancreatic cancer Maternal Aunt    Breast cancer Cousin 66    ALLERGIES:  has no known allergies.  MEDICATIONS:  Current Outpatient Medications  Medication Sig Dispense Refill   Clobetasol  Propionate 0.05 % shampoo Apply 1 Application topically as directed. Wash scalp every other week, let sit 30 minutes on scalp and wash out with Ketoconazole  shampoo 118 mL 11   ferrous sulfate  ER (SLOW FE) 142 (45 Fe) MG TBCR tablet Take 1 tablet (45 mg of iron  total) by mouth daily. 30 tablet 5   hydrochlorothiazide  (HYDRODIURIL ) 25 MG tablet Take 1 tablet (25 mg total) by mouth daily. 90 tablet 3   ketoconazole  (NIZORAL ) 2 % shampoo Apply 1 Application topically as directed. Wash scalp every other week, after applying Clobetasol  shampoo wash out with Ketoconazole  2% shampoo 120 mL 11   meloxicam  (MOBIC ) 7.5 MG tablet Take 1 tablet (7.5 mg total) by mouth daily as needed for pain. 90 tablet 1   No current facility-administered medications for this visit.    Review of Systems  Constitutional:  Negative for appetite change, chills, fatigue and fever.  HENT:   Negative for hearing loss and voice change.   Eyes:  Negative for eye problems.  Respiratory:  Negative for chest tightness and cough.   Cardiovascular:  Negative for chest pain.  Gastrointestinal:  Negative for abdominal distention, abdominal pain and blood in stool.  Endocrine: Negative for hot flashes.  Genitourinary:  Negative for difficulty urinating and frequency.    Musculoskeletal:  Negative for arthralgias.  Skin:  Negative for itching and rash.  Neurological:  Negative for extremity weakness.  Hematological:  Negative for adenopathy.  Psychiatric/Behavioral:  Negative for confusion.    PHYSICAL EXAMINATION:  Vitals:   01/08/25 1034 01/08/25 1038  BP: (!) 127/100 (!) 139/102  Pulse: 85   Resp: 18   Temp: (!) 97 F (36.1 C)   SpO2: 99%    Filed Weights   01/08/25 1034  Weight: 200 lb 6.4 oz (90.9 kg)    Physical Exam Constitutional:      General: She is not in acute distress. HENT:     Head: Normocephalic and atraumatic.  Eyes:     General: No scleral icterus. Cardiovascular:     Rate and Rhythm: Normal rate.  Pulmonary:     Effort: Pulmonary effort is normal. No respiratory distress.  Abdominal:     General: There is no distension.  Musculoskeletal:        General: No deformity. Normal range of motion.     Cervical back: Normal range of motion and neck supple.  Skin:    Findings: No erythema or rash.  Neurological:     Mental Status: She is alert and oriented to person, place, and time. Mental status is at baseline.  Psychiatric:        Mood and Affect: Mood normal.     LABORATORY DATA:  I have reviewed the data as listed    Latest Ref Rng & Units 01/06/2025   11:56 AM 07/07/2024    9:57 AM 01/07/2024    9:20 AM  CBC  WBC 4.0 - 10.5 K/uL 8.1  8.8  9.9   Hemoglobin 12.0 - 15.0 g/dL 88.7  88.5  89.1   Hematocrit 36.0 - 46.0 % 35.8  36.1  35.3   Platelets 150 - 400 K/uL 338  337  348  Latest Ref Rng & Units 07/02/2023   10:42 AM 04/04/2023   11:29 AM 12/28/2021    3:42 PM  CMP  Glucose 70 - 99 mg/dL 88  82  896   BUN 6 - 20 mg/dL 11  10  12    Creatinine 0.44 - 1.00 mg/dL 9.15  9.08  8.96   Sodium 135 - 145 mmol/L 139  141  140   Potassium 3.5 - 5.1 mmol/L 4.0  4.6  3.9   Chloride 98 - 111 mmol/L 108  101  103   CO2 22 - 32 mmol/L 23  23  26    Calcium 8.9 - 10.3 mg/dL 9.1  9.9  9.1   Total Protein 6.5 - 8.1  g/dL 7.5   7.1   Total Bilirubin 0.3 - 1.2 mg/dL 0.6   0.4   Alkaline Phos 38 - 126 U/L 59   66   AST 15 - 41 U/L 20   13   ALT 0 - 44 U/L 16   11       RADIOGRAPHIC STUDIES: I have personally reviewed the radiological images as listed and agreed with the findings in the report. No results found.       "

## 2025-01-08 NOTE — Assessment & Plan Note (Addendum)
#  Iron  deficiency anemia, Labs reviewed and discussed with patient. Lab Results  Component Value Date   HGB 11.2 (L) 01/06/2025   TIBC 364 01/06/2025   IRONPCTSAT 24 01/06/2025   FERRITIN 26 01/06/2025    Stable hemoglobin and ferritin.  No need of Venofer  Slow Fe 45mg  daily with vitamin c  supplementation.   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

## 2025-01-08 NOTE — Assessment & Plan Note (Signed)
 Recommend patient to follow up with Gyn. Improved.

## 2025-01-15 ENCOUNTER — Ambulatory Visit: Admitting: Family

## 2026-01-08 ENCOUNTER — Inpatient Hospital Stay

## 2026-01-15 ENCOUNTER — Inpatient Hospital Stay: Admitting: Oncology

## 2026-01-15 ENCOUNTER — Inpatient Hospital Stay
